# Patient Record
Sex: Male | Born: 1960 | Race: White | Hispanic: No | Marital: Married | Smoking: Never smoker
Health system: Southern US, Community
[De-identification: ages and names within clinical notes are randomized; demographics above are authoritative.]

## PROBLEM LIST (undated history)

## (undated) DIAGNOSIS — I499 Cardiac arrhythmia, unspecified: Secondary | ICD-10-CM

## (undated) DIAGNOSIS — G709 Myoneural disorder, unspecified: Secondary | ICD-10-CM

## (undated) DIAGNOSIS — M1990 Unspecified osteoarthritis, unspecified site: Secondary | ICD-10-CM

## (undated) DIAGNOSIS — Z9289 Personal history of other medical treatment: Secondary | ICD-10-CM

## (undated) DIAGNOSIS — I10 Essential (primary) hypertension: Secondary | ICD-10-CM

## (undated) DIAGNOSIS — R0683 Snoring: Secondary | ICD-10-CM

## (undated) DIAGNOSIS — K529 Noninfective gastroenteritis and colitis, unspecified: Secondary | ICD-10-CM

## (undated) DIAGNOSIS — K5790 Diverticulosis of intestine, part unspecified, without perforation or abscess without bleeding: Secondary | ICD-10-CM

## (undated) DIAGNOSIS — E785 Hyperlipidemia, unspecified: Secondary | ICD-10-CM

## (undated) HISTORY — PX: TONSILLECTOMY: SUR1361

## (undated) HISTORY — DX: Essential (primary) hypertension: I10

## (undated) HISTORY — DX: Hyperlipidemia, unspecified: E78.5

## (undated) HISTORY — DX: Diverticulosis of intestine, part unspecified, without perforation or abscess without bleeding: K57.90

## (undated) HISTORY — DX: Noninfective gastroenteritis and colitis, unspecified: K52.9

---

## 1991-10-15 HISTORY — PX: OPEN REDUCTION INTERNAL FIXATION (ORIF) FOOT LISFRANC FRACTURE: SHX5990

## 1997-10-14 HISTORY — PX: ANTERIOR CRUCIATE LIGAMENT REPAIR: SHX115

## 2005-10-14 HISTORY — PX: KNEE ARTHROSCOPY: SUR90

## 2006-09-16 ENCOUNTER — Ambulatory Visit: Payer: Self-pay | Admitting: Family Medicine

## 2006-09-22 ENCOUNTER — Ambulatory Visit: Payer: Self-pay | Admitting: Family Medicine

## 2006-09-22 LAB — CONVERTED CEMR LAB
Chol/HDL Ratio, serum: 4.9
Cholesterol: 180 mg/dL (ref 0–200)
Glucose, Bld: 92 mg/dL (ref 70–99)
HDL: 37.1 mg/dL — ABNORMAL LOW (ref >39.0)
LDL Cholesterol: 131 mg/dL — ABNORMAL HIGH (ref 0–99)
PSA: 0.58 ng/mL (ref 0.10–4.00)
Triglyceride fasting, serum: 60 mg/dL (ref 0–149)
VLDL: 12 mg/dL (ref 0–40)

## 2007-08-26 ENCOUNTER — Ambulatory Visit: Payer: Self-pay | Admitting: Family Medicine

## 2007-08-26 DIAGNOSIS — I10 Essential (primary) hypertension: Secondary | ICD-10-CM | POA: Insufficient documentation

## 2007-08-26 DIAGNOSIS — R9431 Abnormal electrocardiogram [ECG] [EKG]: Secondary | ICD-10-CM

## 2007-08-26 DIAGNOSIS — R209 Unspecified disturbances of skin sensation: Secondary | ICD-10-CM

## 2007-08-26 DIAGNOSIS — K648 Other hemorrhoids: Secondary | ICD-10-CM | POA: Insufficient documentation

## 2007-08-28 ENCOUNTER — Telehealth (INDEPENDENT_AMBULATORY_CARE_PROVIDER_SITE_OTHER): Payer: Self-pay | Admitting: *Deleted

## 2007-08-28 ENCOUNTER — Encounter (INDEPENDENT_AMBULATORY_CARE_PROVIDER_SITE_OTHER): Payer: Self-pay | Admitting: *Deleted

## 2007-08-28 DIAGNOSIS — K5730 Diverticulosis of large intestine without perforation or abscess without bleeding: Secondary | ICD-10-CM | POA: Insufficient documentation

## 2007-09-17 ENCOUNTER — Ambulatory Visit: Payer: Self-pay | Admitting: Cardiology

## 2007-09-24 ENCOUNTER — Ambulatory Visit: Payer: Self-pay | Admitting: Cardiology

## 2007-09-24 LAB — CONVERTED CEMR LAB
ALT: 19 units/L (ref 0–53)
AST: 22 units/L (ref 0–37)
Albumin: 4 g/dL (ref 3.5–5.2)
Alkaline Phosphatase: 56 units/L (ref 39–117)
BUN: 17 mg/dL (ref 6–23)
Bilirubin, Direct: 0.1 mg/dL (ref 0.0–0.3)
CO2: 27 meq/L (ref 19–32)
Calcium: 9.1 mg/dL (ref 8.4–10.5)
Chloride: 103 meq/L (ref 96–112)
Cholesterol: 163 mg/dL (ref 0–200)
Creatinine, Ser: 1.2 mg/dL (ref 0.4–1.5)
GFR calc Af Amer: 85 mL/min
GFR calc non Af Amer: 70 mL/min
Glucose, Bld: 100 mg/dL — ABNORMAL HIGH (ref 70–99)
HDL: 27.6 mg/dL — ABNORMAL LOW (ref >39.0)
LDL Cholesterol: 124 mg/dL — ABNORMAL HIGH (ref 0–99)
Potassium: 3.9 meq/L (ref 3.5–5.1)
Sodium: 136 meq/L (ref 135–145)
Total Bilirubin: 1.1 mg/dL (ref 0.3–1.2)
Total CHOL/HDL Ratio: 5.9
Total Protein: 6.7 g/dL (ref 6.0–8.3)
Triglycerides: 55 mg/dL (ref 0–149)
VLDL: 11 mg/dL (ref 0–40)

## 2007-09-29 ENCOUNTER — Ambulatory Visit: Payer: Self-pay | Admitting: Family Medicine

## 2007-09-29 ENCOUNTER — Telehealth (INDEPENDENT_AMBULATORY_CARE_PROVIDER_SITE_OTHER): Payer: Self-pay | Admitting: Family Medicine

## 2007-09-29 ENCOUNTER — Encounter (INDEPENDENT_AMBULATORY_CARE_PROVIDER_SITE_OTHER): Payer: Self-pay | Admitting: *Deleted

## 2007-09-29 ENCOUNTER — Encounter: Admission: RE | Admit: 2007-09-29 | Discharge: 2007-09-29 | Payer: Self-pay | Admitting: Family Medicine

## 2007-09-29 DIAGNOSIS — J069 Acute upper respiratory infection, unspecified: Secondary | ICD-10-CM | POA: Insufficient documentation

## 2007-09-29 DIAGNOSIS — N453 Epididymo-orchitis: Secondary | ICD-10-CM | POA: Insufficient documentation

## 2007-09-29 LAB — CONVERTED CEMR LAB
Bilirubin Urine: NEGATIVE
Glucose, Urine, Semiquant: NEGATIVE
Inflenza A Ag: NEGATIVE
Nitrite: POSITIVE
Protein, U semiquant: 30
Specific Gravity, Urine: >=1.03
Urobilinogen, UA: 2
pH: 7.5

## 2007-09-30 ENCOUNTER — Telehealth (INDEPENDENT_AMBULATORY_CARE_PROVIDER_SITE_OTHER): Payer: Self-pay | Admitting: *Deleted

## 2007-10-02 ENCOUNTER — Ambulatory Visit: Payer: Self-pay | Admitting: Family Medicine

## 2007-10-02 ENCOUNTER — Ambulatory Visit: Payer: Self-pay

## 2007-10-05 ENCOUNTER — Ambulatory Visit: Payer: Self-pay | Admitting: Family Medicine

## 2007-10-05 LAB — CONVERTED CEMR LAB
Bilirubin Urine: NEGATIVE
Blood in Urine, dipstick: NEGATIVE
Glucose, Urine, Semiquant: NEGATIVE
Ketones, urine, test strip: NEGATIVE
Nitrite: NEGATIVE
Protein, U semiquant: NEGATIVE
Specific Gravity, Urine: 1.02
Urobilinogen, UA: NEGATIVE
WBC Urine, dipstick: NEGATIVE
pH: 6.5

## 2007-10-13 ENCOUNTER — Encounter (INDEPENDENT_AMBULATORY_CARE_PROVIDER_SITE_OTHER): Payer: Self-pay | Admitting: *Deleted

## 2007-10-20 ENCOUNTER — Ambulatory Visit: Payer: Self-pay | Admitting: Family Medicine

## 2007-10-21 ENCOUNTER — Encounter (INDEPENDENT_AMBULATORY_CARE_PROVIDER_SITE_OTHER): Payer: Self-pay | Admitting: Family Medicine

## 2007-10-21 LAB — CONVERTED CEMR LAB
Bilirubin Urine: NEGATIVE
Hemoglobin, Urine: NEGATIVE
Ketones, ur: NEGATIVE mg/dL
Leukocytes, UA: NEGATIVE
Nitrite: NEGATIVE
Protein, ur: NEGATIVE mg/dL
Specific Gravity, Urine: 1.009 (ref 1.005–1.03)
Urine Glucose: NEGATIVE mg/dL
Urobilinogen, UA: 0.2 (ref 0.0–1.0)
pH: 8 (ref 5.0–8.0)

## 2007-10-22 ENCOUNTER — Telehealth (INDEPENDENT_AMBULATORY_CARE_PROVIDER_SITE_OTHER): Payer: Self-pay | Admitting: *Deleted

## 2007-10-23 ENCOUNTER — Encounter (INDEPENDENT_AMBULATORY_CARE_PROVIDER_SITE_OTHER): Payer: Self-pay | Admitting: *Deleted

## 2007-10-28 ENCOUNTER — Ambulatory Visit: Payer: Self-pay | Admitting: Family Medicine

## 2007-10-28 DIAGNOSIS — J019 Acute sinusitis, unspecified: Secondary | ICD-10-CM

## 2007-11-12 ENCOUNTER — Ambulatory Visit: Payer: Self-pay | Admitting: Cardiology

## 2008-07-01 ENCOUNTER — Ambulatory Visit: Payer: Self-pay | Admitting: Family Medicine

## 2008-07-01 DIAGNOSIS — J1189 Influenza due to unidentified influenza virus with other manifestations: Secondary | ICD-10-CM

## 2008-07-01 LAB — CONVERTED CEMR LAB
Inflenza A Ag: POSITIVE
Influenza B Ag: POSITIVE

## 2008-07-25 ENCOUNTER — Telehealth (INDEPENDENT_AMBULATORY_CARE_PROVIDER_SITE_OTHER): Payer: Self-pay | Admitting: *Deleted

## 2008-10-10 ENCOUNTER — Ambulatory Visit (HOSPITAL_BASED_OUTPATIENT_CLINIC_OR_DEPARTMENT_OTHER): Admission: RE | Admit: 2008-10-10 | Discharge: 2008-10-10 | Payer: Self-pay | Admitting: Orthopedic Surgery

## 2008-10-14 DIAGNOSIS — Z9289 Personal history of other medical treatment: Secondary | ICD-10-CM

## 2008-10-14 HISTORY — DX: Personal history of other medical treatment: Z92.89

## 2009-01-24 DIAGNOSIS — E785 Hyperlipidemia, unspecified: Secondary | ICD-10-CM | POA: Insufficient documentation

## 2009-02-20 ENCOUNTER — Telehealth: Payer: Self-pay | Admitting: Cardiology

## 2009-03-17 ENCOUNTER — Ambulatory Visit: Payer: Self-pay | Admitting: Family Medicine

## 2009-03-17 DIAGNOSIS — J309 Allergic rhinitis, unspecified: Secondary | ICD-10-CM | POA: Insufficient documentation

## 2009-04-05 ENCOUNTER — Ambulatory Visit: Payer: Self-pay | Admitting: Cardiology

## 2009-04-06 ENCOUNTER — Encounter (INDEPENDENT_AMBULATORY_CARE_PROVIDER_SITE_OTHER): Payer: Self-pay | Admitting: *Deleted

## 2009-04-12 ENCOUNTER — Ambulatory Visit: Payer: Self-pay

## 2009-04-12 ENCOUNTER — Encounter: Payer: Self-pay | Admitting: Cardiology

## 2009-06-08 IMAGING — US US SCROTUM
1 series · 13 of 25 positions shown · non-contrast
Comparison: none

CLINICAL DATA: Scrotal pain. 
 SCROTAL ULTRASOUND:
TECHNIQUE: Complete ultrasound examination of the testicles, epididymis, and other scrotal structures was performed.
TECHNIQUE: Color and duplex Doppler ultrasound was utilized to evaluate blood flow to the testicles.

[Series 1: us scrotum · 0.08mm/px · 13 of 55 slices shown]
[im 1/55]
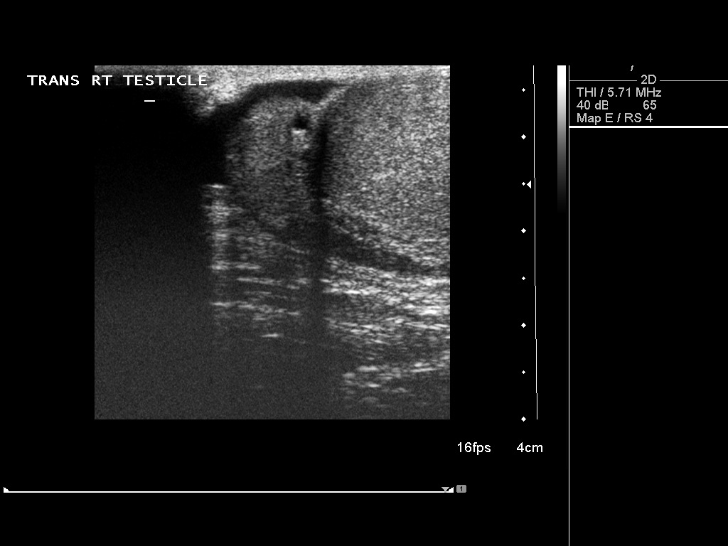
[im 5/55]
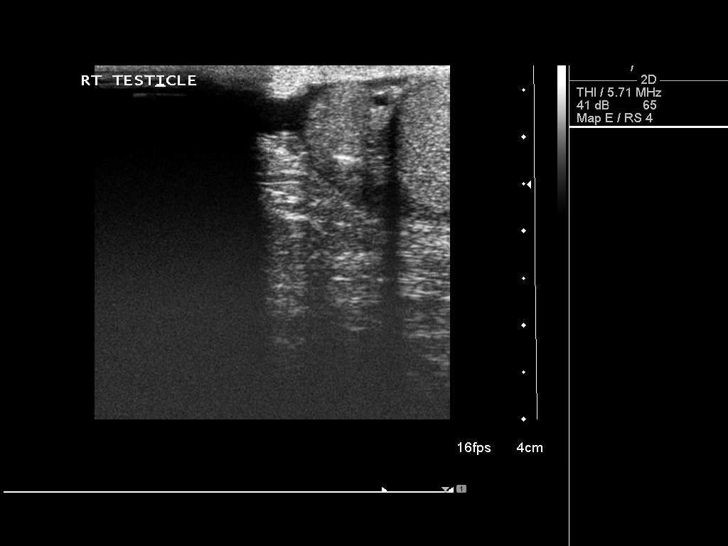
[im 10/55]
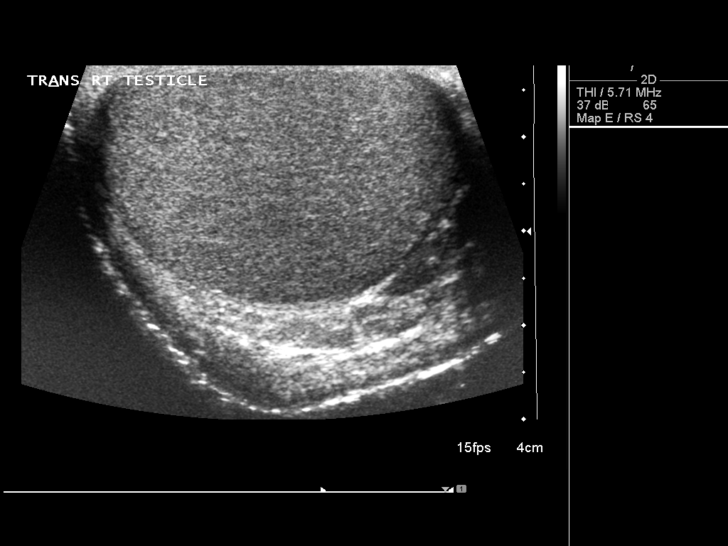
[im 14/55]
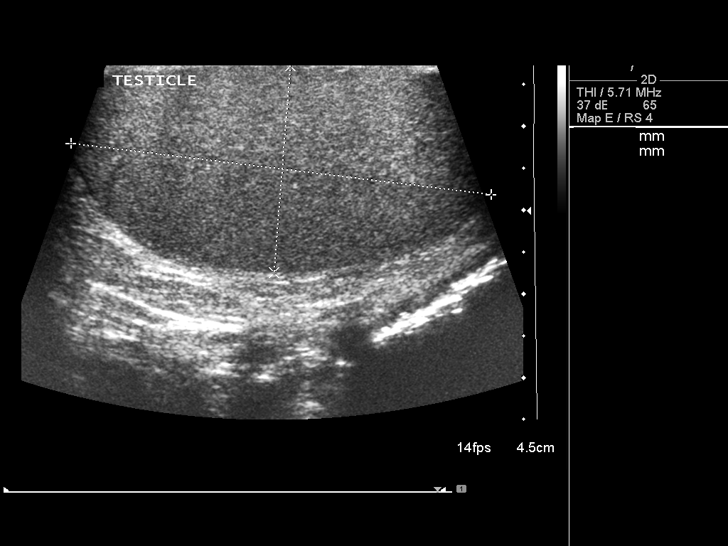
[im 19/55]
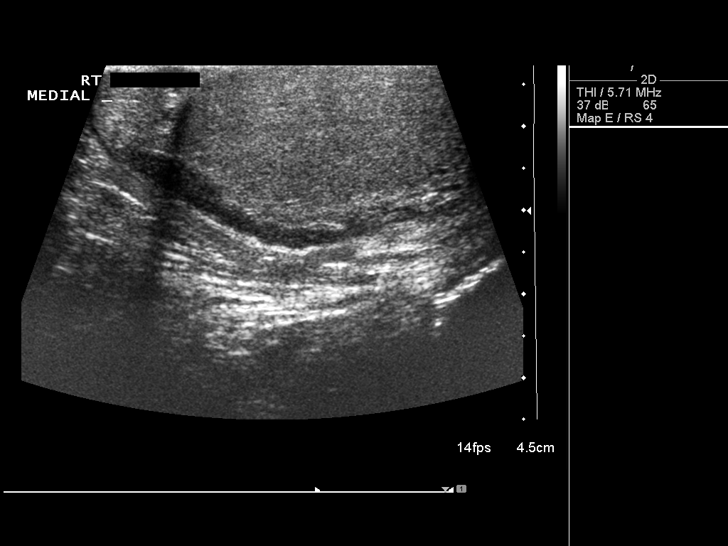
[im 23/55]
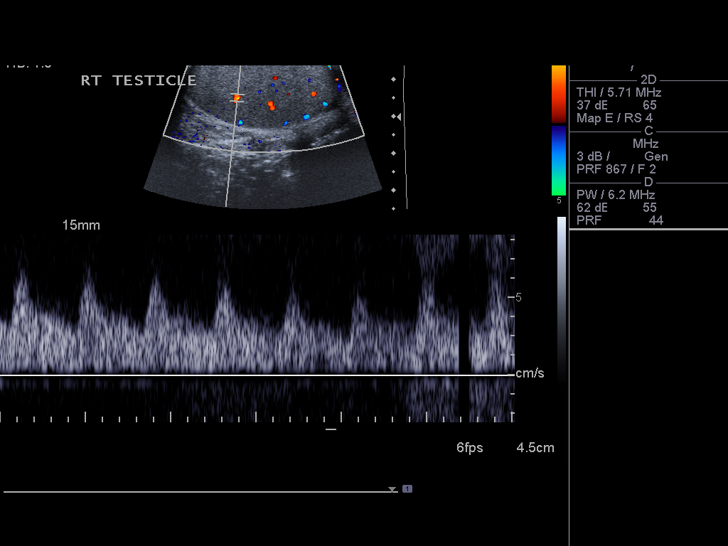
[im 28/55]
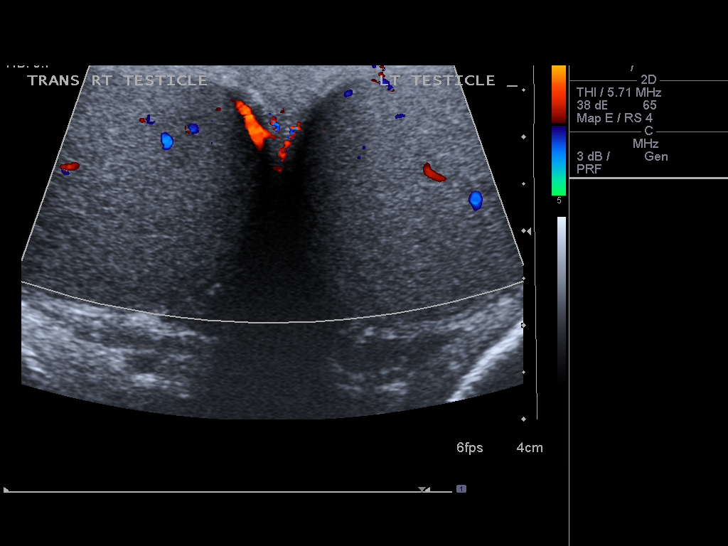
[im 32/55]
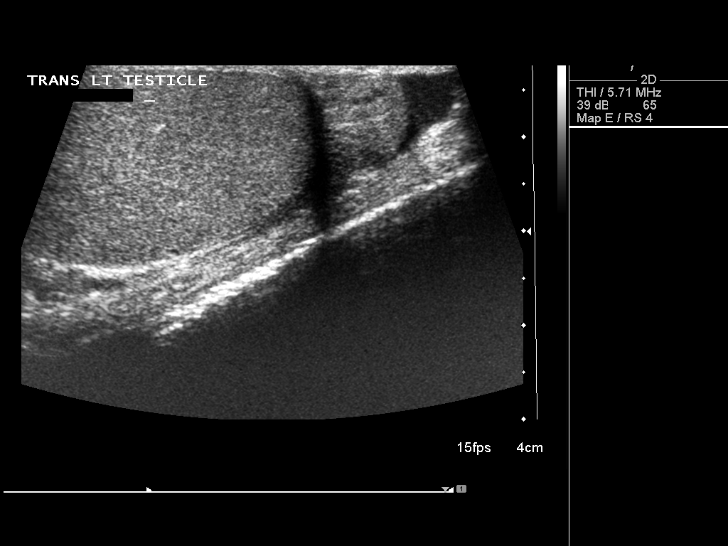
[im 37/55]
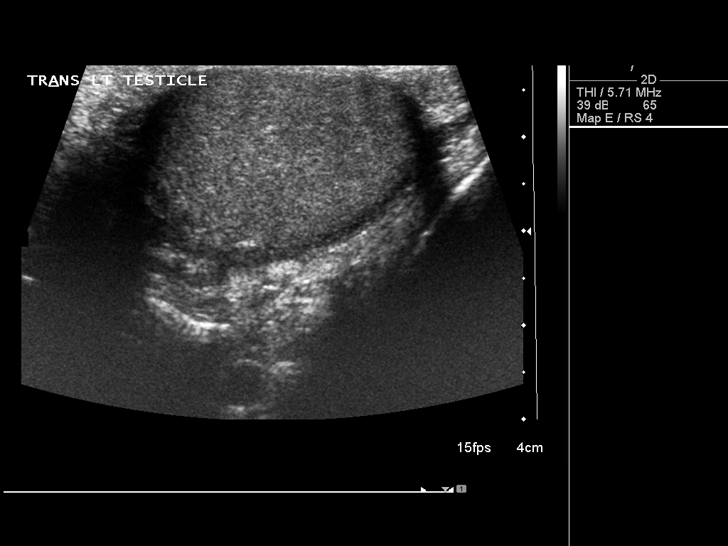
[im 41/55]
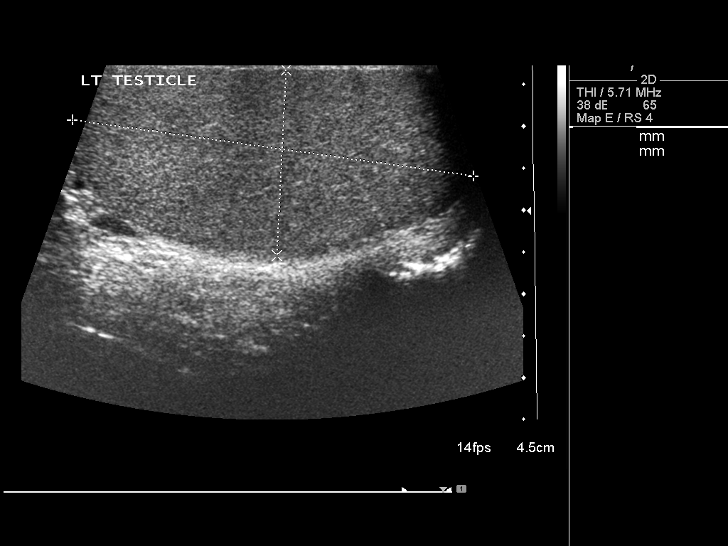
[im 46/55]
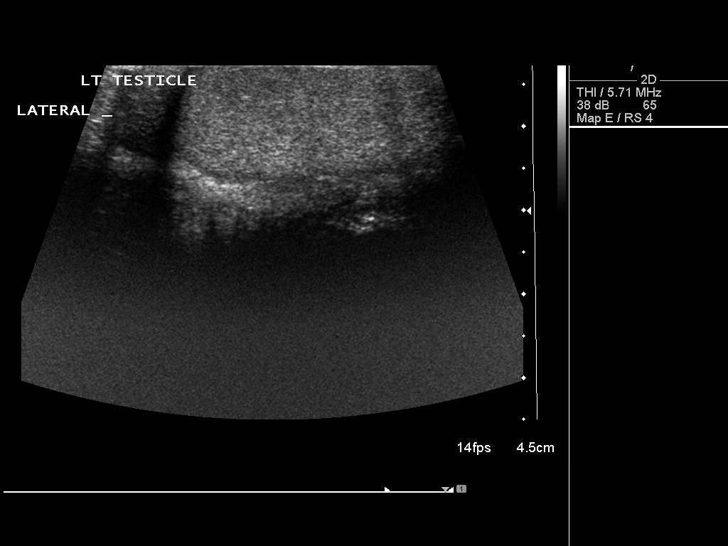
[im 50/55]
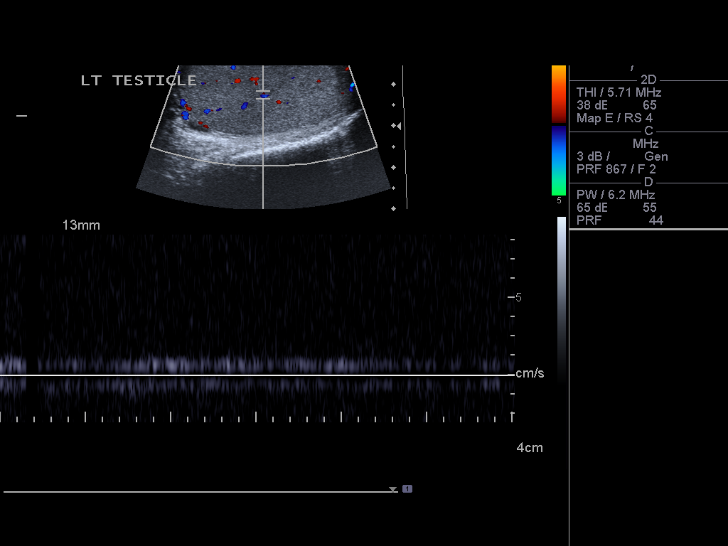
[im 55/55]
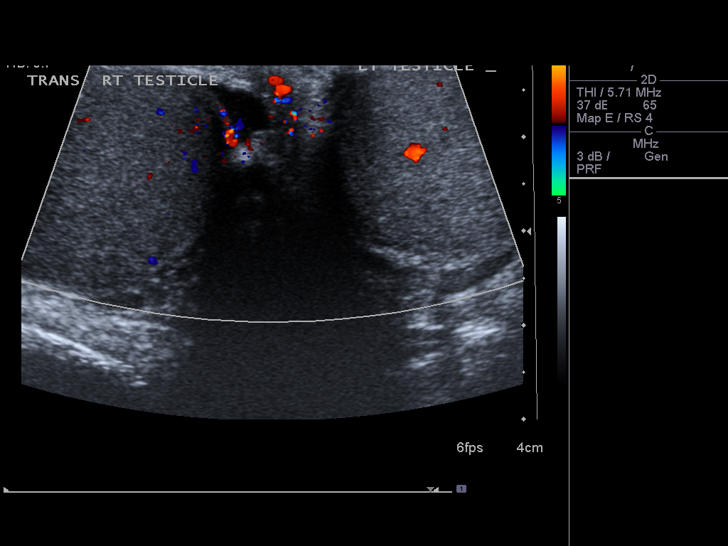

[13 of 25 positions shown; findings below may reference images not displayed]

FINDINGS: Bilateral testes demonstrate normal size with right measuring 5.1cm long x 2.5cm AP x 3.8cm wide and left 4.8cm long x 2.2cm AP x 3.8cm wide.  A small 3mm benign appearing cyst is seen at the upper pole right testis.  No other focal testicular lesions are seen.  Bilateral arterial venous ultrasound flow appears symmetrical and normal to testes.  Bilateral epididymal head demonstrates 2mm small benign appearing cyst at the right and otherwise sonographically normal.  No varicocele is seen.  No significant hydroceles are seen.
IMPRESSION: 1.  3mm likely benign upper pole right testicular cyst. 
 2.  2mm benign appearing cyst/spermatocele right epididymal head. 
 3.  Otherwise no significant abnormality. 
 ULTRASOUND ARTERIAL/VENOUS FLOW:
FINDINGS: Blood flow is seen within both testicles on color Doppler sonography.  Doppler spectral waveforms show both arterial and venous flow signal in both testicles.
IMPRESSION: No evidence of testicular torsion.

## 2009-10-02 ENCOUNTER — Ambulatory Visit: Payer: Self-pay | Admitting: Family

## 2009-10-02 DIAGNOSIS — M1990 Unspecified osteoarthritis, unspecified site: Secondary | ICD-10-CM | POA: Insufficient documentation

## 2009-12-01 ENCOUNTER — Telehealth (INDEPENDENT_AMBULATORY_CARE_PROVIDER_SITE_OTHER): Payer: Self-pay | Admitting: *Deleted

## 2009-12-12 ENCOUNTER — Ambulatory Visit: Payer: Self-pay | Admitting: Family Medicine

## 2010-03-15 ENCOUNTER — Telehealth: Payer: Self-pay | Admitting: Cardiology

## 2010-08-17 ENCOUNTER — Ambulatory Visit: Payer: Self-pay | Admitting: Family Medicine

## 2010-10-12 ENCOUNTER — Encounter: Payer: Self-pay | Admitting: Family Medicine

## 2010-10-12 ENCOUNTER — Ambulatory Visit: Payer: Self-pay | Admitting: Family Medicine

## 2010-10-12 DIAGNOSIS — M25519 Pain in unspecified shoulder: Secondary | ICD-10-CM

## 2010-10-12 LAB — CONVERTED CEMR LAB
Bilirubin Urine: NEGATIVE
Blood in Urine, dipstick: NEGATIVE
Glucose, Urine, Semiquant: NEGATIVE
Ketones, urine, test strip: NEGATIVE
Specific Gravity, Urine: <1.005
pH: 7.5

## 2010-10-16 LAB — CONVERTED CEMR LAB
AST: 5 units/L (ref 0–37)
BUN: 12 mg/dL (ref 6–23)
Basophils Absolute: 0 10*3/uL (ref 0.0–0.1)
Calcium: 9.5 mg/dL (ref 8.4–10.5)
Cholesterol: 226 mg/dL — ABNORMAL HIGH (ref 0–200)
Creatinine, Ser: 0.9 mg/dL (ref 0.4–1.5)
GFR calc non Af Amer: 92.34 mL/min (ref >60.00)
Glucose, Bld: 97 mg/dL (ref 70–99)
HCT: 44.3 % (ref 39.0–52.0)
HDL: 41.1 mg/dL (ref >39.00)
Lymphs Abs: 2.1 10*3/uL (ref 0.7–4.0)
Monocytes Relative: 7.8 % (ref 3.0–12.0)
PSA: 0.64 ng/mL (ref 0.10–4.00)
Platelets: 237 10*3/uL (ref 150.0–400.0)
Potassium: 4.7 meq/L (ref 3.5–5.1)
RDW: 12.6 % (ref 11.5–14.6)
TSH: 1.21 microintl units/mL (ref 0.35–5.50)
Total Bilirubin: 0.8 mg/dL (ref 0.3–1.2)
Triglycerides: 103 mg/dL (ref 0.0–149.0)

## 2010-10-19 ENCOUNTER — Ambulatory Visit
Admission: RE | Admit: 2010-10-19 | Discharge: 2010-10-19 | Payer: Self-pay | Source: Home / Self Care | Attending: Family Medicine | Admitting: Family Medicine

## 2010-10-19 ENCOUNTER — Other Ambulatory Visit: Payer: Self-pay | Admitting: Family Medicine

## 2010-10-19 LAB — FECAL OCCULT BLOOD, IMMUNOCHEMICAL: Fecal Occult Bld: NEGATIVE

## 2010-11-11 LAB — CONVERTED CEMR LAB
ALT: 24 units/L (ref 0–53)
AST: 29 units/L (ref 0–37)
Albumin: 4.3 g/dL (ref 3.5–5.2)
Alkaline Phosphatase: 56 units/L (ref 39–117)
BUN: 10 mg/dL (ref 6–23)
Basophils Absolute: 0 10*3/uL (ref 0.0–0.1)
Basophils Relative: 0.4 % (ref 0.0–1.0)
Bilirubin, Direct: 0.1 mg/dL (ref 0.0–0.3)
CO2: 27 meq/L (ref 19–32)
CO2: 29 meq/L (ref 19–32)
Calcium: 9.6 mg/dL (ref 8.4–10.5)
Calcium: 9.9 mg/dL (ref 8.4–10.5)
Chloride: 105 meq/L (ref 96–112)
Creatinine, Ser: 0.9 mg/dL (ref 0.4–1.5)
Creatinine, Ser: 1 mg/dL (ref 0.4–1.5)
Direct LDL: 177 mg/dL
Eosinophils Absolute: 0.3 10*3/uL (ref 0.0–0.6)
Eosinophils Relative: 4.3 % (ref 0.0–5.0)
GFR calc Af Amer: 104 mL/min
GFR calc non Af Amer: 86 mL/min
Glucose, Bld: 105 mg/dL — ABNORMAL HIGH (ref 70–99)
Glucose, Bld: 98 mg/dL (ref 70–99)
HCT: 43.1 % (ref 39.0–52.0)
Hemoglobin: 15.2 g/dL (ref 13.0–17.0)
Lymphocytes Relative: 34.3 % (ref 12.0–46.0)
MCHC: 35.3 g/dL (ref 30.0–36.0)
MCV: 94 fL (ref 78.0–100.0)
Magnesium: 1.9 mg/dL (ref 1.5–2.5)
Monocytes Absolute: 0.5 10*3/uL (ref 0.2–0.7)
Monocytes Relative: 7.7 % (ref 3.0–11.0)
Neutro Abs: 3.1 10*3/uL (ref 1.4–7.7)
Neutrophils Relative %: 53.3 % (ref 43.0–77.0)
Phosphorus: 4 mg/dL (ref 2.3–4.6)
Platelets: 238 10*3/uL (ref 150–400)
Potassium: 4.1 meq/L (ref 3.5–5.1)
RBC: 4.58 M/uL (ref 4.22–5.81)
RDW: 11.9 % (ref 11.5–14.6)
Sodium: 142 meq/L (ref 135–145)
Total Bilirubin: 1.1 mg/dL (ref 0.3–1.2)
Total Protein: 6.8 g/dL (ref 6.0–8.3)
WBC: 5.9 10*3/uL (ref 4.5–10.5)

## 2010-11-13 NOTE — Assessment & Plan Note (Signed)
Summary: SINUS INFECTION,[employer]/RH.....   Vital Signs:  Patient profile:   50 year old male Weight:      271 pounds Temp:     98.3 degrees F oral Pulse rate:   72 / minute Pulse rhythm:   regular BP sitting:   128 / 84  (left arm) Cuff size:   large  Vitals Entered By: Army Fossa CMA (December 12, 2009 2:07 PM) CC: Pt c/o sinus pressure, HA, head congestion x 4 days. , URI symptoms   History of Present Illness:       This is a 50 year old man who presents with URI symptoms.  The symptoms began 4 days ago.  The patient complains of nasal congestion and purulent nasal discharge, but denies clear nasal discharge, sore throat, dry cough, productive cough, earache, and sick contacts.  The patient denies fever, low-grade fever (<100.5 degrees), fever of 100.5-103 degrees, fever of 103.1-104 degrees, fever to >104 degrees, stiff neck, dyspnea, wheezing, rash, vomiting, diarrhea, use of an antipyretic, and response to antipyretic.  The patient also reports headache and muscle aches.  The patient denies the following risk factors for Strep sinusitis: unilateral facial pain, unilateral nasal discharge, poor response to decongestant, double sickening, tooth pain, Strep exposure, tender adenopathy, and absence of cough.  Pt used neti pot ,  equate sinus medication.    Current Medications (verified): 1)  Lisinopril 20 Mg  Tabs (Lisinopril) .... Take One Tablet Daily 2)  Meloxicam 15 Mg  Tabs (Meloxicam) .... Take Once Daily 3)  Fluticasone Propionate 50 Mcg/act  Susp (Fluticasone Propionate) .... 2 Sprays Each Nostril Once Daily 4)  Multivitamins   Tabs (Multiple Vitamin) .Marland Kitchen.. 1 Tab By Mouth Once Daily 5)  Ibuprofen 600 Mg Tabs (Ibuprofen) .... As Needed 6)  Augmentin 875-125 Mg Tabs (Amoxicillin-Pot Clavulanate) .Marland Kitchen.. 1 By Mouth Two Times A Day  Allergies (verified): No Known Drug Allergies  Past History:  Past medical, surgical, family and social histories (including risk factors) reviewed  for relevance to current acute and chronic problems.  Past Medical History: Reviewed history from 04/05/2009 and no changes required. HYPERLIPIDEMIA-MIXED (ICD-272.4) INFLUENZA (ICD-487.8) SINUSITIS- ACUTE-NOS (ICD-461.9) EPIDIDYMITIS (ICD-604.90) URI (ICD-465.9) HEMORRHOIDS, INTERNAL (ICD-455.0) DIVERTICULOSIS, COLON (ICD-562.10) PARESTHESIA, HANDS (ICD-782.0)  ELECTROCARDIOGRAM, ABNORMAL (ICD-794.31) HYPERTENSION, BENIGN ESSENTIAL (ICD-401.1)    Past Surgical History: Reviewed history from 08/26/2007 and no changes required. left ACL repair in 1999  Family History: Reviewed history from 08/26/2007 and no changes required. father passed away with the history of a CVA  mother passed away with a history of hypertension and brain aneurysm brother passed away with a history of AIDS past 3 brothers alive and well  Social History: Reviewed history from 08/26/2007 and no changes required. 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   Mouth:  Oral mucosa and oropharynx without lesions or exudates.  Teeth in good repair. Neck:  No deformities, masses, or tenderness noted. Lungs:  Normal respiratory effort, chest expands symmetrically. Lungs are clear to auscultation, no crackles or wheezes. Heart:  Normal rate and regular rhythm. S1 and S2 normal without gallop, murmur, click, rub or other extra sounds. Skin:  Intact without suspicious lesions or rashes Cervical  Nodes:  No lymphadenopathy noted Psych:  Cognition and judgment appear intact. Alert and cooperative with normal attention span and concentration. No  apparent delusions, illusions, hallucinations   Impression & Recommendations:  Problem # 1:  SINUSITIS- ACUTE-NOS (ICD-461.9)  The following medications were removed from the medication list:    Azithromycin 250 Mg Tabs (Azithromycin) .Marland Kitchen..Marland Kitchen Two tablets by mouth today, then one tablet by mouth daily x 4 additional days His updated medication list for this problem includes:    Fluticasone Propionate 50 Mcg/act Susp (Fluticasone propionate) .Marland Kitchen... 2 sprays each nostril once daily    Augmentin 875-125 Mg Tabs (Amoxicillin-pot clavulanate) .Marland Kitchen... 1 by mouth two times a day  Complete Medication List: 1)  Lisinopril 20 Mg Tabs (Lisinopril) .... Take one tablet daily 2)  Meloxicam 15 Mg Tabs (Meloxicam) .... Take once daily 3)  Fluticasone Propionate 50 Mcg/act Susp (Fluticasone propionate) .... 2 sprays each nostril once daily 4)  Multivitamins Tabs (Multiple vitamin) .Marland Kitchen.. 1 tab by mouth once daily 5)  Ibuprofen 600 Mg Tabs (Ibuprofen) .... As needed 6)  Augmentin 875-125 Mg Tabs (Amoxicillin-pot clavulanate) .Marland Kitchen.. 1 by mouth two times a day Prescriptions: AUGMENTIN 875-125 MG TABS (AMOXICILLIN-POT CLAVULANATE) 1 by mouth two times a day  #20 x 0   Entered and Authorized by:   Loreen Freud DO   Signed by:   Loreen Freud DO on 12/12/2009   Method used:   Electronically to        [facility] [address] (retail)       [address] [place], [place]  [address]       Ph: [phone] or [phone]       Fax: [phone]   RxID:   [id number] FLUTICASONE PROPIONATE 50 MCG/ACT  SUSP (FLUTICASONE PROPIONATE) 2 sprays each nostril once daily  #1 x 3   Entered and Authorized by:   Loreen Freud DO   Signed by:   Loreen Freud DO on 12/12/2009   Method used:   Electronically to        [facility] [address] (retail)       [address] [place], [place]  [address]       Ph: [phone] or [phone]       Fax: [phone]   RxID:    [id number]

## 2010-11-13 NOTE — Progress Notes (Signed)
Summary: refill  Phone Note Refill Request Message from:  Patient on March 15, 2010 8:16 AM  Refills Requested: Medication #1:  LISINOPRIL 20 MG  TABS Take one tablet daily [facility] [phone]  Initial call taken by: Judie Grieve,  March 15, 2010 8:16 AM    Prescriptions: LISINOPRIL 20 MG  TABS (LISINOPRIL) Take one tablet daily  #30 x 12   Entered by:   Kem Parkinson   Authorized by:   Ferman Hamming, MD, [facility]   Signed by:   Kem Parkinson on 03/15/2010   Method used:   [facility] to        [facility] [phone]* (retail)       [address]       [place], [place]  [address]       Ph: [phone]       Fax: [phone]   RxID:   [id number]

## 2010-11-13 NOTE — Assessment & Plan Note (Signed)
Summary: allergy?/cbs   Vital Signs:  Patient profile:   50 year old male Height:      70 inches Weight:      268.6 pounds BMI:     38.68 O2 Sat:      98 % on Room air Temp:     98.5 degrees F oral Pulse rate:   63 / minute Pulse rhythm:   regular BP sitting:   116 / 82  (left arm) Cuff size:   large  Vitals Entered By: Almeta Monas CMA Duncan Dull) (August 17, 2010 1:40 PM)  O2 Flow:  Room air CC: c/o cough with yellow phelgm, sinus pressure/pain, sore in the right nostril, URI symptoms   History of Present Illness:       This is a 50 year old man who presents with URI symptoms.  The symptoms began 3 weeks ago.  The patient complains of nasal congestion and purulent nasal discharge.  The patient denies fever, low-grade fever (<100.5 degrees), fever of 100.5-103 degrees, fever of 103.1-104 degrees, fever to >104 degrees, stiff neck, dyspnea, wheezing, rash, vomiting, diarrhea, use of an antipyretic, and response to antipyretic.  The patient also reports headache.  The patient denies itchy watery eyes, itchy throat, sneezing, seasonal symptoms, response to antihistamine, muscle aches, and severe fatigue.  Risk factors for Strep sinusitis include unilateral facial pain and unilateral nasal discharge.  The patient denies the following risk factors for Strep sinusitis: poor response to decongestant, double sickening, tooth pain, Strep exposure, tender adenopathy, and absence of cough.  Pt taking tylenol only and some sinus medication with no relief.    Current Medications (verified): 1)  Lisinopril 20 Mg  Tabs (Lisinopril) .... Take One Tablet Daily 2)  Meloxicam 15 Mg  Tabs (Meloxicam) .... Take Once Daily 3)  Fluticasone Propionate 50 Mcg/act  Susp (Fluticasone Propionate) .... 2 Sprays Each Nostril Once Daily 4)  Multivitamins   Tabs (Multiple Vitamin) .Marland Kitchen.. 1 Tab By Mouth Once Daily 5)  Ibuprofen 600 Mg Tabs (Ibuprofen) .... As Needed 6)  Augmentin 875-125 Mg Tabs (Amoxicillin-Pot  Clavulanate) .Marland Kitchen.. 1 By Mouth Two Times A Day  Allergies (verified): No Known Drug Allergies  Past History:  Past medical, surgical, family and social histories (including risk factors) reviewed for relevance to current acute and chronic problems.  Past Medical History: Reviewed history from 04/05/2009 and no changes required. HYPERLIPIDEMIA-MIXED (ICD-272.4) INFLUENZA (ICD-487.8) SINUSITIS- ACUTE-NOS (ICD-461.9) EPIDIDYMITIS (ICD-604.90) URI (ICD-465.9) HEMORRHOIDS, INTERNAL (ICD-455.0) DIVERTICULOSIS, COLON (ICD-562.10) PARESTHESIA, HANDS (ICD-782.0)  ELECTROCARDIOGRAM, ABNORMAL (ICD-794.31) HYPERTENSION, BENIGN ESSENTIAL (ICD-401.1)    Past Surgical History: Reviewed history from 08/26/2007 and no changes required. left ACL repair in 1999  Family History: Reviewed history from 08/26/2007 and no changes required. father passed away with the history of a CVA  mother passed away with a history of hypertension and brain aneurysm brother passed away with a history of AIDS past 3 brothers alive and well  Social History: Reviewed history from 08/26/2007 and no changes required. 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   Mouth:  pharyngeal erythema and pharyngeal exudate.   Neck:  No deformities, masses, or tenderness noted. Lungs:  Normal respiratory effort, chest expands symmetrically. Lungs are clear to auscultation, no crackles or wheezes. Heart:  normal rate, regular rhythm, and no murmur.   Extremities:  No  clubbing, cyanosis, edema, or deformity noted with normal full range of motion of all joints.   Skin:  Intact without suspicious lesions or rashes Cervical Nodes:  No lymphadenopathy noted Psych:  Cognition and judgment appear intact. Alert and cooperative with normal attention span and concentration. No apparent delusions, illusions, hallucinations   Impression & Recommendations:  Problem # 1:  SINUSITIS- ACUTE-NOS (ICD-461.9)  His updated medication list for this problem includes:    Fluticasone Propionate 50 Mcg/act Susp (Fluticasone propionate) .Marland Kitchen... 2 sprays each nostril once daily    Augmentin 875-125 Mg Tabs (Amoxicillin-pot clavulanate) .Marland Kitchen... 1 by mouth two times a day  Instructed on treatment. Call if symptoms persist or worsen.   Complete Medication List: 1)  Lisinopril 20 Mg Tabs (Lisinopril) .... Take one tablet daily 2)  Meloxicam 15 Mg Tabs (Meloxicam) .... Take once daily 3)  Fluticasone Propionate 50 Mcg/act Susp (Fluticasone propionate) .... 2 sprays each nostril once daily 4)  Multivitamins Tabs (Multiple vitamin) .Marland Kitchen.. 1 tab by mouth once daily 5)  Ibuprofen 600 Mg Tabs (Ibuprofen) .... As needed 6)  Augmentin 875-125 Mg Tabs (Amoxicillin-pot clavulanate) .Marland Kitchen.. 1 by mouth two times a day  Patient Instructions: 1)  Acute sinusitis symptoms for less than 10 days are not helped by antibiotics. Use warm moist compresses, and over the counter decongestants( only as directed). Call if no improvement in 5-7 days, sooner if increasing pain, fever, or new symptoms.  Prescriptions: AUGMENTIN 875-125 MG TABS (AMOXICILLIN-POT CLAVULANATE) 1 by mouth two times a day  #20 x 0   Entered and Authorized by:   Loreen Freud DO   Signed by:   Loreen Freud DO on 08/17/2010   Method used:   [facility] to        [facility] [phone]* (retail)       [address]       [place], [place]  [address]       Ph: [phone]       Fax: [phone]   RxID:    [id number] FLUTICASONE PROPIONATE 50 MCG/ACT  SUSP (FLUTICASONE PROPIONATE) 2 sprays each nostril once daily  #1 x 5   Entered and Authorized by:   Loreen Freud DO   Signed by:   Loreen Freud DO on 08/17/2010   Method used:   Electronically to        [facility] [phone]* (retail)       [address]       [place], [place]  [address]       Ph: [phone]       Fax: [phone]   RxID:   [id number]    Orders Added: 1)  Est. Patient Level III [16967]

## 2010-11-13 NOTE — Progress Notes (Signed)
Summary: meloxicam- denied   Phone Note Refill Request Message from:  Fax from Pharmacy on December 01, 2009 10:25 AM  Refills Requested: Medication #1:  MELOXICAM 15 MG  TABS Take once daily   Dosage confirmed as above?Dosage Confirmed   Brand Name Necessary? No   Supply Requested: 1 month   Last Refilled: 10/31/2009  Method Requested: Electronic Next Appointment Scheduled: none Initial call taken by: Roselle Locus,  December 01, 2009 10:26 AM  Follow-up for Phone Call        this med was given at acute visit b/c he was out he sees Dr. Turner Daniels so he needs to contact there office.Marland KitchenMarland KitchenMarland KitchenDoristine Devoid  December 01, 2009 11:12 AM

## 2010-11-15 NOTE — Assessment & Plan Note (Signed)
Summary: cpx/lab/cbs   Vital Signs:  Patient profile:   50 year old male Height:      70 inches Weight:      274.4 pounds BMI:     39.51 Pulse rate:   68 / minute Pulse rhythm:   regular BP sitting:   120 / 78  (right arm) Cuff size:   large  Vitals Entered By: Almeta Monas CMA Duncan Dull) (October 12, 2010 9:03 AM) CC: CPX/Fasting--needs referral for left shoulder injury  Vision Screening:Left eye with correction: 20 / 15 Right eye with correction: 20 / 15 Both eyes with correction: 20 / 15  Color vision testing: normal     Vision Comments: patient wears glasses  Vision Entered By: Almeta Monas CMA Duncan Dull) (October 12, 2010 9:17 AM)   History of Present Illness: Pt here for cpe---only complaint is L shoulder pain that occurred during football practice in fall.  It is not getting better.  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  He did rest it but did not take anything else.  He normally takes Mobic daily.  No other complaints.    Hyperlipidemia follow-up      This is a 50 year old man who presents for Hyperlipidemia follow-up.  The patient denies muscle aches, GI upset, abdominal pain, flushing, itching, constipation, diarrhea, and fatigue.  The patient denies the following symptoms: chest pain/pressure, exercise intolerance, dypsnea, palpitations, syncope, and pedal edema.  Compliance with medications (by patient report) has been near 100%.  Dietary compliance has been good.  The patient reports exercising daily.  Adjunctive measures currently used by the patient include ASA.    Hypertension follow-up      The patient also presents for Hypertension follow-up.  The patient denies lightheadedness, urinary frequency, headaches, edema, impotence, rash, and fatigue.  The patient denies the following associated symptoms: chest pain, chest pressure, exercise intolerance, dyspnea, palpitations, syncope, leg edema, and pedal edema.  Compliance with  medications (by patient report) has been near 100%.  The patient reports that dietary compliance has been good.  The patient reports exercising daily.  Adjunctive measures currently used by the patient include salt restriction.    Preventive Screening-Counseling & Management  Alcohol-Tobacco     Alcohol drinks/day: <1     Alcohol type: beer     Smoking Status: never  Caffeine-Diet-Exercise     Caffeine use/day: 3     Does Patient Exercise: yes     Type of exercise: gym     Exercise (avg: min/session): 30-60     Times/week: 5  Hep-HIV-STD-Contraception     Dental Visit-last 6 months yes     Dental Care Counseling: not indicated; dental care within six months      Sexual History:  currently monogamous.        Drug Use:  never.    Problems Prior to Update: 1)  Preventive Health Care  (ICD-V70.0) 2)  Family History of Alcoholism/addiction  (ICD-V61.41) 3)  Shoulder Pain, Left  (ICD-719.41) 4)  Degenerative Joint Disease  (ICD-715.90) 5)  Abnormal Electrocardiogram  (ICD-794.31) 6)  Rhinitis  (ICD-477.9) 7)  Hyperlipidemia-mixed  (ICD-272.4) 8)  Influenza  (ICD-487.8) 9)  Sinusitis- Acute-nos  (ICD-461.9) 10)  Epididymitis  (ICD-604.90) 11)  Uri  (ICD-465.9) 12)  Hemorrhoids, Internal  (ICD-455.0) 13)  Diverticulosis, Colon  (ICD-562.10) 14)  Paresthesia, Hands  (ICD-782.0) 15)  Stress Electrocardiogram, Abnormal  (ICD-794.31) 16)  Hypertension, Benign Essential  (ICD-401.1)  Medications Prior to Update:  1)  Lisinopril 20 Mg  Tabs (Lisinopril) .... Take One Tablet Daily 2)  Meloxicam 15 Mg  Tabs (Meloxicam) .... Take Once Daily 3)  Fluticasone Propionate 50 Mcg/act  Susp (Fluticasone Propionate) .... 2 Sprays Each Nostril Once Daily 4)  Multivitamins   Tabs (Multiple Vitamin) .Marland Kitchen.. 1 Tab By Mouth Once Daily 5)  Ibuprofen 600 Mg Tabs (Ibuprofen) .... As Needed  Current Medications (verified): 1)  Lisinopril 20 Mg  Tabs (Lisinopril) .... Take One Tablet Daily 2)  Meloxicam  15 Mg  Tabs (Meloxicam) .... Take Once Daily 3)  Fluticasone Propionate 50 Mcg/act  Susp (Fluticasone Propionate) .... 2 Sprays Each Nostril Once Daily 4)  Multivitamins   Tabs (Multiple Vitamin) .Marland Kitchen.. 1 Tab By Mouth Once Daily 5)  Acetaminophen 500 Mg Tabs (Acetaminophen) .... By Mouth As Needed  Allergies (verified): No Known Drug Allergies  Past History:  Past Medical History: Last updated: 04/05/2009 HYPERLIPIDEMIA-MIXED (ICD-272.4) INFLUENZA (ICD-487.8) SINUSITIS- ACUTE-NOS (ICD-461.9) EPIDIDYMITIS (ICD-604.90) URI (ICD-465.9) HEMORRHOIDS, INTERNAL (ICD-455.0) DIVERTICULOSIS, COLON (ICD-562.10) PARESTHESIA, HANDS (ICD-782.0)  ELECTROCARDIOGRAM, ABNORMAL (ICD-794.31) HYPERTENSION, BENIGN ESSENTIAL (ICD-401.1)    Past Surgical History: Last updated: 08/26/2007 left ACL repair in 1999  Family History: Last updated: 2010-11-01 father passed away with the history of a CVA  mother passed away with a history of hypertension and brain aneurysm brother passed away with a history of AIDS past 3 brothers alive and well Family History Hypertension Family History of Stroke M 1st degree relative 48yo Family History of Alcoholism/Addiction  Social History: Last updated: 08/26/2007 Occupation:PE teacher at [employer] Married Never Smoked Alcohol use-yes Drug use-no Regular exercise-yes  Risk Factors: Alcohol Use: <1 (Nov 01, 2010) Caffeine Use: 3 (2010/11/01) Exercise: yes (01-Nov-2010)  Risk Factors: Smoking Status: never (Nov 01, 2010)  Family History: Reviewed history from 08/26/2007 and no changes required. father passed away with the history of a CVA  mother passed away with a history of hypertension and brain aneurysm brother passed away with a history of AIDS past 3 brothers alive and well Family History Hypertension Family History of Stroke M 1st degree relative 48yo Family History of Alcoholism/Addiction  Social History: Reviewed history from  08/26/2007 and no changes required. 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 Eyes:  Denies blurring, discharge, double vision, eye irritation, eye pain, halos, itching, light sensitivity, red eye, vision loss-1 eye, and vision loss-both eyes; optho q1y--+ contacts. ENT:  Denies decreased hearing, difficulty swallowing, ear discharge, earache, hoarseness, nasal congestion, nosebleeds, postnasal drainage, ringing in ears, sinus pressure, and sore throat. CV:  Denies bluish discoloration of lips or nails, chest pain or discomfort, difficulty breathing at night, difficulty breathing while lying down, fainting, fatigue, leg cramps with exertion, lightheadness, near fainting, palpitations, shortness of breath with exertion, swelling of feet, swelling of hands, and weight gain. Resp:  Denies chest discomfort, chest pain with inspiration, cough, coughing up blood, excessive snoring, hypersomnolence, morning headaches, pleuritic, shortness of breath, sputum productive, and wheezing. GI:  Denies abdominal pain, bloody stools, change in bowel habits, constipation, dark tarry stools, diarrhea, excessive appetite, gas, hemorrhoids, indigestion, loss of appetite, nausea, vomiting, vomiting blood, and yellowish skin color. GU:  Denies decreased libido, discharge, dysuria, erectile dysfunction,  genital sores, hematuria, incontinence, nocturia, urinary frequency, and urinary hesitancy. MS:  Complains of joint pain; denies joint redness, joint swelling, loss of strength, low back pain, mid back pain, muscle aches, muscle , cramps, muscle weakness, stiffness, and thoracic pain. Derm:   Denies changes in color of skin, changes in nail beds, dryness, excessive perspiration, flushing, hair loss, insect bite(s), itching, lesion(s), poor wound healing, and rash. Neuro:  Denies brief paralysis, difficulty with concentration, disturbances in coordination, falling down, headaches, inability to speak, memory loss, numbness, poor balance, seizures, sensation of room spinning, tingling, tremors, visual disturbances, and weakness. Psych:  Denies alternate hallucination ( auditory/visual), anxiety, depression, easily angered, easily tearful, irritability, mental problems, panic attacks, sense of great danger, suicidal thoughts/plans, thoughts of violence, unusual visions or sounds, and thoughts /plans of harming others. Endo:  Denies cold intolerance, excessive hunger, excessive thirst, excessive urination, heat intolerance, polyuria, and weight change. Heme:  Denies abnormal bruising, bleeding, enlarge lymph nodes, fevers, pallor, and skin discoloration. Allergy:  Denies hives or rash, itching eyes, persistent infections, seasonal allergies, and sneezing.  Physical Exam  General:  Well-developed,well-nourished,in no acute distress; alert,appropriate and cooperative throughout examination Head:  Normocephalic and atraumatic without obvious abnormalities. No apparent alopecia or balding. Eyes:  pupils equal, pupils round, pupils reactive to light, and no injection.   Ears:  External ear exam shows no significant lesions or deformities.  Otoscopic examination reveals clear canals, tympanic membranes are intact bilaterally without bulging, retraction, inflammation or discharge. Hearing is grossly normal bilaterally. Nose:  External nasal examination shows no deformity or inflammation. Nasal mucosa are pink and moist without lesions or exudates. Mouth:  Oral mucosa and oropharynx without lesions or exudates.  Teeth in good repair. Neck:  No deformities, masses, or tenderness noted. Chest Wall:  No  deformities, masses, tenderness or gynecomastia noted. Lungs:  Normal respiratory effort, chest expands symmetrically. Lungs are clear to auscultation, no crackles or wheezes. Heart:  normal rate and no murmur.   Abdomen:  Bowel sounds positive,abdomen soft and non-tender without masses, organomegaly or hernias noted. Rectal:  No external abnormalities noted. Normal sphincter tone. No rectal masses or tenderness. Genitalia:  Testes bilaterally descended without nodularity, tenderness or masses. No scrotal masses or lesions. No penis lesions or urethral discharge. Prostate:  Prostate gland firm and smooth, no enlargement, nodularity, tenderness, mass, asymmetry or induration. Msk:  normal ROM, no joint swelling, no joint warmth, no redness over joints, no joint deformities, no joint instability, no crepitation, and joint tenderness in Left shoulder.  Pulses:  R posterior tibial normal, R dorsalis pedis normal, R carotid normal, L posterior tibial normal, L dorsalis pedis normal, and L carotid normal.   Extremities:  No clubbing, cyanosis, edema, or deformity noted with normal full range of motion of all joints.   Neurologic:  No cranial nerve deficits noted. Station and gait are normal. Plantar reflexes are down-going bilaterally. DTRs are symmetrical throughout. Sensory, motor and coordinative functions appear intact. Skin:  Intact without suspicious lesions or rashes Cervical Nodes:  No lymphadenopathy noted Axillary Nodes:  No palpable lymphadenopathy Psych:  Cognition and judgment appear intact. Alert and cooperative with normal attention span and concentration. No apparent delusions, illusions, hallucinations   Impression & Recommendations:  Problem # 1:  PREVENTIVE HEALTH CARE (ICD-V70.0)  Orders: Venipuncture (60454) TLB-Lipid Panel (80061-LIPID) TLB-BMP (Basic Metabolic Panel-BMET) (80048-METABOL) TLB-CBC Platelet - w/Differential (85025-CBCD) TLB-Hepatic/Liver Function Pnl  (80076-HEPATIC) TLB-TSH (Thyroid Stimulating Hormone) (84443-TSH) TLB-PSA (Prostate Specific Antigen) (84153-PSA) EKG w/ Interpretation (93000)  Reviewed preventive care protocols, scheduled due services, and updated immunizations.  Problem # 2:  SHOULDER PAIN, LEFT (ICD-719.41)  His updated medication list for this problem includes:    Meloxicam 15 Mg Tabs (Meloxicam) .Marland Kitchen... Take once daily    Acetaminophen 500 Mg Tabs (Acetaminophen) ..... By mouth as needed  Orders: Orthopedic Surgeon Referral (Ortho Surgeon) EKG w/ Interpretation (93000)  Discussed shoulder exercises, use of moist heat or ice, and medication.   Problem # 3:  HYPERLIPIDEMIA-MIXED (ICD-272.4)  Orders: Venipuncture (40981) TLB-Lipid Panel (80061-LIPID) TLB-BMP (Basic Metabolic Panel-BMET) (80048-METABOL) TLB-CBC Platelet - w/Differential (85025-CBCD) TLB-Hepatic/Liver Function Pnl (80076-HEPATIC) TLB-TSH (Thyroid Stimulating Hormone) (84443-TSH) TLB-PSA (Prostate Specific Antigen) (84153-PSA) EKG w/ Interpretation (93000)  Labs Reviewed: SGOT: 22 (09/24/2007)   SGPT: 19 (09/24/2007)   HDL:41.10 (04/05/2009), 27.6 (09/24/2007)  LDL:124 (09/24/2007), 131 (09/22/2006)  Chol:234 (04/05/2009), 163 (09/24/2007)  Trig:105.0 (04/05/2009), 55 (09/24/2007)  Problem # 4:  HYPERTENSION, BENIGN ESSENTIAL (ICD-401.1)  His updated medication list for this problem includes:    Lisinopril 20 Mg Tabs (Lisinopril) .Marland Kitchen... Take one tablet daily  Orders: Venipuncture (19147) TLB-Lipid Panel (80061-LIPID) TLB-BMP (Basic Metabolic Panel-BMET) (80048-METABOL) TLB-CBC Platelet - w/Differential (85025-CBCD) TLB-Hepatic/Liver Function Pnl (80076-HEPATIC) TLB-TSH (Thyroid Stimulating Hormone) (84443-TSH) TLB-PSA (Prostate Specific Antigen) (84153-PSA) EKG w/ Interpretation (93000)  BP today: 120/78 Prior BP: 116/82 (08/17/2010)  Labs Reviewed: K+: 4.3 (04/05/2009) Creat: : 0.9 (04/05/2009)   Chol: 234 (04/05/2009)   HDL:  41.10 (04/05/2009)   LDL: 124 (09/24/2007)   TG: 105.0 (04/05/2009)  Complete Medication List: 1)  Lisinopril 20 Mg Tabs (Lisinopril) .... Take one tablet daily 2)  Meloxicam 15 Mg Tabs (Meloxicam) .... Take once daily 3)  Fluticasone Propionate 50 Mcg/act Susp (Fluticasone propionate) .... 2 sprays each nostril once daily 4)  Multivitamins Tabs (Multiple vitamin) .Marland Kitchen.. 1 tab by mouth once daily 5)  Acetaminophen 500 Mg Tabs (Acetaminophen) .... By mouth as needed  Other Orders: Tdap => 6yrs IM (82956) Admin 1st Vaccine (21308) Admin 1st Vaccine (65784) Flu Vaccine 44yrs + [phone])   Orders Added: 1)  Tdap => 58yrs IM [90715] 2)  Admin 1st Vaccine [90471] 3)  Venipuncture [36415] 4)  TLB-Lipid Panel [80061-LIPID] 5)  TLB-BMP (Basic Metabolic Panel-BMET) [80048-METABOL] 6)  TLB-CBC Platelet - w/Differential [85025-CBCD] 7)  TLB-Hepatic/Liver Function Pnl [80076-HEPATIC] 8)  TLB-TSH (Thyroid Stimulating Hormone) [84443-TSH] 9)  TLB-PSA (Prostate Specific Antigen) [52841-LKG] 10)  Orthopedic Surgeon Referral [Ortho Surgeon] 11)  Admin 1st Vaccine [90471] 12)  Flu Vaccine 5yrs + [90658] 13)  Est. Patient 40-64 years [99396] 14)  EKG w/ Interpretation [93000]   Immunizations Administered:  Tetanus Vaccine:    Vaccine Type: Tdap    Site: right deltoid    Mfr: Merck    Dose: 0.5 ml    Route: IM    Given by: Almeta Monas CMA (AAMA)    Exp. Date: 08/02/2012    Lot #: [id number]    VIS given: 08/31/08 version given October 12, 2010.   Immunizations Administered:  Tetanus Vaccine:    Vaccine Type: Tdap    Site: right deltoid    Mfr: Merck    Dose: 0.5 ml    Route: IM    Given by: Almeta Monas CMA (AAMA)    Exp. Date: 08/02/2012    Lot #: [id number]    VIS given: 08/31/08 version given October 12, 2010.    Flu Vaccine Result Date:  10/12/2010 Flu Vaccine Result:  given Flu Vaccine Next Due:  1 yr TD Result Date:  10/12/2010 TD Result:  given TD Next Due:  10  yr     Colonoscopy  Procedure date:  05/29/2004  Findings:      Results: Hemorrhoids.     Results: Diverticulosis.       Location:  [facility].    Comments:      Repeat colonoscopy in 10 years.   Flu Vaccine Consent Questions     Do you have a history of severe allergic reactions to this vaccine? no    Any prior history of allergic reactions to egg and/or gelatin? no    Do you have a sensitivity to the preservative Thimersol? no    Do you have a past history of Guillan-Barre Syndrome? no    Do you currently have an acute febrile illness? no    Have you ever had a severe reaction to latex? no    Vaccine information given and explained to patient? yes    Are you currently pregnant? no    Lot Number:[id number]   Exp Date:04/13/2011   Site Given  Right Deltoid IM      Results: Hemorrhoids.     Results: Diverticulosis.       Location:  [facility].    Comments:      Repeat colonoscopy in 10 years.   Marland Kitchenlbflu1  Laboratory Results   Urine Tests    Routine Urinalysis   Color: lt. yellow Appearance: Clear Glucose: negative   (Normal Range: Negative) Bilirubin: negative   (Normal Range: Negative) Ketone: negative   (Normal Range: Negative) Spec. Gravity: <1.005   (Normal Range: 1.003-1.035) Blood: negative   (Normal Range: Negative) pH: 7.5   (Normal Range: 5.0-8.0) Protein: negative   (Normal Range: Negative) Urobilinogen: 0.2   (Normal Range: 0-1) Nitrite: negative   (Normal Range: Negative) Leukocyte Esterace: negative   (Normal Range: Negative)

## 2010-11-15 NOTE — Letter (Signed)
Summary: [facility]: Immunoassay Fecal Occult Blood (iFOB) Order Form  [facility]  [address] [place], [place] [address]   Phone: [phone]  Fax: [phone]       Lab: Immunoassay Fecal Occult Blood (iFOB) Order Form   October 12, 2010 MRN: [id number]   Shane Haney 08-11-1961   Physicican Name: Dr.Lowne  Diagnosis Code: V56.71      Almeta Monas CMA (AAMA)

## 2010-11-15 NOTE — Letter (Signed)
Summary: Physical Exam of Drivers Form  Physical Exam of Drivers Form   Imported By: Lanelle Bal 10/22/2010 09:17:42  _____________________________________________________________________  External Attachment:    Type:   Image     Comment:   External Document

## 2011-01-14 ENCOUNTER — Telehealth: Payer: Self-pay | Admitting: Family Medicine

## 2011-01-14 MED ORDER — MELOXICAM 15 MG PO TABS: 15.0000 mg | ORAL_TABLET | Freq: Every day | ORAL | Status: DC

## 2011-01-14 NOTE — Telephone Encounter (Signed)
Refill x1  3 refills 

## 2011-01-14 NOTE — Telephone Encounter (Signed)
Patient called to request refill for Meloxicam---normally gets this prescription from doctor at [facility] but they cant give him an appointment for weeks and he said he doesn't want to pay the expensive copay just to get a refill--they suggested that he should call his PCP and see if she would refill his prescription---uses [facility], [address], [place]

## 2011-01-14 NOTE — Telephone Encounter (Signed)
Vm left advising Rx sent to pharmacy    KP

## 2011-01-14 NOTE — Telephone Encounter (Signed)
Last filled  10-02-09 #30 1 by Efraim Kaufmann, last ov 10-12-10 cpx Please advise

## 2011-02-26 NOTE — Assessment & Plan Note (Signed)
[facility]                            CARDIOLOGY OFFICE NOTE   NAME:Shane Haney, Shane Haney                     MRN:          [id number]  DATE:11/12/2007                            DOB:          [birth year]    Shane Haney is a 50 year old gentleman who I was recently asked to  evaluate for an abnormal electrocardiogram and hypertension.  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  His blood  pressure was elevated and we decreased his hydrochlorothiazide to 12.5  mg daily and add lisinopril 22 daily.  Since then, his blood pressure is  much improved by his report.  We did check followup laboratories and his  BUN,  creatinine and potassium were normal September 24, 2007.  His  cholesterol was 163 with an HDL 27.6 an LDL of 124.  Liver functions  were normal.  He has not had chest pain or shortness of breath.   MEDICATIONS:  1. Meloxicam 15 mg daily.  2. Multivitamin daily.  3. Hydrochlorothiazide 12.5 mg daily.  4. Lisinopril 20 mg daily.   PHYSICAL EXAM:  Today, shows a blood pressure 115/68.  His pulse 77.  HEENT:  Normal.  NECK:  Supple.  CHEST:  Clear.  CARDIOVASCULAR:  A regular rate and rhythm.  ABDOMEN: Benign.  EXTREMITIES:  Show no edema.   DIAGNOSES:  1. Hypertension - His blood pressure is much better on his present      regimen.  He will continue his lisinopril/hydrochlorothiazide and      he will follow up Dr. Blossom Hoops  for further management of this      issue.  2. Abnormal echocardiogram - He had an RV conduction delay, but was      otherwise unremarkable and had no cardiac symptoms.  He does not      require further cardiac evaluation at this time.  3. Hyperlipidemia - We discussed initiating a statin today.  However,      he would prefer to continue with diet, exercise and have it      rechecked. If it does not improve then he may benefit  from a statin      in the future, but I will leave this to Dr. Blossom Hoops.  4. History of  left upper extremity numbness - We did check vascular      studies and they were normal.   We discussed the importance of continued exercise and diet.  He will see  [place] back on an as-needed basis.     Madolyn Frieze Jens Som, MD, [facility]  Electronically Signed    BSC/MedQ  DD: 11/12/2007  DT: 11/12/2007  Job #: [id number]   cc:   Leanne Chang, M.D.

## 2011-02-26 NOTE — Assessment & Plan Note (Signed)
[facility]                            CARDIOLOGY OFFICE NOTE   NAME:Haney, Shane Foil                     MRN:          [id number]  DATE:09/17/2007                            DOB:          Aug 03, 1961    Shane Haney is a 50 year old male who I am asked to evaluate for an  abnormal electrocardiogram and hypertension.  He has no prior cardiac  history.  He typically does not have dyspnea on exertion, orthopnea,  PND, pedal edema, palpitations, presyncope, syncope, or exertional chest  pain.  He recently saw Dr. Blossom Hoops, and his electrocardiogram was felt  to be abnormal.  We were therefore asked to further evaluate.  He was  also noted to be hypertensive and started on hydrochlorothiazide 25 mg  p.o. daily.   PRESENT MEDICATIONS:  1. Multivitamin daily.  2. Hydrochlorothiazide 25 mg p.o. daily.  3. Meloxicam 15 mg p.o. daily.   He has no known drug allergies.   SOCIAL HISTORY:  He does not smoke.  He occasionally consumes alcohol.  There is no drug use.   FAMILY HISTORY:  Positive for coronary artery disease in both his mother  and father, although he does state they both smoked heavily.   PAST MEDICAL HISTORY:  Significant for hypertension and mild  hyperlipidemia.  There is no diabetes mellitus.  There is no other  medical history noted.  He has had previous knee surgeries.   REVIEW OF SYSTEMS:  He denies any headaches, fevers or chills.  There is  no productive cough or hemoptysis.  There is no dysphagia, odynophagia,  melena, or hematochezia.  He does state that he has had some  hematochezia in the past that has been attributed to a hemorrhoid, and  he has had a previous colonoscopy.  There is no dysuria or hematuria.  There is no rash or seizure activity.  There is no orthopnea, PND, or  pedal edema.  He does have some pain in his left knee with activities.  He also complains of occasional numbness in his left upper extremity.   PHYSICAL EXAMINATION:  Blood pressure in the left arm is 120/97.  In the  right arm, he is 140/97.  His pulse is 62.  He weighs 262 pounds.  He is well-developed and mildly obese.  He is in no acute distress.  His  skin is warm and dry.  He does not appear to be depressed.  There is no  peripheral clubbing.  His back is normal.  HEENT:  Unremarkable with normal eyelids.  NECK:  Supple with a normal upstrokes bilaterally, and there are no  bruits noted.  There is no jugular venous distention.  No thyromegaly is  noted.  CHEST:  Clear to auscultation with normal expansion.  CARDIAC:  Regular rate and rhythm with a normal S1 and S2.  There are no  murmurs, rubs or gallops noted.  ABDOMEN:  Nontender, nondistended.  Positive bowel sounds.  No  hepatosplenomegaly and no masses appreciated.  There is no abdominal  bruit.  He has 2+ femoral pulses bilaterally and no bruits.  EXTREMITIES:  No edema.  I can palpate no cords.  He has 2+ dorsalis  pedis pulses bilaterally.  He also has 2+ radial pulses.  NEUROLOGIC:  Grossly intact.   His electrocardiogram shows a sinus rhythm at a rate of 61.  He has an  incomplete right bundle branch block, but there are no ST changes noted.   DIAGNOSES:  1. Abnormal electrocardiogram:  Patient has no right ventricular      conduction delay but is otherwise unremarkable.  I do not think we      need to pursue further cardiac evaluation at this point.  2. Hypertension:  His blood pressure is elevated, despite a recent      addition of hydrochlorothiazide 25 mg p.o. daily.  I have asked him      to decrease this to 12.5 mg p.o. daily, and we will begin      lisinopril 20 mg p.o. daily.  This can be increased as needed.  I      will check a BMET in one week to follow this potassium and renal      function.  3. Risk factor modification:  We will check lipids and liver when he      returns for his BMET as well as his C-reactive protein.  We will      initiate  therapy if indicated.  4. Left upper extremity numbness with discrepancy in blood pressure:      We will schedule him to have Dopplers of his left upper extremity      to exclude subclavian stenosis.  5. We discussed the importance of diet and exercise as well today.  I      will see him back in 4-6 weeks to adjust his blood pressure      regimen.     Shane Haney Shane Som, MD, [facility]  Electronically Signed    BSC/MedQ  DD: 09/17/2007  DT: 09/17/2007  Job #: [id number]   cc:   Shane Haney, M.D.

## 2011-02-26 NOTE — Op Note (Signed)
NAME:  Shane Haney, Shane Haney              ACCOUNT NO.:  [id number]   MEDICAL RECORD NO.:  [id number]          PATIENT TYPE:  AMB   LOCATION:  [facility]                          FACILITY:  [facility]   PHYSICIAN:  Feliberto Gottron. Turner Daniels, M.D.   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   PROCEDURE:  Arthroscopic partial medial and lateral meniscectomies,  debridement of chondromalacia, removal of loose bodies including an  osteochondral fragment from the lateral tibial plateau.   SURGEON:  Feliberto Gottron. Turner Daniels, MD   FIRST ASSISTANT:  None.   ANESTHETIC:  General LMA with 20 mL of 0.5% Marcaine and epinephrine  solution as a local at the end of the case.   ESTIMATED BLOOD LOSS:  5 mL.   FLUID REPLACEMENT:  800 mL of crystalloid.   DRAINS PLACED:  None.   TOURNIQUET TIME:  None.   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  He had an ACL reconstruction in 1987, but  he does have significant catching, popping, and pain in his right knee  which by x-rays has lost about one half of the articular cartilage  medially, has a positive McMurray test, positive tenderness along the  medial joint line with crepitus.  He has failed conservative treatment,  anti-inflammatory medicines, activity modification, and has fairly  classic symptoms of the medial meniscal  tear and/or chondromalacia  based.  It is going on for many months.  He desires elective  arthroscopic evaluation and treatment.  Risks and benefits of surgery  were discussed, questions answered.   DESCRIPTION OF PROCEDURE:  The patient was identified by armband, taken  to the operating room at [facility].  Appropriate  anesthetic monitors were attached and general LMA anesthesia induced  with the patient in supine position.  He then received 2 g of Ancef IV,  lateral post applied to the table and the right lower extremity was  prepped and draped in sterile fashion from the ankle to the midthigh.  A  time-out procedure was then performed and using a #11 blade, a standard  portals, inferomedial and inferolateral peripatellar were made allowing  introduction of the arthroscope through the inferolateral portal and the  outflow through the inferomedial portal.  Pump pressure was kept between  the 60 and 110 mmHg for the 35-40 minutes of the duration of the case.  Diagnostic arthroscopy revealed a relatively pristine patellofemoral  joint, moving on medial compartment complex,  degenerative tearing with  parrot-beak tears multiple to the mid and posterior horns of the medial  meniscus were debrided back to stable margin with a straight biter and a  3.5 Gator sucker shaver.  Multiple cartilaginous loose bodies were  removed, the donor sites were being grade 3 to grade 4 chondromalacia  from the distal and posterior aspects of the medial femoral condyle,  most of these were focal divots about 3-4 mm in size, but there was some  generalized grade 3 chondromalacia as well.  The ACL and the PCL were  intact.  There was small crack in lateral tibial spine that appeared to  be a few weeks old and this was easily debrided with a 3.5 Gator sucker  shaver removing the 2-3 mm piece of bone compromise the tip of the  lateral tibial spine.  The anterior horn lateral meniscus was also noted   be torn.  This was debrided back to stable margin with a 3.5 Gator  sucker shaver.  The articular cartilage laterally was in excellent  condition.  The gutters were cleared medially and laterally.  The  arthroscope taken through the notch clearing the posterior compartments  as well.  At this point, the knee was irrigated out normal saline  solution.  The arthroscopic instruments removed and a dressing of  Xeroform, 4 x 4 dressing sponges, Webril, and an Ace wrap applied.      Feliberto Gottron. Turner Daniels, M.D.  Electronically Signed     FJR/MEDQ  D:  10/10/2008  T:  10/10/2008  Job:  [id number]

## 2011-04-14 ENCOUNTER — Other Ambulatory Visit: Payer: Self-pay | Admitting: Cardiology

## 2011-04-15 ENCOUNTER — Other Ambulatory Visit: Payer: Self-pay | Admitting: Family Medicine

## 2011-04-15 NOTE — Telephone Encounter (Signed)
CPX done 10/12/10 and Rx filled 01/14/11 please advise    KP

## 2011-04-16 ENCOUNTER — Other Ambulatory Visit: Payer: Self-pay | Admitting: Family Medicine

## 2011-04-16 MED ORDER — MELOXICAM 15 MG PO TABS: 15.0000 mg | ORAL_TABLET | Freq: Every day | ORAL | Status: DC

## 2011-04-16 NOTE — Telephone Encounter (Signed)
Ok to refill for 90?

## 2011-04-16 NOTE — Telephone Encounter (Signed)
Ok to refill 

## 2011-06-14 ENCOUNTER — Other Ambulatory Visit: Payer: Self-pay | Admitting: *Deleted

## 2011-06-14 MED ORDER — LISINOPRIL 20 MG PO TABS: 20.0000 mg | ORAL_TABLET | Freq: Every day | ORAL | Status: DC

## 2011-06-19 ENCOUNTER — Telehealth: Payer: Self-pay | Admitting: *Deleted

## 2011-06-20 ENCOUNTER — Other Ambulatory Visit: Payer: Self-pay | Admitting: Family Medicine

## 2011-06-21 NOTE — Telephone Encounter (Signed)
Close./cy

## 2011-07-15 ENCOUNTER — Other Ambulatory Visit: Payer: Self-pay | Admitting: Family Medicine

## 2011-07-16 MED ORDER — MELOXICAM 15 MG PO TABS: 15.0000 mg | ORAL_TABLET | Freq: Every day | ORAL | Status: DC

## 2011-07-16 NOTE — Telephone Encounter (Signed)
Last seen 10/12/10 and filled 04/16/11 please advise    KP

## 2011-07-16 NOTE — Telephone Encounter (Signed)
Faxed.   KP 

## 2011-07-19 LAB — POCT I-STAT, CHEM 8
BUN: 14 mg/dL (ref 6–23)
Calcium, Ion: 1.17 mmol/L (ref 1.12–1.32)
Creatinine, Ser: 1 mg/dL (ref 0.4–1.5)
Glucose, Bld: 101 mg/dL — ABNORMAL HIGH (ref 70–99)
Hemoglobin: 14.6 g/dL (ref 13.0–17.0)
TCO2: 23 mmol/L (ref 0–100)

## 2011-08-14 ENCOUNTER — Other Ambulatory Visit: Payer: Self-pay | Admitting: Family Medicine

## 2011-08-15 NOTE — Telephone Encounter (Signed)
Last seen 10/12/10 and filled 07/16/11 # 30 please advise    KP

## 2011-08-17 ENCOUNTER — Other Ambulatory Visit: Payer: Self-pay | Admitting: Family Medicine

## 2011-09-15 ENCOUNTER — Other Ambulatory Visit: Payer: Self-pay | Admitting: Family Medicine

## 2011-09-16 NOTE — Telephone Encounter (Signed)
Last seen 10/12/10 and filled 08/14/11. Please advise    KP

## 2011-10-16 ENCOUNTER — Other Ambulatory Visit: Payer: Self-pay | Admitting: Family Medicine

## 2011-11-19 ENCOUNTER — Other Ambulatory Visit: Payer: Self-pay | Admitting: Family Medicine

## 2011-11-19 NOTE — Telephone Encounter (Signed)
Last seen 10/12/10 and filled 10/16/11. Please advise    KP

## 2011-12-02 ENCOUNTER — Encounter: Payer: Self-pay | Admitting: Family Medicine

## 2011-12-02 ENCOUNTER — Ambulatory Visit (INDEPENDENT_AMBULATORY_CARE_PROVIDER_SITE_OTHER): Payer: BC Managed Care – PPO | Admitting: Family Medicine

## 2011-12-02 VITALS — BP 118/84 | HR 72 | Temp 98.8°F | Wt 274.6 lb

## 2011-12-02 DIAGNOSIS — J069 Acute upper respiratory infection, unspecified: Secondary | ICD-10-CM

## 2011-12-02 MED ORDER — GUAIFENESIN-CODEINE 100-10 MG/5ML PO SYRP: ORAL_SOLUTION | ORAL | Status: DC

## 2011-12-02 NOTE — Patient Instructions (Signed)

## 2011-12-02 NOTE — Progress Notes (Signed)
  Subjective:     Shane Haney is a 51 y.o. male who presents for evaluation of sinus pain. Symptoms include: congestion, cough, facial pain, nasal congestion, sinus pressure and sore throat. Onset of symptoms was 4 days ago. Symptoms have been gradually worsening since that time. Past history is significant for no history of pneumonia or bronchitis. Patient is a non-smoker. Pt tried otc meds with no relief. The following portions of the patient's history were reviewed and updated as appropriate: allergies, current medications, past family history, past medical history, past social history, past surgical history and problem list.  Review of Systems Pertinent items are noted in HPI.   Objective:    BP 118/84  Pulse 72  Temp(Src) 98.8 F (37.1 C) (Oral)  Wt 274 lb 9.6 oz (124.558 kg)  SpO2 97% General appearance: alert, cooperative, appears stated age and no distress Ears: normal TM's and external ear canals both ears Nose: clear and mucoid discharge, mild congestion, turbinates red, swollen, no sinus tenderness Throat: lips, mucosa, and tongue normal; teeth and gums normal Neck: no adenopathy, supple, symmetrical, trachea midline and thyroid not enlarged, symmetric, no tenderness/mass/nodules Lungs: clear to auscultation bilaterally Heart: S1, S2 normal Lymph nodes: Cervical, supraclavicular, and axillary nodes normal.    Assessment:    Acute non-bacterial sinusitis.    Plan:    Nasal steroids per medication orders. Antihistamines per medication orders. rto or call if symptoms do not resolve or if they worsen

## 2011-12-05 ENCOUNTER — Telehealth: Payer: Self-pay | Admitting: *Deleted

## 2011-12-05 MED ORDER — CEFUROXIME AXETIL 500 MG PO TABS: 500.0000 mg | ORAL_TABLET | Freq: Two times a day (BID) | ORAL | Status: AC

## 2011-12-05 NOTE — Telephone Encounter (Signed)
ceftin 500 mg 1 po bid x 10 days 

## 2011-12-05 NOTE — Telephone Encounter (Signed)
[facility] Triage Call Report Triage Record Num: [id number] Operator: Meribeth Mattes Patient Name: Shane Haney Call Date & Time: 12/05/2011 8:16:03AM Patient Phone: [phone] PCP: Lelon Perla Patient Gender: Male PCP Fax : [phone] Patient DOB: 03-23-1961 Practice Name: [facility] Reason for Call: OFFICE TO FOLLOW UP PLEASE. Caller: Curt/Patient; PCP: Lelon Perla.; CB#: [phone]; Call regarding Cough/Congestion; onset 11/28/11, was seen in office 12/02/11, was told to call back for antibiotic if sx worsens, now having green drainage, afebrile, taking fluids fine, no diff breathing Uses pharmacy [facility] [phone] May leave message on cell phone if does not answer. ([employer]ach [employer]). Protocol(s) Used: Office Note Recommended Outcome per Protocol: Information Noted and Sent to Office Reason for Outcome: Caller information to office Care Advice: ~ 02/

## 2011-12-05 NOTE — Telephone Encounter (Signed)
Msg left making patient aware Rx sent to the pharmacy      KP

## 2011-12-05 NOTE — Telephone Encounter (Signed)
Sent to MD for review-- Please advise    KP

## 2011-12-06 ENCOUNTER — Telehealth: Payer: Self-pay | Admitting: Family Medicine

## 2011-12-06 NOTE — Telephone Encounter (Signed)
Discussed with patient and he is aware Rx will not effect his labs.   KP

## 2011-12-06 NOTE — Telephone Encounter (Signed)
Im not exactly sure what he is asking but if he means will it affect his labs----no.

## 2011-12-06 NOTE — Telephone Encounter (Signed)
Please advise      KP 

## 2011-12-09 ENCOUNTER — Encounter: Payer: Self-pay | Admitting: Family Medicine

## 2011-12-09 ENCOUNTER — Ambulatory Visit (INDEPENDENT_AMBULATORY_CARE_PROVIDER_SITE_OTHER): Payer: BC Managed Care – PPO | Admitting: Family Medicine

## 2011-12-09 VITALS — BP 116/84 | HR 71 | Temp 97.9°F | Ht 70.0 in | Wt 277.8 lb

## 2011-12-09 DIAGNOSIS — E785 Hyperlipidemia, unspecified: Secondary | ICD-10-CM

## 2011-12-09 DIAGNOSIS — Z0000 Encounter for general adult medical examination without abnormal findings: Secondary | ICD-10-CM

## 2011-12-09 DIAGNOSIS — I10 Essential (primary) hypertension: Secondary | ICD-10-CM

## 2011-12-09 DIAGNOSIS — M1710 Unilateral primary osteoarthritis, unspecified knee: Secondary | ICD-10-CM

## 2011-12-09 LAB — POCT URINALYSIS DIPSTICK
Blood, UA: NEGATIVE
Nitrite, UA: NEGATIVE
Protein, UA: NEGATIVE
Spec Grav, UA: <=1.005
Urobilinogen, UA: 0.2
pH, UA: 5

## 2011-12-09 MED ORDER — LISINOPRIL 20 MG PO TABS: 20.0000 mg | ORAL_TABLET | Freq: Every day | ORAL | Status: DC

## 2011-12-09 MED ORDER — MELOXICAM 15 MG PO TABS: ORAL_TABLET | ORAL | Status: DC

## 2011-12-09 NOTE — Patient Instructions (Signed)
Preventative Care for Adults, Male A healthy lifestyle and preventative care can promote health and wellness. Preventative health guidelines for men include the following key practices:  A routine yearly physical is a good way to check with your caregiver about your health and preventative screening. It is a chance to share any concerns and updates on your health, and to receive a thorough exam.   Visit your dentist for a routine exam and preventative care every 6 months. Brush your teeth twice a day and floss once a day. Good oral hygiene prevents tooth decay and gum disease.   The frequency of eye exams is based on your age, health, family medical history, use of contact lenses, and other factors. Follow your caregiver's recommendations for frequency of eye exams.   Eat a healthy diet. Foods like vegetables, fruits, whole grains, low-fat dairy products, and lean protein foods contain the nutrients you need without too many calories. Decrease your intake of foods high in solid fats, added sugars, and salt. Eat the right amount of calories for you.Get information about a proper diet from your caregiver, if necessary.   Regular physical exercise is one of the most important things you can do for your health. Most adults should get at least 150 minutes of moderate-intensity exercise (any activity that increases your heart rate and causes you to sweat) each week. In addition, most adults need muscle-strengthening exercises on 2 or more days a week.   Maintain a healthy weight. The body mass index (BMI) is a screening tool to identify possible weight problems. It provides an estimate of body fat based on height and weight. Your caregiver can help determine your BMI, and can help you achieve or maintain a healthy weight.For adults 20 years and older:   A BMI below 18.5 is considered underweight.   A BMI of 18.5 to 24.9 is normal.   A BMI of 25 to 29.9 is considered overweight.   A BMI of 30 and  above is considered obese.   Maintain normal blood lipids and cholesterol levels by exercising and minimizing your intake of saturated fat. Eat a balanced diet with plenty of fruit and vegetables. Blood tests for lipids and cholesterol should begin at age 20 and be repeated every 5 years. If your lipid or cholesterol levels are high, you are over 50, or you are a high risk for heart disease, you may need your cholesterol levels checked more frequently.Ongoing high lipid and cholesterol levels should be treated with medicines if diet and exercise are not effective.   If you smoke, find out from your caregiver how to quit. If you do not use tobacco, do not start.   If you choose to drink alcohol, do not exceed 2 drinks per day. One drink is considered to be 12 ounces (355 mL) of beer, 5 ounces (148 mL) of wine, or 1.5 ounces (44 mL) of liquor.   Avoid use of street drugs. Do not share needles with anyone. Ask for help if you need support or instructions about stopping the use of drugs.   High blood pressure causes heart disease and increases the risk of stroke. Your blood pressure should be checked at least every 1 to 2 years. Ongoing high blood pressure should be treated with medicines, if weight loss and exercise are not effective.   If you are 45 to 49 years old, ask your caregiver if you should take aspirin to prevent heart disease.   Diabetes screening involves taking a blood   sample to check your fasting blood sugar level. This should be done once every 3 years, after age 45, if you are within normal weight and without risk factors for diabetes. Testing should be considered at a younger age or be carried out more frequently if you are overweight and have at least 1 risk factor for diabetes.   Colorectal cancer can be detected and often prevented. Most routine colorectal cancer screening begins at the age of 50 and continues through age 75. However, your caregiver may recommend screening at an  earlier age if you have risk factors for colon cancer. On a yearly basis, your caregiver may provide home test kits to check for hidden blood in the stool. Use of a small camera at the end of a tube, to directly examine the colon (sigmoidoscopy or colonoscopy), can detect the earliest forms of colorectal cancer. Talk to your caregiver about this at age 50, when routine screening begins. Direct examination of the colon should be repeated every 5 to 10 years through age 75, unless early forms of pre-cancerous polyps or small growths are found.   Practice safe sex. Use condoms and avoid high-risk sexual practices to reduce the spread of sexually transmitted infections (STIs). STIs include gonorrhea, chlamydia, syphilis, trichomonas, herpes, HPV, and human immunodeficiency virus (HIV). Herpes, HIV, and HPV are viral illnesses that have no cure. They can result in disability, cancer, and death.   A one-time screening for abdominal aortic aneurysm (AAA) and surgical repair of large AAAs by sound wave imaging (ultrasonography) is recommended for ages 65 to 75 years who are current or former smokers.   Healthy men should no longer receive prostate-specific antigen (PSA) blood tests as part of routine cancer screening. Consult with your caregiver about prostate cancer screening.   Use sunscreen with skin protection factor (SPF) of 30 or more. Apply sunscreen liberally and repeatedly throughout the day. You should seek shade when your shadow is shorter than you. Protect yourself by wearing long sleeves, pants, a wide-brimmed hat, and sunglasses year round, whenever you are outdoors.   Once a month, do a whole body skin exam, using a mirror to look at the skin on your back. Notify your caregiver of new moles, moles that have irregular borders, moles that are larger than a pencil eraser, or moles that have changed in shape or color.   Stay current with required immunizations.   Influenza. You need a dose every  fall (or winter). The composition of the flu vaccine changes each year, so being vaccinated once is not enough.   Pneumococcal polysaccharide. You need 1 to 2 doses if you smoke cigarettes or if you have certain chronic medical conditions. You need 1 dose at age 65 (or older) if you have never been vaccinated.   Tetanus, diphtheria, pertussis (Tdap, Td). Get 1 dose of Tdap vaccine if you are younger than age 65 years, are over 65 and have contact with an infant, are a healthcare worker, or simply want to be protected from whooping cough. After that, you need a Td booster dose every 10 years. Consult your caregiver if you have not had at least 3 tetanus and diphtheria-containing shots sometime in your life or have a deep or dirty wound.   HPV. This vaccine is recommended for males 13 through 49 years of age. This vaccine may be given to men 22 through 49 years of age who have not completed the 3 dose series. It is recommended for men through age 26   who have sex with men or whose immune system is weakened because of HIV infection, other illness, or medications. The vaccine is given in 3 doses over 6 months.   Measles, mumps, rubella (MMR). You need at least 1 dose of MMR if you were born in 1957 or later. You may also need a 2nd dose.   Meningococcal. If you are age 19 to 21 years and a first-year college student living in a residence hall, or have one of several medical conditions, you need to get vaccinated against meningococcal disease. You may also need additional booster doses.   Zoster (shingles). If you are age 60 years or older, you should get this vaccine.   Varicella (chickenpox). If you have never had chickenpox or you were vaccinated but received only 1 dose, talk to your caregiver to find out if you need this vaccine.   Hepatitis A. You need this vaccine if you have a specific risk factor for hepatitis A virus infection, or you simply wish to be protected from this disease. The vaccine is  usually given as 2 doses, 6 to 18 months apart.   Hepatitis B. You need this vaccine if you have a specific risk factor for hepatitis B virus infection or you simply wish to be protected from this disease. The vaccine is given in 3 doses, usually over 6 months.  Preventative Service / Frequency Ages 19 to 39  Blood pressure check.** / Every 1 to 2 years.   Lipid and cholesterol check.**/ Every 5 years beginning at age 20.   Skin self-exam. / Monthly.   Influenza immunization.** / Every year.   Pneumococcal polysaccharide immunization.** / 1 to 2 doses if you smoke cigarettes or if you have certain chronic medical conditions.   Tetanus, diphtheria, pertussis (Tdap,Td) immunization. / A one-time dose of Tdap vaccine. After that, you need a Td booster dose every 10 years.   HPV immunization. / 3 doses over 6 months, if 26 and younger.   Measles, mumps, rubella (MMR) immunization. / You need at least 1 dose of MMR if you were born in 1957 or later. You may also need a 2nd dose.   Meningococcal immunization. / 1 dose if you are age 19 to 21 years and a first-year college student living in a residence hall, or have one of several medical conditions, you need to get vaccinated against meningococcal disease. You may also need additional booster doses.   Varicella immunization. **/ Consult your caregiver.   Hepatitis A immunization. ** / Consult your caregiver. 2 doses, 6 to 18 months apart.   Hepatitis B immunization.** / Consult your caregiver. 3 doses usually over 6 months.  Ages 40 to 64  Blood pressure check.** / Every 1 to 2 years.   Lipid and cholesterol check.**/ Every 5 years beginning at age 20.   Fecal occult blood test (FOBT) of stool. / Every year beginning at age 50 and continuing until age 75. You may not have to do this test if you get colonoscopy every 10 years.   Flexible sigmoidoscopy** or colonoscopy.** / Every 5 years for a flexible sigmoidoscopy or every 10 years for  a colonoscopy beginning at age 50 and continuing until age 75.   Skin self-exam. / Monthly.   Influenza immunization.** / Every year.   Pneumococcal polysaccharide immunization.** / 1 to 2 doses if you smoke cigarettes or if you have certain chronic medical conditions.   Tetanus, diphtheria, pertussis (Tdap/Td) immunization.** / A one-time dose of   Tdap vaccine. After that, you need a Td booster dose every 10 years.   Measles, mumps, rubella (MMR) immunization. / You need at least 1 dose of MMR if you were born in 1957 or later. You may also need a 2nd dose.   Varicella immunization. **/ Consult your caregiver.   Meningococcal immunization.** / Consult your caregiver.   Hepatitis A immunization. ** / Consult your caregiver. 2 doses, 6 to 18 months apart.   Hepatitis B immunization.** / Consult your caregiver. 3 doses, usually over 6 months.  Ages 65 and over  Blood pressure check.** / Every 1 to 2 years.   Lipid and cholesterol check.**/ Every 5 years beginning at age 20.   Fecal occult blood test (FOBT) of stool. / Every year beginning at age 50 and continuing until age 75. You may not have to do this test if you get colonoscopy every 10 years.   Flexible sigmoidoscopy** or colonoscopy.** / Every 5 years for a flexible sigmoidoscopy or every 10 years for a colonoscopy beginning at age 50 and continuing until age 75.   Abdominal aortic aneurysm (AAA) screening.** / A one-time screening for ages 65 to 75 years who are current or former smokers.   Skin self-exam. / Monthly.   Influenza immunization.** / Every year.   Pneumococcal polysaccharide immunization.** / 1 dose at age 65 (or older) if you have never been vaccinated.   Tetanus, diphtheria, pertussis (Tdap, Td) immunization. / A one-time dose of Tdap vaccine if you are over 65 and have contact with an infant, are a healthcare worker, or simply want to be protected from whooping cough. After that, you need a Td booster dose  every 10 years.   Varicella immunization. **/ Consult your caregiver.   Meningococcal immunization.** / Consult your caregiver.   Hepatitis A immunization. ** / Consult your caregiver. 2 doses, 6 to 18 months apart.   Hepatitis B immunization.** / Check with your caregiver. 3 doses, usually over 6 months.  **Family history and personal history of risk and conditions may change your caregiver's recommendations. Document Released: 11/26/2001 Document Revised: 06/12/2011 Document Reviewed: 02/25/2011 [employer] Patient Information 2012 [employer]. 

## 2011-12-09 NOTE — Assessment & Plan Note (Signed)
Check labs con't meds 

## 2011-12-09 NOTE — Progress Notes (Signed)
Subjective:    Patient ID: Shane Haney, male    DOB: 1961/09/12, 51 y.o.   MRN: [id number]  HPI Pt here for cpe and labs.  No complaints.    Review of Systems Review of Systems  Constitutional: Negative for activity change, appetite change and fatigue.  HENT: Negative for hearing loss, congestion, tinnitus and ear discharge.  dentist q26m Eyes: Negative for visual disturbance (see optho q1y -- vision corrected to 20/20 with glasses).  Respiratory: Negative for cough, chest tightness and shortness of breath.   Cardiovascular: Negative for chest pain, palpitations and leg swelling.  Gastrointestinal: Negative for abdominal pain, diarrhea, constipation and abdominal distention.  Genitourinary: Negative for urgency, frequency, decreased urine volume and difficulty urinating.  Musculoskeletal: Negative for back pain, arthralgias and gait problem.  Skin: Negative for color change, pallor and rash.  Neurological: Negative for dizziness, light-headedness, numbness and headaches.  Hematological: Negative for adenopathy. Does not bruise/bleed easily.  Psychiatric/Behavioral: Negative for suicidal ideas, confusion, sleep disturbance, self-injury, dysphoric mood, decreased concentration and agitation.   Past Medical History  Diagnosis Date  . Hyperlipidemia   . Hypertension    History   Social History  . Marital Status: Married    Spouse Name: N/A    Number of Children: N/A  . Years of Education: N/A   Occupational History  . teacher--- [employer]   Social History Main Topics  . Smoking status: Never Smoker   . Smokeless tobacco: Never Used  . Alcohol Use: 3.6 oz/week    6 Cans of beer per week  . Drug Use: No  . Sexually Active: Yes -- Male partner(s)   Other Topics Concern  . Not on file   Social History Narrative   Exercise-- 5-6 days a week   Family History  Problem Relation Age of Onset  . Stroke Father   . Hypertension Father   . Alcohol abuse  Father   . Hypertension Mother   . Aneurysm Mother     Brain  . HIV Brother     passed away from AIDS         Objective:   Physical Exam BP 116/84  Pulse 71  Temp(Src) 97.9 F (36.6 C) (Oral)  Ht 5\' 10"  (1.778 m)  Wt 277 lb 12.8 oz (126.009 kg)  BMI 39.86 kg/m2  SpO2 95%  General Appearance:    Alert, cooperative, no distress, appears stated age  Head:    Normocephalic, without obvious abnormality, atraumatic  Eyes:    PERRL, conjunctiva/corneas clear, EOM's intact, fundi    benign, both eyes       Ears:    Normal TM's and external ear canals, both ears  Nose:   Nares normal, septum midline, mucosa normal, no drainage   or sinus tenderness  Throat:   Lips, mucosa, and tongue normal; teeth and gums normal  Neck:   Supple, symmetrical, trachea midline, no adenopathy;       thyroid:  No enlargement/tenderness/nodules; no carotid   bruit or JVD  Back:     Symmetric, no curvature, ROM normal, no CVA tenderness  Lungs:     Clear to auscultation bilaterally, respirations unlabored  Chest wall:    No tenderness or deformity  Heart:    Regular rate and rhythm, S1 and S2 normal, no murmur, rub   or gallop  Abdomen:     Soft, non-tender, bowel sounds active all four quadrants,    no masses, no organomegaly  Genitalia:  Normal male without lesion, discharge or tenderness  Rectal:    Normal tone, normal prostate, no masses or tenderness;   guaiac negative stool  Extremities:   Extremities normal, atraumatic, no cyanosis or edema  Pulses:   2+ and symmetric all extremities  Skin:   Skin color, texture, turgor normal, no rashes or lesions  Lymph nodes:   Cervical, supraclavicular, and axillary nodes normal  Neurologic:   CNII-XII intact. Normal strength, sensation and reflexes      throughout          Assessment & Plan:  cPE--- check labs,  ghm utd

## 2011-12-09 NOTE — Assessment & Plan Note (Signed)
con't meds stable 

## 2011-12-10 ENCOUNTER — Other Ambulatory Visit: Payer: Self-pay | Admitting: Family Medicine

## 2011-12-10 DIAGNOSIS — E785 Hyperlipidemia, unspecified: Secondary | ICD-10-CM

## 2011-12-10 LAB — LIPID PANEL
Cholesterol: 211 mg/dL — ABNORMAL HIGH (ref 0–200)
HDL: 40.5 mg/dL (ref >39.00)
Total CHOL/HDL Ratio: 5
VLDL: 19.8 mg/dL (ref 0.0–40.0)

## 2011-12-10 LAB — CBC WITH DIFFERENTIAL/PLATELET
Basophils Absolute: 0 10*3/uL (ref 0.0–0.1)
Eosinophils Absolute: 0.2 10*3/uL (ref 0.0–0.7)
Hemoglobin: 15.8 g/dL (ref 13.0–17.0)
Lymphocytes Relative: 37.5 % (ref 12.0–46.0)
Lymphs Abs: 3.4 10*3/uL (ref 0.7–4.0)
MCHC: 34 g/dL (ref 30.0–36.0)
Monocytes Relative: 7.2 % (ref 3.0–12.0)
Neutro Abs: 4.8 10*3/uL (ref 1.4–7.7)
Platelets: 267 10*3/uL (ref 150.0–400.0)
RDW: 12.5 % (ref 11.5–14.6)

## 2011-12-10 LAB — BASIC METABOLIC PANEL
BUN: 13 mg/dL (ref 6–23)
CO2: 26 mEq/L (ref 19–32)
Calcium: 9.7 mg/dL (ref 8.4–10.5)
GFR: 90.76 mL/min (ref >60.00)
Glucose, Bld: 81 mg/dL (ref 70–99)
Sodium: 137 mEq/L (ref 135–145)

## 2011-12-10 LAB — HEPATIC FUNCTION PANEL
Albumin: 4.5 g/dL (ref 3.5–5.2)
Total Bilirubin: 0.8 mg/dL (ref 0.3–1.2)

## 2011-12-16 ENCOUNTER — Other Ambulatory Visit: Payer: Self-pay | Admitting: Family Medicine

## 2012-01-03 ENCOUNTER — Encounter: Payer: Self-pay | Admitting: Family Medicine

## 2012-01-03 ENCOUNTER — Ambulatory Visit (INDEPENDENT_AMBULATORY_CARE_PROVIDER_SITE_OTHER): Payer: BC Managed Care – PPO | Admitting: Family Medicine

## 2012-01-03 VITALS — BP 116/82 | HR 65 | Temp 98.4°F | Wt 275.4 lb

## 2012-01-03 DIAGNOSIS — J329 Chronic sinusitis, unspecified: Secondary | ICD-10-CM

## 2012-01-03 MED ORDER — AMOXICILLIN-POT CLAVULANATE 875-125 MG PO TABS: 1.0000 | ORAL_TABLET | Freq: Two times a day (BID) | ORAL | Status: AC

## 2012-01-03 NOTE — Progress Notes (Signed)
  Subjective:     Shane Haney is a 51 y.o. male who presents for evaluation of sinus pain. Symptoms include: congestion, facial pain, headaches, nasal congestion, post nasal drip, sinus pressure and sore throat. Onset of symptoms was 6 days ago. Symptoms have been gradually worsening since that time. Past history is significant for no history of pneumonia or bronchitis. Patient is a non-smoker.  The following portions of the patient's history were reviewed and updated as appropriate: allergies, current medications, past family history, past medical history, past social history, past surgical history and problem list.  Review of Systems Pertinent items are noted in HPI.   Objective:    BP 116/82  Pulse 65  Temp(Src) 98.4 F (36.9 C) (Oral)  Wt 275 lb 6.4 oz (124.921 kg)  SpO2 97% General appearance: alert, cooperative, appears stated age and no distress Head: Normocephalic, without obvious abnormality, atraumatic Ears: normal TM's and external ear canals both ears Nose: green discharge, moderate congestion, sinus tenderness bilateral Throat: abnormal findings: pnd Neck: mild anterior cervical adenopathy, supple, symmetrical, trachea midline and thyroid not enlarged, symmetric, no tenderness/mass/nodules Lungs: clear to auscultation bilaterally Heart: S1, S2 normal Lymph nodes: Cervical adenopathy: mild,  b/l    Assessment:    Acute bacterial sinusitis.    Plan:    Nasal steroids per medication orders. Antihistamines per medication orders. Augmentin per medication orders.

## 2012-01-03 NOTE — Patient Instructions (Signed)

## 2012-01-17 ENCOUNTER — Other Ambulatory Visit: Payer: Self-pay

## 2012-01-17 MED ORDER — MOXIFLOXACIN HCL 400 MG PO TABS: 400.0000 mg | ORAL_TABLET | Freq: Every day | ORAL | Status: AC

## 2012-01-17 NOTE — Telephone Encounter (Signed)
avelox 400 mg 1 po qd x 10 days #10

## 2012-01-17 NOTE — Telephone Encounter (Signed)
Patient made aware Rx sent to the pharmacy     KP 

## 2012-01-17 NOTE — Telephone Encounter (Signed)
Pt states he finished his antibiotics for a sinusitis and started to feel better? Pt stated that he is getting worse again now that the antibiotics are done and would like something else called in? Please advise? Patient callback [phone]

## 2012-08-03 ENCOUNTER — Ambulatory Visit (INDEPENDENT_AMBULATORY_CARE_PROVIDER_SITE_OTHER): Payer: BC Managed Care – PPO | Admitting: Internal Medicine

## 2012-08-03 ENCOUNTER — Encounter: Payer: Self-pay | Admitting: Internal Medicine

## 2012-08-03 VITALS — BP 126/84 | HR 72 | Temp 98.2°F | Wt 275.4 lb

## 2012-08-03 DIAGNOSIS — J019 Acute sinusitis, unspecified: Secondary | ICD-10-CM

## 2012-08-03 MED ORDER — FLUTICASONE PROPIONATE 50 MCG/ACT NA SUSP: 1.0000 | Freq: Two times a day (BID) | NASAL | Status: DC | PRN

## 2012-08-03 MED ORDER — AMOXICILLIN 500 MG PO CAPS: 500.0000 mg | ORAL_CAPSULE | Freq: Three times a day (TID) | ORAL | Status: DC

## 2012-08-03 NOTE — Patient Instructions (Addendum)
Plain Mucinex for thick secretions ;force NON dairy fluids . Use a Neti pot daily as needed for sinus congestion; going from open side to congested side . Nasal cleansing in the shower as discussed. Make sure that all residual soap is removed to prevent irritation. Fluticasone 1 spray in each nostril twice a day as needed. Use the "crossover" technique as discussed. Plain Allegra 160 daily as needed for itchy eyes & sneezing.    

## 2012-08-03 NOTE — Progress Notes (Signed)
  Subjective:    Patient ID: Shane Haney, male    DOB: Oct 29, 1960, 51 y.o.   MRN: [id number]  HPI   Symptoms began at least 7-10 days ago as head congestion and cough intermittently productive of thick sputum. Over the last 4 days he has noted green sputum.  He has had some nasal obstruction; nasal purulence; facial pain; frontal headache; halitosis; and fatigue.  Symptoms have not resolved with over-the-counter medications and nasal lavage with a Neti pot.    Review of Systems  He denies extensive symptoms of itchy, watery eyes or sneezing. He has not had loss of smell; fever; significant ear pain; or dental pain.     Objective:   Physical Exam General appearance:good health ;well nourished; no acute distress or increased work of breathing is present.  No  lymphadenopathy about the head, neck, or axilla noted.   Eyes: No conjunctival inflammation or lid edema is present.   Ears:  External ear exam shows no significant lesions or deformities.  Otoscopic examination reveals clear canals, tympanic membranes are intact bilaterally without bulging, retraction, inflammation or discharge.  Nose:  External nasal examination shows no deformity or inflammation. Nasal mucosa are erythematous  With polyp on L. No septal dislocation or deviation.Some  L nare obstruction to airflow.   Oral exam: Dental hygiene is good; lips and gums are healthy appearing.There is no oropharyngeal erythema or exudate noted.   Neck:  No deformities,  masses, or tenderness noted.      Heart:  Normal rate and regular rhythm. S1 and S2 normal without gallop, murmur, click, rub or other extra sounds.   Lungs:Chest clear to auscultation; no wheezes, rhonchi,rales ,or rubs present.No increased work of breathing.    Extremities:  No cyanosis, edema, or clubbing  noted    Skin: Warm & dry           Assessment & Plan:  #1 rhinosinusitis without significant bronchitis  Plan: Nasal hygiene interventions  discussed. See prescription medications

## 2012-11-28 ENCOUNTER — Other Ambulatory Visit: Payer: Self-pay

## 2012-12-02 ENCOUNTER — Other Ambulatory Visit: Payer: Self-pay | Admitting: Family Medicine

## 2012-12-03 NOTE — Telephone Encounter (Signed)
Last seen 01/03/12 and filled 12/09/11 #90 with 3 refills. Pending apt 4/14. Please advise      KP

## 2013-01-26 ENCOUNTER — Ambulatory Visit (INDEPENDENT_AMBULATORY_CARE_PROVIDER_SITE_OTHER): Payer: BC Managed Care – PPO | Admitting: Family Medicine

## 2013-01-26 ENCOUNTER — Encounter: Payer: Self-pay | Admitting: Family Medicine

## 2013-01-26 VITALS — BP 114/72 | HR 58 | Temp 98.7°F | Ht 70.0 in | Wt 276.8 lb

## 2013-01-26 DIAGNOSIS — M1710 Unilateral primary osteoarthritis, unspecified knee: Secondary | ICD-10-CM

## 2013-01-26 DIAGNOSIS — E785 Hyperlipidemia, unspecified: Secondary | ICD-10-CM

## 2013-01-26 DIAGNOSIS — I10 Essential (primary) hypertension: Secondary | ICD-10-CM

## 2013-01-26 DIAGNOSIS — Z0000 Encounter for general adult medical examination without abnormal findings: Secondary | ICD-10-CM

## 2013-01-26 LAB — HEPATIC FUNCTION PANEL
ALT: 22 U/L (ref 0–53)
Total Bilirubin: 1.1 mg/dL (ref 0.3–1.2)

## 2013-01-26 LAB — BASIC METABOLIC PANEL
BUN: 14 mg/dL (ref 6–23)
CO2: 24 mEq/L (ref 19–32)
Chloride: 105 mEq/L (ref 96–112)
Glucose, Bld: 94 mg/dL (ref 70–99)
Potassium: 4 mEq/L (ref 3.5–5.1)

## 2013-01-26 LAB — CBC WITH DIFFERENTIAL/PLATELET
Eosinophils Absolute: 0.2 10*3/uL (ref 0.0–0.7)
Eosinophils Relative: 4.2 % (ref 0.0–5.0)
HCT: 43.7 % (ref 39.0–52.0)
Lymphs Abs: 1.9 10*3/uL (ref 0.7–4.0)
MCHC: 34.4 g/dL (ref 30.0–36.0)
MCV: 93.5 fl (ref 78.0–100.0)
Monocytes Absolute: 0.5 10*3/uL (ref 0.1–1.0)
Platelets: 243 10*3/uL (ref 150.0–400.0)
RDW: 12.9 % (ref 11.5–14.6)
WBC: 5.9 10*3/uL (ref 4.5–10.5)

## 2013-01-26 LAB — TSH: TSH: 1.22 u[IU]/mL (ref 0.35–5.50)

## 2013-01-26 LAB — LIPID PANEL
Cholesterol: 196 mg/dL (ref 0–200)
LDL Cholesterol: 142 mg/dL — ABNORMAL HIGH (ref 0–99)
Triglycerides: 79 mg/dL (ref 0.0–149.0)

## 2013-01-26 NOTE — Progress Notes (Signed)
Subjective:    Patient ID: Shane Haney, male    DOB: 12/21/1960, 52 y.o.   MRN: [id number]  HPI Pt here for cpe and labs.  No complaints.    Review of Systems Review of Systems  Constitutional: Negative for activity change, appetite change and fatigue.  HENT: Negative for hearing loss, congestion, tinnitus and ear discharge.  dentist q78m Eyes: Negative for visual disturbance (see optho q1y -- vision corrected to 20/20 with glasses).  Respiratory: Negative for cough, chest tightness and shortness of breath.   Cardiovascular: Negative for chest pain, palpitations and leg swelling.  Gastrointestinal: Negative for abdominal pain, diarrhea, constipation and abdominal distention.  Genitourinary: Negative for urgency, frequency, decreased urine volume and difficulty urinating.  Musculoskeletal: Negative for back pain, arthralgias and gait problem.  Skin: Negative for color change, pallor and rash.  Neurological: Negative for dizziness, light-headedness, numbness and headaches.  Hematological: Negative for adenopathy. Does not bruise/bleed easily.  Psychiatric/Behavioral: Negative for suicidal ideas, confusion, sleep disturbance, self-injury, dysphoric mood, decreased concentration and agitation.    Past Medical History  Diagnosis Date  . Hyperlipidemia   . Hypertension    Family History  Problem Relation Age of Onset  . Stroke Father   . Hypertension Father   . Alcohol abuse Father   . Hypertension Mother   . Aneurysm Mother     Brain  . HIV Brother     passed away from AIDS   History   Social History  . Marital Status: Married    Spouse Name: N/A    Number of Children: N/A  . Years of Education: N/A   Occupational History  . teacher--- [employer]   Social History Main Topics  . Smoking status: Never Smoker   . Smokeless tobacco: Never Used  . Alcohol Use: 3.6 oz/week    6 Cans of beer per week  . Drug Use: No  . Sexually Active: Yes -- Male  partner(s)   Other Topics Concern  . Not on file   Social History Narrative   Exercise-- 5-6 days a week   Current Outpatient Prescriptions on File Prior to Visit  Medication Sig Dispense Refill  . lisinopril (PRINIVIL,ZESTRIL) 20 MG tablet TAKE ONE TABLET BY MOUTH ONE TIME DAILY  90 tablet  0  . meloxicam (MOBIC) 15 MG tablet TAKE ONE TABLET BY MOUTH DAILY AS NEEDED  90 tablet  0   No current facility-administered medications on file prior to visit.         Objective:   Physical Exam BP 114/72  Pulse 58  Temp(Src) 98.7 F (37.1 C) (Oral)  Ht 5\' 10"  (1.778 m)  Wt 276 lb 12.8 oz (125.556 kg)  BMI 39.72 kg/m2  SpO2 95% General appearance: alert, cooperative, appears stated age and no distress Head: Normocephalic, without obvious abnormality, atraumatic Eyes: conjunctivae/corneas clear. PERRL, EOM's intact. Fundi benign. Ears: normal TM's and external ear canals both ears Nose: Nares normal. Septum midline. Mucosa normal. No drainage or sinus tenderness. Throat: lips, mucosa, and tongue normal; teeth and gums normal Neck: no adenopathy, no carotid bruit, no JVD, supple, symmetrical, trachea midline and thyroid not enlarged, symmetric, no tenderness/mass/nodules Back: symmetric, no curvature. ROM normal. No CVA tenderness. Lungs: clear to auscultation bilaterally Chest wall: no tenderness Heart: regular rate and rhythm, S1, S2 normal, no murmur, click, rub or gallop Abdomen: soft, non-tender; bowel sounds normal; no masses,  no organomegaly Male genitalia: penis: no lesions or discharge. testes: no masses or tenderness.  no hernias Rectal: normal tone, normal prostate, no masses or tenderness Extremities: extremities normal, atraumatic, no cyanosis or edema----- pain in knees, crepitus Pulses: 2+ and symmetric Skin: Skin color, texture, turgor normal. No rashes or lesions Lymph nodes: Cervical, supraclavicular, and axillary nodes normal. Neurologic: Alert and oriented X 3,  normal strength and tone. Normal symmetric reflexes. Normal coordination and gait Psych-- no anxiety, depression       Assessment & Plan:  cpe---check labs, ghm utd, see AVS

## 2013-01-26 NOTE — Patient Instructions (Signed)
Preventive Care for Adults, Male A healthy lifestyle and preventive care can promote health and wellness. Preventive health guidelines for men include the following key practices:  A routine yearly physical is a good way to check with your caregiver about your health and preventative screening. It is a chance to share any concerns and updates on your health, and to receive a thorough exam.  Visit your dentist for a routine exam and preventative care every 6 months. Brush your teeth twice a day and floss once a day. Good oral hygiene prevents tooth decay and gum disease.  The frequency of eye exams is based on your age, health, family medical history, use of contact lenses, and other factors. Follow your caregiver's recommendations for frequency of eye exams.  Eat a healthy diet. Foods like vegetables, fruits, whole grains, low-fat dairy products, and lean protein foods contain the nutrients you need without too many calories. Decrease your intake of foods high in solid fats, added sugars, and salt. Eat the right amount of calories for you.Get information about a proper diet from your caregiver, if necessary.  Regular physical exercise is one of the most important things you can do for your health. Most adults should get at least 150 minutes of moderate-intensity exercise (any activity that increases your heart rate and causes you to sweat) each week. In addition, most adults need muscle-strengthening exercises on 2 or more days a week.  Maintain a healthy weight. The body mass index (BMI) is a screening tool to identify possible weight problems. It provides an estimate of body fat based on height and weight. Your caregiver can help determine your BMI, and can help you achieve or maintain a healthy weight.For adults 20 years and older:  A BMI below 18.5 is considered underweight.  A BMI of 18.5 to 24.9 is normal.  A BMI of 25 to 29.9 is considered overweight.  A BMI of 30 and above is  considered obese.  Maintain normal blood lipids and cholesterol levels by exercising and minimizing your intake of saturated fat. Eat a balanced diet with plenty of fruit and vegetables. Blood tests for lipids and cholesterol should begin at age 20 and be repeated every 5 years. If your lipid or cholesterol levels are high, you are over 50, or you are a high risk for heart disease, you may need your cholesterol levels checked more frequently.Ongoing high lipid and cholesterol levels should be treated with medicines if diet and exercise are not effective.  If you smoke, find out from your caregiver how to quit. If you do not use tobacco, do not start.  If you choose to drink alcohol, do not exceed 2 drinks per day. One drink is considered to be 12 ounces (355 mL) of beer, 5 ounces (148 mL) of wine, or 1.5 ounces (44 mL) of liquor.  Avoid use of street drugs. Do not share needles with anyone. Ask for help if you need support or instructions about stopping the use of drugs.  High blood pressure causes heart disease and increases the risk of stroke. Your blood pressure should be checked at least every 1 to 2 years. Ongoing high blood pressure should be treated with medicines, if weight loss and exercise are not effective.  If you are 45 to 50 years old, ask your caregiver if you should take aspirin to prevent heart disease.  Diabetes screening involves taking a blood sample to check your fasting blood sugar level. This should be done once every 3 years,   after age 45, if you are within normal weight and without risk factors for diabetes. Testing should be considered at a younger age or be carried out more frequently if you are overweight and have at least 1 risk factor for diabetes.  Colorectal cancer can be detected and often prevented. Most routine colorectal cancer screening begins at the age of 50 and continues through age 75. However, your caregiver may recommend screening at an earlier age if you  have risk factors for colon cancer. On a yearly basis, your caregiver may provide home test kits to check for hidden blood in the stool. Use of a small camera at the end of a tube, to directly examine the colon (sigmoidoscopy or colonoscopy), can detect the earliest forms of colorectal cancer. Talk to your caregiver about this at age 50, when routine screening begins. Direct examination of the colon should be repeated every 5 to 10 years through age 75, unless early forms of pre-cancerous polyps or small growths are found.  Hepatitis C blood testing is recommended for all people born from 1945 through 1965 and any individual with known risks for hepatitis C.  Practice safe sex. Use condoms and avoid high-risk sexual practices to reduce the spread of sexually transmitted infections (STIs). STIs include gonorrhea, chlamydia, syphilis, trichomonas, herpes, HPV, and human immunodeficiency virus (HIV). Herpes, HIV, and HPV are viral illnesses that have no cure. They can result in disability, cancer, and death.  A one-time screening for abdominal aortic aneurysm (AAA) and surgical repair of large AAAs by sound wave imaging (ultrasonography) is recommended for ages 65 to 75 years who are current or former smokers.  Healthy men should no longer receive prostate-specific antigen (PSA) blood tests as part of routine cancer screening. Consult with your caregiver about prostate cancer screening.  Testicular cancer screening is not recommended for adult males who have no symptoms. Screening includes self-exam, caregiver exam, and other screening tests. Consult with your caregiver about any symptoms you have or any concerns you have about testicular cancer.  Use sunscreen with skin protection factor (SPF) of 30 or more. Apply sunscreen liberally and repeatedly throughout the day. You should seek shade when your shadow is shorter than you. Protect yourself by wearing long sleeves, pants, a wide-brimmed hat, and  sunglasses year round, whenever you are outdoors.  Once a month, do a whole body skin exam, using a mirror to look at the skin on your back. Notify your caregiver of new moles, moles that have irregular borders, moles that are larger than a pencil eraser, or moles that have changed in shape or color.  Stay current with required immunizations.  Influenza. You need a dose every fall (or winter). The composition of the flu vaccine changes each year, so being vaccinated once is not enough.  Pneumococcal polysaccharide. You need 1 to 2 doses if you smoke cigarettes or if you have certain chronic medical conditions. You need 1 dose at age 65 (or older) if you have never been vaccinated.  Tetanus, diphtheria, pertussis (Tdap, Td). Get 1 dose of Tdap vaccine if you are younger than age 65 years, are over 65 and have contact with an infant, are a healthcare worker, or simply want to be protected from whooping cough. After that, you need a Td booster dose every 10 years. Consult your caregiver if you have not had at least 3 tetanus and diphtheria-containing shots sometime in your life or have a deep or dirty wound.  HPV. This vaccine is recommended   for males 13 through 50 years of age. This vaccine may be given to men 22 through 50 years of age who have not completed the 3 dose series. It is recommended for men through age 26 who have sex with men or whose immune system is weakened because of HIV infection, other illness, or medications. The vaccine is given in 3 doses over 6 months.  Measles, mumps, rubella (MMR). You need at least 1 dose of MMR if you were born in 1957 or later. You may also need a 2nd dose.  Meningococcal. If you are age 19 to 21 years and a first-year college student living in a residence hall, or have one of several medical conditions, you need to get vaccinated against meningococcal disease. You may also need additional booster doses.  Zoster (shingles). If you are age 60 years or  older, you should get this vaccine.  Varicella (chickenpox). If you have never had chickenpox or you were vaccinated but received only 1 dose, talk to your caregiver to find out if you need this vaccine.  Hepatitis A. You need this vaccine if you have a specific risk factor for hepatitis A virus infection, or you simply wish to be protected from this disease. The vaccine is usually given as 2 doses, 6 to 18 months apart.  Hepatitis B. You need this vaccine if you have a specific risk factor for hepatitis B virus infection or you simply wish to be protected from this disease. The vaccine is given in 3 doses, usually over 6 months. Preventative Service / Frequency Ages 19 to 39  Blood pressure check.** / Every 1 to 2 years.  Lipid and cholesterol check.** / Every 5 years beginning at age 20.  Hepatitis C blood test.** / For any individual with known risks for hepatitis C.  Skin self-exam. / Monthly.  Influenza immunization.** / Every year.  Pneumococcal polysaccharide immunization.** / 1 to 2 doses if you smoke cigarettes or if you have certain chronic medical conditions.  Tetanus, diphtheria, pertussis (Tdap,Td) immunization. / A one-time dose of Tdap vaccine. After that, you need a Td booster dose every 10 years.  HPV immunization. / 3 doses over 6 months, if 26 and younger.  Measles, mumps, rubella (MMR) immunization. / You need at least 1 dose of MMR if you were born in 1957 or later. You may also need a 2nd dose.  Meningococcal immunization. / 1 dose if you are age 19 to 21 years and a first-year college student living in a residence hall, or have one of several medical conditions, you need to get vaccinated against meningococcal disease. You may also need additional booster doses.  Varicella immunization.** / Consult your caregiver.  Hepatitis A immunization.** / Consult your caregiver. 2 doses, 6 to 18 months apart.  Hepatitis B immunization.** / Consult your caregiver. 3 doses  usually over 6 months. Ages 40 to 64  Blood pressure check.** / Every 1 to 2 years.  Lipid and cholesterol check.** / Every 5 years beginning at age 20.  Fecal occult blood test (FOBT) of stool. / Every year beginning at age 50 and continuing until age 75. You may not have to do this test if you get colonoscopy every 10 years.  Flexible sigmoidoscopy** or colonoscopy.** / Every 5 years for a flexible sigmoidoscopy or every 10 years for a colonoscopy beginning at age 50 and continuing until age 75.  Hepatitis C blood test.** / For all people born from 1945 through 1965 and any   individual with known risks for hepatitis C.  Skin self-exam. / Monthly.  Influenza immunization.** / Every year.  Pneumococcal polysaccharide immunization.** / 1 to 2 doses if you smoke cigarettes or if you have certain chronic medical conditions.  Tetanus, diphtheria, pertussis (Tdap/Td) immunization.** / A one-time dose of Tdap vaccine. After that, you need a Td booster dose every 10 years.  Measles, mumps, rubella (MMR) immunization. / You need at least 1 dose of MMR if you were born in 1957 or later. You may also need a 2nd dose.  Varicella immunization.**/ Consult your caregiver.  Meningococcal immunization.** / Consult your caregiver.  Hepatitis A immunization.** / Consult your caregiver. 2 doses, 6 to 18 months apart.  Hepatitis B immunization.** / Consult your caregiver. 3 doses, usually over 6 months. Ages 65 and over  Blood pressure check.** / Every 1 to 2 years.  Lipid and cholesterol check.**/ Every 5 years beginning at age 20.  Fecal occult blood test (FOBT) of stool. / Every year beginning at age 50 and continuing until age 75. You may not have to do this test if you get colonoscopy every 10 years.  Flexible sigmoidoscopy** or colonoscopy.** / Every 5 years for a flexible sigmoidoscopy or every 10 years for a colonoscopy beginning at age 50 and continuing until age 75.  Hepatitis C blood  test.** / For all people born from 1945 through 1965 and any individual with known risks for hepatitis C.  Abdominal aortic aneurysm (AAA) screening.** / A one-time screening for ages 65 to 75 years who are current or former smokers.  Skin self-exam. / Monthly.  Influenza immunization.** / Every year.  Pneumococcal polysaccharide immunization.** / 1 dose at age 65 (or older) if you have never been vaccinated.  Tetanus, diphtheria, pertussis (Tdap, Td) immunization. / A one-time dose of Tdap vaccine if you are over 65 and have contact with an infant, are a healthcare worker, or simply want to be protected from whooping cough. After that, you need a Td booster dose every 10 years.  Varicella immunization. ** / Consult your caregiver.  Meningococcal immunization.** / Consult your caregiver.  Hepatitis A immunization. ** / Consult your caregiver. 2 doses, 6 to 18 months apart.  Hepatitis B immunization.** / Check with your caregiver. 3 doses, usually over 6 months. **Family history and personal history of risk and conditions may change your caregiver's recommendations. Document Released: 11/26/2001 Document Revised: 12/23/2011 Document Reviewed: 02/25/2011 [employer] Patient Information 2013 [employer].  

## 2013-01-26 NOTE — Assessment & Plan Note (Signed)
Stable con't meds 

## 2013-01-26 NOTE — Assessment & Plan Note (Signed)
Check labs 

## 2013-01-28 LAB — POCT URINALYSIS DIPSTICK
Bilirubin, UA: NEGATIVE
Blood, UA: NEGATIVE
Glucose, UA: NEGATIVE
Ketones, UA: NEGATIVE
Leukocytes, UA: NEGATIVE
Nitrite, UA: NEGATIVE
Protein, UA: NEGATIVE
Spec Grav, UA: <=1.005
Urobilinogen, UA: 0.2
pH, UA: 7

## 2013-02-24 ENCOUNTER — Other Ambulatory Visit: Payer: Self-pay | Admitting: Family Medicine

## 2013-02-24 NOTE — Telephone Encounter (Signed)
Last seen 01/26/13 and filled 12/02/12 #90. Please advise     KP

## 2013-02-25 ENCOUNTER — Other Ambulatory Visit: Payer: Self-pay | Admitting: Family Medicine

## 2013-03-11 ENCOUNTER — Other Ambulatory Visit: Payer: Self-pay | Admitting: Family Medicine

## 2013-03-11 NOTE — Telephone Encounter (Signed)
Last seen 01/16/13 and filled 02/24/13 #90. Please advise    KP

## 2013-04-19 ENCOUNTER — Ambulatory Visit (INDEPENDENT_AMBULATORY_CARE_PROVIDER_SITE_OTHER): Payer: BC Managed Care – PPO | Admitting: Family Medicine

## 2013-04-19 ENCOUNTER — Encounter: Payer: Self-pay | Admitting: Family Medicine

## 2013-04-19 VITALS — BP 132/80 | HR 65 | Temp 98.6°F | Wt 277.4 lb

## 2013-04-19 DIAGNOSIS — M542 Cervicalgia: Secondary | ICD-10-CM

## 2013-04-19 DIAGNOSIS — R200 Anesthesia of skin: Secondary | ICD-10-CM

## 2013-04-19 DIAGNOSIS — M25562 Pain in left knee: Secondary | ICD-10-CM | POA: Insufficient documentation

## 2013-04-19 DIAGNOSIS — R209 Unspecified disturbances of skin sensation: Secondary | ICD-10-CM

## 2013-04-19 DIAGNOSIS — M25569 Pain in unspecified knee: Secondary | ICD-10-CM

## 2013-04-19 MED ORDER — CYCLOBENZAPRINE HCL 10 MG PO TABS: 10.0000 mg | ORAL_TABLET | Freq: Three times a day (TID) | ORAL | Status: DC | PRN

## 2013-04-19 NOTE — Assessment & Plan Note (Signed)
Worsening and now constant--- with pain now in entire arm b/l L>R and in neck as well Splint given for L hand because it is worse Refer to ortho

## 2013-04-19 NOTE — Assessment & Plan Note (Signed)
F/u ortho Pain is worsening

## 2013-04-19 NOTE — Patient Instructions (Signed)
Carpal Tunnel Syndrome You may have carpal tunnel syndrome. This is a common condition. Carpal tunnel syndrome occurs when the tendons, bones, or ligaments in the wrist press against the median nerve as it passes into the hand.  Symptoms can include:  Intermittent numbness.   Pain or a tingling sensation in thumb and first two fingers.  The pain may radiate up to the shoulder. There may even be weakness in the hand muscles. The pain is often worse at night and in the early morning. Nerve conduction tests may be used to prove the diagnosis. Carpal tunnel syndrome is most often due to repeated movements of the hand or wrist. Other causes can include:  Prior injuries.   Diabetes.   Obesity.   Smoking.   Pregnancy. Symptoms that develop during pregnancy often stop when the pregnancy is over.  Treatment includes:  Splinting - A wrist splint helps prevent movements that irritate the nerve. Splints are especially helpful at night when the symptoms are often worse.   Ice packs - Cold packs applied to the palm side of the wrist for 20 minutes every 2 hours while awake may give some relief.   Medication - Medicine to reduce inflammation and pain are often used. Cortisone injections around the nerve may also bring improvement.  Severe cases of carpal tunnel syndrome can require surgery to relieve the pressure on the nerve. This may be necessary if there is evidence of weakness or decreased sensation in your hand, or if your symptoms do not improve with conservative treatment. See your caregiver for follow-up to be certain your condition is improving. Document Released: 11/07/2004 Document Revised: 06/12/2011 Document Reviewed: 08/06/2007 [facility] Patient Information 2012 [place], [place].

## 2013-04-19 NOTE — Progress Notes (Signed)
  Subjective:    Patient ID: Shane Haney, male    DOB: Sep 15, 1961, 52 y.o.   MRN: [id number]  HPI Pt here c/o numbness / pain in both hands for last year.  For last 2 1/2 weeks they stay numb and arms feel heavy like someone has put 10 lbs weights on arms.  Recently L foot has had some numbness.  Pt has had popping in neck.  Pt also c/o L leg being weak.     Review of Systems As above     Objective:   Physical Exam BP 132/80  Pulse 65  Temp(Src) 98.6 F (37 C) (Oral)  Wt 277 lb 6.4 oz (125.828 kg)  BMI 39.8 kg/m2  SpO2 96% General appearance: alert, cooperative, appears stated age and no distress Neck: no adenopathy, supple, symmetrical, trachea midline and thyroid not enlarged, symmetric, no tenderness/mass/nodules --popping in neck with movement Neurologic: Motor: grossly normal Reflexes: 2+ and symmetric Coordination: normal Gait: Normal       Assessment & Plan:

## 2013-05-07 ENCOUNTER — Encounter: Payer: Self-pay | Admitting: Gastroenterology

## 2013-05-14 HISTORY — PX: CARPAL TUNNEL RELEASE: SHX101

## 2013-05-19 ENCOUNTER — Telehealth: Payer: Self-pay | Admitting: Family Medicine

## 2013-05-19 NOTE — Telephone Encounter (Signed)
patient has been made aware and voiced understanding.      KP 

## 2013-05-19 NOTE — Telephone Encounter (Signed)
Please advise if the patient needs to stop taking the Meloxicam for his surgical procedure.   [place] patient         KP

## 2013-05-19 NOTE — Telephone Encounter (Signed)
yes

## 2013-05-19 NOTE — Telephone Encounter (Signed)
Patient states he is having a surgical procedure done next week with [facility]. They have told him not to take medications that contain aspirin. He is currently on meloxicam and would like to know if he should stop this for his surgery.

## 2013-05-31 ENCOUNTER — Encounter: Payer: Self-pay | Admitting: Gastroenterology

## 2013-06-04 ENCOUNTER — Encounter: Payer: Self-pay | Admitting: Family Medicine

## 2013-06-04 ENCOUNTER — Ambulatory Visit (INDEPENDENT_AMBULATORY_CARE_PROVIDER_SITE_OTHER): Payer: BC Managed Care – PPO | Admitting: Family Medicine

## 2013-06-04 VITALS — BP 122/86 | HR 65 | Temp 98.5°F | Wt 275.2 lb

## 2013-06-04 DIAGNOSIS — IMO0001 Reserved for inherently not codable concepts without codable children: Secondary | ICD-10-CM

## 2013-06-04 DIAGNOSIS — G5600 Carpal tunnel syndrome, unspecified upper limb: Secondary | ICD-10-CM

## 2013-06-04 DIAGNOSIS — M2550 Pain in unspecified joint: Secondary | ICD-10-CM

## 2013-06-04 DIAGNOSIS — M4802 Spinal stenosis, cervical region: Secondary | ICD-10-CM

## 2013-06-04 LAB — CBC WITH DIFFERENTIAL/PLATELET
Basophils Absolute: 0 10*3/uL (ref 0.0–0.1)
Basophils Relative: 0.5 % (ref 0.0–3.0)
Eosinophils Absolute: 0.1 10*3/uL (ref 0.0–0.7)
HCT: 46.6 % (ref 39.0–52.0)
Hemoglobin: 16.1 g/dL (ref 13.0–17.0)
Lymphocytes Relative: 27.9 % (ref 12.0–46.0)
Lymphs Abs: 2 10*3/uL (ref 0.7–4.0)
MCHC: 34.5 g/dL (ref 30.0–36.0)
Neutro Abs: 4.5 10*3/uL (ref 1.4–7.7)
RBC: 5 Mil/uL (ref 4.22–5.81)
RDW: 12.9 % (ref 11.5–14.6)

## 2013-06-04 LAB — HEPATIC FUNCTION PANEL
AST: 21 U/L (ref 0–37)
Albumin: 4.2 g/dL (ref 3.5–5.2)
Alkaline Phosphatase: 55 U/L (ref 39–117)
Total Protein: 6.8 g/dL (ref 6.0–8.3)

## 2013-06-04 LAB — BASIC METABOLIC PANEL
CO2: 27 mEq/L (ref 19–32)
Chloride: 103 mEq/L (ref 96–112)
Glucose, Bld: 99 mg/dL (ref 70–99)
Potassium: 3.6 mEq/L (ref 3.5–5.1)
Sodium: 136 mEq/L (ref 135–145)

## 2013-06-04 NOTE — Patient Instructions (Signed)
Cervical Radiculopathy  Cervical radiculopathy happens when a nerve in the neck is pinched or bruised by a slipped (herniated) disk or by arthritic changes in the bones of the cervical spine. This can occur due to an injury or as part of the normal aging process. Pressure on the cervical nerves can cause pain or numbness that runs from your neck all the way down into your arm and fingers.  CAUSES   There are many possible causes, including:   Injury.   Muscle tightness in the neck from overuse.   Swollen, painful joints (arthritis).   Breakdown or degeneration in the bones and joints of the spine (spondylosis) due to aging.   Bone spurs that may develop near the cervical nerves.  SYMPTOMS   Symptoms include pain, weakness, or numbness in the affected arm and hand. Pain can be severe or irritating. Symptoms may be worse when extending or turning the neck.  DIAGNOSIS   Your caregiver will ask about your symptoms and do a physical exam. He or she may test your strength and reflexes. X-rays, CT scans, and MRI scans may be needed in cases of injury or if the symptoms do not go away after a period of time. Electromyography (EMG) or nerve conduction testing may be done to study how your nerves and muscles are working.  TREATMENT   Your caregiver may recommend certain exercises to help relieve your symptoms. Cervical radiculopathy can, and often does, get better with time and treatment. If your problems continue, treatment options may include:   Wearing a soft collar for short periods of time.   Physical therapy to strengthen the neck muscles.   Medicines, such as nonsteroidal anti-inflammatory drugs (NSAIDs), oral corticosteroids, or spinal injections.   Surgery. Different types of surgery may be done depending on the cause of your problems.  HOME CARE INSTRUCTIONS    Put ice on the affected area.   Put ice in a plastic bag.   Place a towel between your skin and the bag.    Leave the ice on for 15-20 minutes, 3-4 times a day or as directed by your caregiver.   If ice does not help, you can try using heat. Take a warm shower or bath, or use a hot water bottle as directed by your caregiver.   You may try a gentle neck and shoulder massage.   Use a flat pillow when you sleep.   Only take over-the-counter or prescription medicines for pain, discomfort, or fever as directed by your caregiver.   If physical therapy was prescribed, follow your caregiver's directions.   If a soft collar was prescribed, use it as directed.  SEEK IMMEDIATE MEDICAL CARE IF:    Your pain gets much worse and cannot be controlled with medicines.   You have weakness or numbness in your hand, arm, face, or leg.   You have a high fever or a stiff, rigid neck.   You lose bowel or bladder control (incontinence).   You have trouble with walking, balance, or speaking.  MAKE SURE YOU:    Understand these instructions.   Will watch your condition.   Will get help right away if you are not doing well or get worse.  Document Released: 06/25/2001 Document Revised: 12/23/2011 Document Reviewed: 05/14/2011  [employer] Patient Information 2014 [employer].

## 2013-06-05 ENCOUNTER — Encounter: Payer: Self-pay | Admitting: Family Medicine

## 2013-06-05 DIAGNOSIS — E669 Obesity, unspecified: Secondary | ICD-10-CM | POA: Insufficient documentation

## 2013-06-05 DIAGNOSIS — IMO0001 Reserved for inherently not codable concepts without codable children: Secondary | ICD-10-CM | POA: Insufficient documentation

## 2013-06-05 DIAGNOSIS — M4802 Spinal stenosis, cervical region: Secondary | ICD-10-CM | POA: Insufficient documentation

## 2013-06-05 DIAGNOSIS — G5600 Carpal tunnel syndrome, unspecified upper limb: Secondary | ICD-10-CM | POA: Insufficient documentation

## 2013-06-05 NOTE — Assessment & Plan Note (Signed)
Pt will call [facility] for second opinion

## 2013-06-05 NOTE — Assessment & Plan Note (Signed)
Check labs at pt request including r/o diabetes secondary to family history

## 2013-06-05 NOTE — Assessment & Plan Note (Signed)
Per ortho--pt due to have surgery on R hand

## 2013-06-05 NOTE — Progress Notes (Signed)
  Subjective:    Patient ID: ENRICO EADDY, male    DOB: 13-Oct-1961, 52 y.o.   MRN: [id number]  HPI Pt here f/u visit with ortho.  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  He is supposed to have it on his R wrist as well but was also dx with cervical stenosis and is supposed to have surgery on his neck.  He would like a second opinion and has the names of some of the surgeons at [facility].  He complains about multiple joint pains and myalgias and would like other thiings ruled out as well.   No new complaints.    Review of Systems As above    Objective:   Physical Exam BP 122/86  Pulse 65  Temp(Src) 98.5 F (36.9 C) (Oral)  Wt 275 lb 3.2 oz (124.83 kg)  BMI 39.49 kg/m2  SpO2 96% General appearance: alert, cooperative, appears stated age and no distress Neck: no adenopathy, no carotid bruit, no JVD, supple, symmetrical, trachea midline and thyroid not enlarged, symmetric, no tenderness/mass/nodules Extremities: extremities normal, atraumatic, no cyanosis or edema Neurologic: Alert and oriented X 3, normal strength and tone. Normal symmetric reflexes. Normal coordination and gait        Assessment & Plan:

## 2013-06-07 LAB — VITAMIN B12: Vitamin B-12: 351 pg/mL (ref 211–911)

## 2013-06-07 LAB — ANA: Anti Nuclear Antibody(ANA): NEGATIVE

## 2013-06-09 LAB — VITAMIN D 1,25 DIHYDROXY
Vitamin D2 1, 25 (OH)2: <8 pg/mL
Vitamin D3 1, 25 (OH)2: 52 pg/mL

## 2013-06-24 ENCOUNTER — Ambulatory Visit: Payer: BC Managed Care – PPO

## 2013-06-24 DIAGNOSIS — Z01818 Encounter for other preprocedural examination: Secondary | ICD-10-CM

## 2013-06-28 NOTE — H&P (Signed)
History of Present Illness  The patient is a 52 year old male who presents with neck pain. The patient reports symptoms involving the right posterior neck which began 3 month(s) ago. The symptoms began without any known injury (used to use a rowing machine and not sure if this activity has anything to do with his symptoms). Symptoms include neck pain, neck stiffness (when he turns his head upward he feels soreness and tingling and pins and needles radiating down bilateral sides of his body into his toes), shoulder pain (bilateral and some tightness), numbness (numbness in bilateral arms radiating into hands, down back into bilateral legs radiating down into bilateral toes), tingling (patient feels in bilateral arms, back, and bilateral legs radiating into toes), arm numbness, upper extremity weakness (feels like arms are heavy in bilateral arms), weakness (bilateral arms and legs, feels fatigued) and headaches (mostly in the morning when he gets up). The patient describes the pain as sharp, dull (feels like pins and needles), aching, burning and stinging.The patient describes their symptoms as moderate in severity.The patient does feel that the symptoms are worsening. Current treatment includes non-opioid analgesics (Meloxicam, takes for his knees). Past evaluation has included cervical spine MRI (done on 05/07/2013).   Subjective Transcription  He returns today at my request for further evaluation.    Allergies No Known Allergies. 04/27/2013   Social History Number of flights of stairs before winded. 4-5 Marital status. married Living situation. live with spouse Tobacco use. never smoker Tobacco / smoke exposure. no Pain Contract. no Illicit drug use. no Current work status. working full time Children. 2 Alcohol use. current drinker; drinks hard liquor; 5-7 per week Exercise. Exercises daily; does other and gym / weights Drug/Alcohol Rehab (Previously).  no Drug/Alcohol Rehab (Currently). no   Medication History Meloxicam (7.5MG  Tablet, Oral) Active. Vitamin C (1000MG  Tablet, 1 Oral daily) Active. Vitamin B6 (200MG  Tablet, 1 Oral daily) Active. Meloxicam (15MG  Tablet, 1 Oral daily) Active. Lisinopril (20MG  Tablet, 1 Oral daily) Active.   Objective Transcription  On clinical exam, he has difficulty maintaining his balance with tandem heel-toe walking. He has a positive Romberg's sign with loss of balance with his eyes closed. He has an unequivocal Babinski and Hoffman test. There are brisk DTR's in the upper and lower extremity bilaterally. There is no real shoulder, elbow, wrist, knee, hip pain with joint range of motion. He has good 5/5 strength in the upper extremity. There is a splint on the left hand and wrist so I can not access his wrist range of motion on that left side.  RADIOGRAPHS:  The MRI and X-rays were reviewed of his neck. The X-rays show a loss of the normal cervical lordosis and mild multi-level degenerative changes. No fracture, subluxation or dislocation. The MRI shows a severe spinal stenosis at C4-5 with cord signal changes with biforaminal compression, right side worse than the left. There is a Grade 1 slip at L4-5 but this is very minimal, probably due to his facet arthrosis. The left side is worse than the right. He has mild disk bulges and degenerative changes at C5-6 and C6-7. Again, the notice of concern is the T2 signal change at the cord at C3 and C4. This is likely due to the compression causing myelomalacia.   Assessment & Plan  Plans Transcription  The area of concern is the T2 signal change at the cord at C3 and C4. This is likely due to the compression causing myelomalacia.At this point I am concerned about  the 3-4 level but there are also issues at 4-5. At this point, even though there are some mild degenerative changes at 5-6 and 6-7, I think the two areas that are problematic are 3-4 and 4-5.  We did discuss perhaps doing a 4-5 injection to temporarily reduce some of his pain, but he would like to think about just proceeding with the surgery vs. 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 If we decide to proceed with surgery, I will coordinate the surgical procedure with Dr. Amanda Pea so we can also get the right carpal tunnel done as well. He will let me know Monday how he would like to proceed.   At this point in time, I spoke with his wife via cell phone and the patient. We have gone over the MRI again. We have gone over the natural history of myelopathy. I also had an opportunity to speak with Dr. Amanda Pea, and he did indicate that if we were to do something on the neck, he could do the right carpal tunnel following that cervical procedure. I have addressed all the risks and benefits of surgery which include infection, bleeding, nerve damage, death, stroke, paralysis, failure to heal, need for further surgery, ongoing or worse pain, loss of bowel and bladder control, spinal cord injury and, if it doesn't fuse, the need for posterior supplemental fixation.

## 2013-07-01 ENCOUNTER — Encounter (HOSPITAL_COMMUNITY): Payer: Self-pay

## 2013-07-02 ENCOUNTER — Other Ambulatory Visit: Payer: Self-pay | Admitting: Orthopedic Surgery

## 2013-07-05 ENCOUNTER — Other Ambulatory Visit (HOSPITAL_COMMUNITY): Payer: Self-pay | Admitting: *Deleted

## 2013-07-05 NOTE — Pre-Procedure Instructions (Signed)
Shane Haney  07/05/2013   Your procedure is scheduled on:  July 08, 2013 at 12:30 PM  Report to [facility] Main Entrance "A" at 10:30 AM.  Call this number if you have problems the morning of surgery: [phone]   Remember:   Do not eat food or drink liquids after midnight. On WEDNESDAY  Take these medicines the morning of surgery with A SIP OF WATER: None  Stop all Vitamins, Herbal Medications, Aspirin and NSAIDS (Mobic, Motrin, Aleve, etc.) as of today 07/06/13   Do not wear jewelry  Do not wear lotions, powders, or cologne. You may wear deodorant.   Men may shave face and neck.  Do not bring valuables to the hospital.  [facility] is not responsible                   for any belongings or valuables.  Contacts, dentures or bridgework may not be worn into surgery.  Leave suitcase in the car. After surgery it may be brought to your room.  For patients admitted to the hospital, checkout time is 11:00 AM the day of  discharge.    Special Instructions: Shower using CHG 2 nights before surgery and the night before surgery.  If you shower the day of surgery use CHG.  Use special wash - you have one bottle of CHG for all showers.  You should use approximately 1/3 of the bottle for each shower.   Please read over the following fact sheets that you were given: Pain Booklet, Coughing and Deep Breathing, MRSA Information and Surgical Site Infection Prevention

## 2013-07-06 ENCOUNTER — Encounter (HOSPITAL_COMMUNITY)
Admission: RE | Admit: 2013-07-06 | Discharge: 2013-07-06 | Disposition: A | Discharge status: Home / Self Care | Payer: BC Managed Care – PPO | Source: Ambulatory Visit | Attending: Orthopedic Surgery | Admitting: Orthopedic Surgery

## 2013-07-06 ENCOUNTER — Encounter (HOSPITAL_COMMUNITY): Payer: Self-pay

## 2013-07-06 HISTORY — DX: Personal history of other medical treatment: Z92.89

## 2013-07-06 HISTORY — DX: Myoneural disorder, unspecified: G70.9

## 2013-07-06 LAB — BASIC METABOLIC PANEL
CO2: 24 mEq/L (ref 19–32)
GFR calc non Af Amer: >90 mL/min (ref >90)
Glucose, Bld: 100 mg/dL — ABNORMAL HIGH (ref 70–99)
Potassium: 4.3 mEq/L (ref 3.5–5.1)
Sodium: 136 mEq/L (ref 135–145)

## 2013-07-06 LAB — CBC
Hemoglobin: 16.1 g/dL (ref 13.0–17.0)
MCH: 32.9 pg (ref 26.0–34.0)
MCV: 89.4 fL (ref 78.0–100.0)
Platelets: 255 10*3/uL (ref 150–400)
RBC: 4.89 MIL/uL (ref 4.22–5.81)
WBC: 6.4 10*3/uL (ref 4.0–10.5)

## 2013-07-06 LAB — SURGICAL PCR SCREEN: MRSA, PCR: NEGATIVE

## 2013-07-06 NOTE — Progress Notes (Signed)
07/06/13 1559  OBSTRUCTIVE SLEEP APNEA  Have you ever been diagnosed with sleep apnea through a sleep study? No  Do you snore loudly (loud enough to be heard through closed doors)?  1  Do you often feel tired, fatigued, or sleepy during the daytime? 0  Has anyone observed you stop breathing during your sleep? 0  Do you have, or are you being treated for high blood pressure? 1  BMI more than 35 kg/m2? 1  Age over 50 years old? 0  Neck circumference greater than 40 cm/18 inches? 1  Gender: 1  Obstructive Sleep Apnea Score 5  Score 4 or greater  Results sent to PCP

## 2013-07-06 NOTE — Progress Notes (Signed)
Call to [facility] office, requested recent ekg.  Ekg was not saved due to tech. Problem.

## 2013-07-06 NOTE — Progress Notes (Signed)
Anesthesia Chart Review:  Patient is a 52 year old male scheduled for C3-5 ACDF on 07/08/13 by Dr. Shon Baton and limited open right carpal tunnel release by Dr. Amanda Pea.  History includes obesity, non-smoker, HLD, HTN, tonsillectomy. OSA screening score was 5. PCP is Dr. Loreen Freud.  He is not followed by cardiology, but saw Dr. 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  He was asymptomatic with normal LVEF, so no further work-up was recommended at that time.  EKG on 07/06/13 showed: SB, right BBB.  Echo on 04/12/09 showed: Left ventricle: The cavity size was normal. Wall thickness was increased in a pattern of mild LVH. The estimated ejection fraction was 60%. Wall motion was normal; there were no regional wall motion abnormalities. Mild TR.  Preoperative CXR and labs noted.  Anticipate that he can proceed as planned.  Velna Ochs [facility] Stay [facility]/Anesthesiology Phone [phone] 07/06/2013 5:32 PM

## 2013-07-06 NOTE — Progress Notes (Signed)
Call to A. Zelenak,PA, reported R BBB on ekg today & previous ekg. She will review the chart.

## 2013-07-06 NOTE — Progress Notes (Signed)
Spoke with Marchelle Folks at [facility] ortho., asked that MD call Rx into pharm. For mupirocin in light of + staph on PCR screen.

## 2013-07-07 ENCOUNTER — Telehealth: Payer: Self-pay

## 2013-07-07 MED ORDER — CHLORHEXIDINE GLUCONATE 4 % EX LIQD: 60.0000 mL | Freq: Once | CUTANEOUS | Status: DC

## 2013-07-07 MED ORDER — DEXTROSE 5 % IV SOLN
3.0000 g | INTRAVENOUS | Status: AC
  Administered 2013-07-08 (×2): 3 g via INTRAVENOUS
  Filled 2013-07-07: qty 3000

## 2013-07-07 MED ORDER — DEXAMETHASONE SODIUM PHOSPHATE 4 MG/ML IJ SOLN
4.0000 mg | Freq: Once | INTRAMUSCULAR | Status: AC
  Administered 2013-07-08: 8 mg via INTRAVENOUS
  Administered 2013-07-08: 4 mg via INTRAVENOUS
  Filled 2013-07-07: qty 1

## 2013-07-07 MED ORDER — ACETAMINOPHEN 10 MG/ML IV SOLN
1000.0000 mg | Freq: Four times a day (QID) | INTRAVENOUS | Status: DC
  Filled 2013-07-07: qty 100

## 2013-07-07 NOTE — Telephone Encounter (Signed)
Pt needs pulm referral for sleep eval--- Dx sleep apnea -----   Message ----- From: Sedonia Small, RN  Sent: 07/06/2013 4:02 PM  To: Lelon Perla, DO  Subject:   Inpatient Notes Please see sleep assessment tool that occurred at preadmission visit, in preparation for surgery

## 2013-07-07 NOTE — Telephone Encounter (Signed)
I tried calling the patient to make him aware and LMTC.     KP

## 2013-07-08 ENCOUNTER — Ambulatory Visit (HOSPITAL_COMMUNITY): Payer: BC Managed Care – PPO

## 2013-07-08 ENCOUNTER — Encounter (HOSPITAL_COMMUNITY): Payer: Self-pay | Admitting: Vascular Surgery

## 2013-07-08 ENCOUNTER — Observation Stay (HOSPITAL_COMMUNITY): Payer: BC Managed Care – PPO

## 2013-07-08 ENCOUNTER — Ambulatory Visit (HOSPITAL_COMMUNITY): Payer: BC Managed Care – PPO | Admitting: Anesthesiology

## 2013-07-08 ENCOUNTER — Encounter (HOSPITAL_COMMUNITY)
Admission: RE | Disposition: A | Discharge status: Home / Self Care | Payer: Self-pay | Source: Ambulatory Visit | Attending: Orthopedic Surgery

## 2013-07-08 ENCOUNTER — Observation Stay (HOSPITAL_COMMUNITY)
Admission: RE | Admit: 2013-07-08 | Discharge: 2013-07-09 | Disposition: A | Discharge status: Home / Self Care | Payer: BC Managed Care – PPO | Source: Ambulatory Visit | Attending: Orthopedic Surgery | Admitting: Orthopedic Surgery

## 2013-07-08 ENCOUNTER — Encounter (HOSPITAL_COMMUNITY): Payer: Self-pay | Admitting: Critical Care Medicine

## 2013-07-08 DIAGNOSIS — Z01818 Encounter for other preprocedural examination: Secondary | ICD-10-CM | POA: Insufficient documentation

## 2013-07-08 DIAGNOSIS — G5600 Carpal tunnel syndrome, unspecified upper limb: Secondary | ICD-10-CM | POA: Insufficient documentation

## 2013-07-08 DIAGNOSIS — Z01812 Encounter for preprocedural laboratory examination: Secondary | ICD-10-CM | POA: Insufficient documentation

## 2013-07-08 DIAGNOSIS — M4712 Other spondylosis with myelopathy, cervical region: Principal | ICD-10-CM | POA: Insufficient documentation

## 2013-07-08 DIAGNOSIS — Z981 Arthrodesis status: Secondary | ICD-10-CM

## 2013-07-08 DIAGNOSIS — Z01810 Encounter for preprocedural cardiovascular examination: Secondary | ICD-10-CM | POA: Insufficient documentation

## 2013-07-08 DIAGNOSIS — I10 Essential (primary) hypertension: Secondary | ICD-10-CM | POA: Insufficient documentation

## 2013-07-08 HISTORY — PX: CARPAL TUNNEL RELEASE: SHX101

## 2013-07-08 HISTORY — PX: ANTERIOR CERVICAL DECOMP/DISCECTOMY FUSION: SHX1161

## 2013-07-08 SURGERY — ANTERIOR CERVICAL DECOMPRESSION/DISCECTOMY FUSION 2 LEVELS
Anesthesia: General | Site: Wrist | Laterality: Right | Wound class: Clean

## 2013-07-08 MED ORDER — HYDROMORPHONE HCL PF 1 MG/ML IJ SOLN
INTRAMUSCULAR | Status: AC
  Administered 2013-07-08: 0.5 mg via INTRAVENOUS
  Filled 2013-07-08: qty 1

## 2013-07-08 MED ORDER — MENTHOL 3 MG MT LOZG: 1.0000 | LOZENGE | OROMUCOSAL | Status: DC | PRN

## 2013-07-08 MED ORDER — FENTANYL CITRATE 0.05 MG/ML IJ SOLN: 50.0000 ug | Freq: Once | INTRAMUSCULAR | Status: DC

## 2013-07-08 MED ORDER — SODIUM CHLORIDE 0.9 % IV SOLN: 250.0000 mL | INTRAVENOUS | Status: DC

## 2013-07-08 MED ORDER — PROMETHAZINE HCL 25 MG/ML IJ SOLN: 6.2500 mg | INTRAMUSCULAR | Status: DC | PRN

## 2013-07-08 MED ORDER — VECURONIUM BROMIDE 10 MG IV SOLR
INTRAVENOUS | Status: DC | PRN
  Administered 2013-07-08: 4 mg via INTRAVENOUS

## 2013-07-08 MED ORDER — ONDANSETRON HCL 4 MG/2ML IJ SOLN: 4.0000 mg | INTRAMUSCULAR | Status: DC | PRN

## 2013-07-08 MED ORDER — THROMBIN 20000 UNITS EX SOLR
CUTANEOUS | Status: DC | PRN
  Administered 2013-07-08: 16:00:00 via TOPICAL

## 2013-07-08 MED ORDER — MORPHINE SULFATE 2 MG/ML IJ SOLN
1.0000 mg | INTRAMUSCULAR | Status: DC | PRN
  Administered 2013-07-09: 2 mg via INTRAVENOUS
  Administered 2013-07-09: 1 mg via INTRAVENOUS
  Filled 2013-07-08 (×2): qty 1

## 2013-07-08 MED ORDER — PHENOL 1.4 % MT LIQD: 1.0000 | OROMUCOSAL | Status: DC | PRN

## 2013-07-08 MED ORDER — MIDAZOLAM HCL 2 MG/2ML IJ SOLN: 1.0000 mg | INTRAMUSCULAR | Status: DC | PRN

## 2013-07-08 MED ORDER — CEFAZOLIN SODIUM 1-5 GM-% IV SOLN
INTRAVENOUS | Status: AC
  Filled 2013-07-08: qty 50

## 2013-07-08 MED ORDER — FENTANYL CITRATE 0.05 MG/ML IJ SOLN
INTRAMUSCULAR | Status: DC | PRN
  Administered 2013-07-08 (×5): 50 ug via INTRAVENOUS
  Administered 2013-07-08: 150 ug via INTRAVENOUS
  Administered 2013-07-08 (×2): 50 ug via INTRAVENOUS

## 2013-07-08 MED ORDER — CEFAZOLIN SODIUM-DEXTROSE 2-3 GM-% IV SOLR
INTRAVENOUS | Status: AC
  Filled 2013-07-08: qty 50

## 2013-07-08 MED ORDER — SODIUM CHLORIDE 0.9 % IJ SOLN: 3.0000 mL | INTRAMUSCULAR | Status: DC | PRN

## 2013-07-08 MED ORDER — ONDANSETRON HCL 4 MG/2ML IJ SOLN
INTRAMUSCULAR | Status: DC | PRN
  Administered 2013-07-08: 4 mg via INTRAVENOUS

## 2013-07-08 MED ORDER — NEOSTIGMINE METHYLSULFATE 1 MG/ML IJ SOLN
INTRAMUSCULAR | Status: DC | PRN
  Administered 2013-07-08: 5 mg via INTRAVENOUS

## 2013-07-08 MED ORDER — DEXTROSE 5 % IV SOLN
INTRAVENOUS | Status: DC | PRN
  Administered 2013-07-08: 15:00:00 via INTRAVENOUS

## 2013-07-08 MED ORDER — GLYCOPYRROLATE 0.2 MG/ML IJ SOLN
INTRAMUSCULAR | Status: DC | PRN
  Administered 2013-07-08: 0.6 mg via INTRAVENOUS

## 2013-07-08 MED ORDER — DEXAMETHASONE SODIUM PHOSPHATE 4 MG/ML IJ SOLN
4.0000 mg | Freq: Four times a day (QID) | INTRAMUSCULAR | Status: DC
  Filled 2013-07-08 (×7): qty 1

## 2013-07-08 MED ORDER — MIDAZOLAM HCL 5 MG/5ML IJ SOLN
INTRAMUSCULAR | Status: DC | PRN
  Administered 2013-07-08: 2 mg via INTRAVENOUS

## 2013-07-08 MED ORDER — LACTATED RINGERS IV SOLN
INTRAVENOUS | Status: DC
  Administered 2013-07-09: 01:00:00 via INTRAVENOUS

## 2013-07-08 MED ORDER — BUPIVACAINE-EPINEPHRINE PF 0.25-1:200000 % IJ SOLN
INTRAMUSCULAR | Status: AC
  Filled 2013-07-08: qty 30

## 2013-07-08 MED ORDER — OXYCODONE HCL 5 MG PO TABS: 5.0000 mg | ORAL_TABLET | Freq: Once | ORAL | Status: DC | PRN

## 2013-07-08 MED ORDER — OXYCODONE HCL 5 MG PO TABS
10.0000 mg | ORAL_TABLET | ORAL | Status: DC | PRN
  Administered 2013-07-08: 10 mg via ORAL
  Filled 2013-07-08: qty 2

## 2013-07-08 MED ORDER — METHOCARBAMOL 500 MG PO TABS
500.0000 mg | ORAL_TABLET | Freq: Four times a day (QID) | ORAL | Status: DC | PRN
  Administered 2013-07-08 – 2013-07-09 (×2): 500 mg via ORAL
  Filled 2013-07-08 (×3): qty 1

## 2013-07-08 MED ORDER — DEXAMETHASONE 4 MG PO TABS
4.0000 mg | ORAL_TABLET | Freq: Four times a day (QID) | ORAL | Status: DC
  Administered 2013-07-08 – 2013-07-09 (×3): 4 mg via ORAL
  Filled 2013-07-08 (×7): qty 1

## 2013-07-08 MED ORDER — HYDROMORPHONE HCL PF 1 MG/ML IJ SOLN
0.2500 mg | INTRAMUSCULAR | Status: DC | PRN
  Administered 2013-07-08 (×2): 0.5 mg via INTRAVENOUS

## 2013-07-08 MED ORDER — LIDOCAINE HCL (CARDIAC) 20 MG/ML IV SOLN
INTRAVENOUS | Status: DC | PRN
  Administered 2013-07-08 (×2): 100 mg via INTRAVENOUS

## 2013-07-08 MED ORDER — ACETAMINOPHEN 10 MG/ML IV SOLN
INTRAVENOUS | Status: DC | PRN
  Administered 2013-07-08: 1000 mg via INTRAVENOUS

## 2013-07-08 MED ORDER — SODIUM CHLORIDE 0.9 % IJ SOLN: 3.0000 mL | Freq: Two times a day (BID) | INTRAMUSCULAR | Status: DC

## 2013-07-08 MED ORDER — METHOCARBAMOL 100 MG/ML IJ SOLN
500.0000 mg | Freq: Four times a day (QID) | INTRAVENOUS | Status: DC | PRN
  Filled 2013-07-08: qty 5

## 2013-07-08 MED ORDER — CEFAZOLIN SODIUM 1-5 GM-% IV SOLN
1.0000 g | Freq: Three times a day (TID) | INTRAVENOUS | Status: DC
  Administered 2013-07-09: 1 g via INTRAVENOUS
  Filled 2013-07-08 (×2): qty 50

## 2013-07-08 MED ORDER — HEMOSTATIC AGENTS (NO CHARGE) OPTIME
TOPICAL | Status: DC | PRN
  Administered 2013-07-08: 1 via TOPICAL

## 2013-07-08 MED ORDER — LISINOPRIL 20 MG PO TABS
20.0000 mg | ORAL_TABLET | Freq: Every day | ORAL | Status: DC
  Administered 2013-07-09: 20 mg via ORAL
  Filled 2013-07-08 (×2): qty 1

## 2013-07-08 MED ORDER — ZOLPIDEM TARTRATE 5 MG PO TABS: 5.0000 mg | ORAL_TABLET | Freq: Every evening | ORAL | Status: DC | PRN

## 2013-07-08 MED ORDER — LACTATED RINGERS IV SOLN
INTRAVENOUS | Status: DC
  Administered 2013-07-08 (×3): via INTRAVENOUS

## 2013-07-08 MED ORDER — OXYCODONE HCL 5 MG/5ML PO SOLN: 5.0000 mg | Freq: Once | ORAL | Status: DC | PRN

## 2013-07-08 MED ORDER — THROMBIN 20000 UNITS EX SOLR
CUTANEOUS | Status: AC
  Filled 2013-07-08: qty 20000

## 2013-07-08 MED ORDER — PROPOFOL 10 MG/ML IV BOLUS
INTRAVENOUS | Status: DC | PRN
  Administered 2013-07-08: 200 mg via INTRAVENOUS

## 2013-07-08 MED ORDER — BUPIVACAINE HCL (PF) 0.25 % IJ SOLN
INTRAMUSCULAR | Status: DC | PRN
  Administered 2013-07-08: 3 mL

## 2013-07-08 MED ORDER — ACETAMINOPHEN 10 MG/ML IV SOLN
1000.0000 mg | Freq: Four times a day (QID) | INTRAVENOUS | Status: DC
  Administered 2013-07-09 (×2): 1000 mg via INTRAVENOUS
  Filled 2013-07-08 (×6): qty 100

## 2013-07-08 MED ORDER — ARTIFICIAL TEARS OP OINT
TOPICAL_OINTMENT | OPHTHALMIC | Status: DC | PRN
  Administered 2013-07-08: 1 via OPHTHALMIC

## 2013-07-08 MED ORDER — 0.9 % SODIUM CHLORIDE (POUR BTL) OPTIME
TOPICAL | Status: DC | PRN
  Administered 2013-07-08: 1000 mL

## 2013-07-08 MED ORDER — ROCURONIUM BROMIDE 100 MG/10ML IV SOLN
INTRAVENOUS | Status: DC | PRN
  Administered 2013-07-08: 50 mg via INTRAVENOUS

## 2013-07-08 SURGICAL SUPPLY — 97 items
BANDAGE ELASTIC 3 VELCRO ST LF (GAUZE/BANDAGES/DRESSINGS) ×3 IMPLANT
BANDAGE ELASTIC 4 VELCRO ST LF (GAUZE/BANDAGES/DRESSINGS) ×3 IMPLANT
BANDAGE GAUZE ELAST BULKY 4 IN (GAUZE/BANDAGES/DRESSINGS) ×3 IMPLANT
BLADE SURG ROTATE 9660 (MISCELLANEOUS) IMPLANT
BUR EGG ELITE 4.0 (BURR) IMPLANT
BUR MATCHSTICK NEURO 3.0 LAGG (BURR) IMPLANT
CANISTER SUCTION 2500CC (MISCELLANEOUS) ×3 IMPLANT
CLOTH BEACON ORANGE TIMEOUT ST (SAFETY) IMPLANT
CLSR STERI-STRIP ANTIMIC 1/2X4 (GAUZE/BANDAGES/DRESSINGS) ×3 IMPLANT
CORDS BIPOLAR (ELECTRODE) ×6 IMPLANT
COVER SURGICAL LIGHT HANDLE (MISCELLANEOUS) ×9 IMPLANT
CRADLE DONUT ADULT HEAD (MISCELLANEOUS) ×3 IMPLANT
CUFF TOURNIQUET SINGLE 18IN (TOURNIQUET CUFF) IMPLANT
CUFF TOURNIQUET SINGLE 24IN (TOURNIQUET CUFF) ×3 IMPLANT
DRAPE C-ARM 42X72 X-RAY (DRAPES) ×3 IMPLANT
DRAPE INCISE IOBAN 66X45 STRL (DRAPES) ×3 IMPLANT
DRAPE POUCH INSTRU U-SHP 10X18 (DRAPES) ×3 IMPLANT
DRAPE SURG 17X23 STRL (DRAPES) ×6 IMPLANT
DRAPE U-SHAPE 47X51 STRL (DRAPES) ×3 IMPLANT
DRSG ADAPTIC 3X8 NADH LF (GAUZE/BANDAGES/DRESSINGS) ×3 IMPLANT
DRSG MEPILEX BORDER 4X4 (GAUZE/BANDAGES/DRESSINGS) ×3 IMPLANT
DURAPREP 26ML APPLICATOR (WOUND CARE) ×3 IMPLANT
ELECT COATED BLADE 2.86 ST (ELECTRODE) ×3 IMPLANT
ELECT REM PT RETURN 9FT ADLT (ELECTROSURGICAL) ×3
ELECTRODE REM PT RTRN 9FT ADLT (ELECTROSURGICAL) ×2 IMPLANT
EVACUATOR 1/8 PVC DRAIN (DRAIN) IMPLANT
GAUZE XEROFORM 1X8 LF (GAUZE/BANDAGES/DRESSINGS) ×3 IMPLANT
GAUZE XEROFORM 5X9 LF (GAUZE/BANDAGES/DRESSINGS) ×3 IMPLANT
GLOVE BIOGEL M STRL SZ7.5 (GLOVE) ×3 IMPLANT
GLOVE BIOGEL PI IND STRL 8 (GLOVE) ×2 IMPLANT
GLOVE BIOGEL PI IND STRL 8.5 (GLOVE) ×2 IMPLANT
GLOVE BIOGEL PI INDICATOR 8 (GLOVE) ×1
GLOVE BIOGEL PI INDICATOR 8.5 (GLOVE) ×1
GLOVE ECLIPSE 8.5 STRL (GLOVE) ×3 IMPLANT
GLOVE ORTHO TXT STRL SZ7.5 (GLOVE) ×3 IMPLANT
GLOVE SS BIOGEL STRL SZ 8 (GLOVE) ×2 IMPLANT
GLOVE SUPERSENSE BIOGEL SZ 8 (GLOVE) ×1
GOWN PREVENTION PLUS XLARGE (GOWN DISPOSABLE) ×3 IMPLANT
GOWN PREVENTION PLUS XXLARGE (GOWN DISPOSABLE) ×3 IMPLANT
GOWN STRL NON-REIN LRG LVL3 (GOWN DISPOSABLE) ×6 IMPLANT
GOWN STRL REIN 2XL XLG LVL4 (GOWN DISPOSABLE) ×3 IMPLANT
GOWN STRL REIN XL XLG (GOWN DISPOSABLE) ×9 IMPLANT
INTERLOCK LRDTC CRVCL VBR 8MM (Peek) ×4 IMPLANT
KIT BASIN OR (CUSTOM PROCEDURE TRAY) ×6 IMPLANT
KIT ROOM TURNOVER OR (KITS) ×6 IMPLANT
LOOP VESSEL MAXI BLUE (MISCELLANEOUS) IMPLANT
LORDOTIC CERVICAL VBR 8MM SM (Peek) ×6 IMPLANT
NEEDLE HYPO 25GX1X1/2 BEV (NEEDLE) IMPLANT
NEEDLE SPNL 18GX3.5 QUINCKE PK (NEEDLE) ×3 IMPLANT
NS IRRIG 1000ML POUR BTL (IV SOLUTION) ×6 IMPLANT
PACK ORTHO CERVICAL (CUSTOM PROCEDURE TRAY) ×3 IMPLANT
PACK ORTHO EXTREMITY (CUSTOM PROCEDURE TRAY) ×3 IMPLANT
PACK UNIVERSAL I (CUSTOM PROCEDURE TRAY) ×3 IMPLANT
PAD ARMBOARD 7.5X6 YLW CONV (MISCELLANEOUS) ×12 IMPLANT
PAD CAST 4YDX4 CTTN HI CHSV (CAST SUPPLIES) ×2 IMPLANT
PADDING CAST COTTON 4X4 STRL (CAST SUPPLIES) ×1
PADDING CAST COTTON 6X4 STRL (CAST SUPPLIES) ×3 IMPLANT
PATTIES SURGICAL .25X.25 (GAUZE/BANDAGES/DRESSINGS) ×3 IMPLANT
PIN DISTRACTION 14 (PIN) ×3 IMPLANT
PIN RETAINER PRODISC 14 MM (PIN) ×3 IMPLANT
PIN TEMP SKYLINE THREADED (PIN) ×3 IMPLANT
PLATE SKYLINE 2 LEVEL 34MM (Plate) ×3 IMPLANT
PUTTY BONE DBX 2.5 MIS (Bone Implant) ×3 IMPLANT
RESTRAINT LIMB HOLDER UNIV (RESTRAINTS) ×3 IMPLANT
SCREW SKYLINE 14MM SD-VA (Screw) ×12 IMPLANT
SCREW SKYLINE VAR OS 14MM (Screw) ×6 IMPLANT
SOLUTION BETADINE 4OZ (MISCELLANEOUS) ×3 IMPLANT
SPLINT FIBERGLASS 3X12 (CAST SUPPLIES) ×3 IMPLANT
SPONGE GAUZE 4X4 12PLY (GAUZE/BANDAGES/DRESSINGS) ×3 IMPLANT
SPONGE INTESTINAL PEANUT (DISPOSABLE) ×6 IMPLANT
SPONGE LAP 4X18 X RAY DECT (DISPOSABLE) ×6 IMPLANT
SPONGE SCRUB IODOPHOR (GAUZE/BANDAGES/DRESSINGS) ×3 IMPLANT
SPONGE SURGIFOAM ABS GEL 100 (HEMOSTASIS) ×3 IMPLANT
SUCTION FRAZIER TIP 10 FR DISP (SUCTIONS) IMPLANT
SURGIFLO TRUKIT (HEMOSTASIS) ×3 IMPLANT
SUT MON AB 3-0 SH 27 (SUTURE) ×1
SUT MON AB 3-0 SH27 (SUTURE) ×2 IMPLANT
SUT PROLENE 4 0 PS 2 18 (SUTURE) ×3 IMPLANT
SUT SILK 2 0 (SUTURE)
SUT SILK 2-0 18XBRD TIE 12 (SUTURE) IMPLANT
SUT VIC AB 2-0 CT1 18 (SUTURE) ×3 IMPLANT
SUT VIC AB 2-0 CT1 27 (SUTURE)
SUT VIC AB 2-0 CT1 TAPERPNT 27 (SUTURE) IMPLANT
SUT VIC AB 3-0 FS2 27 (SUTURE) IMPLANT
SYR BULB IRRIGATION 50ML (SYRINGE) ×3 IMPLANT
SYR CONTROL 10ML LL (SYRINGE) ×3 IMPLANT
SYSTEM CHEST DRAIN TLS 7FR (DRAIN) IMPLANT
TAPE CLOTH 4X10 WHT NS (GAUZE/BANDAGES/DRESSINGS) ×3 IMPLANT
TAPE UMBILICAL COTTON 1/8X30 (MISCELLANEOUS) ×3 IMPLANT
TOWEL OR 17X24 6PK STRL BLUE (TOWEL DISPOSABLE) ×6 IMPLANT
TOWEL OR 17X26 10 PK STRL BLUE (TOWEL DISPOSABLE) ×6 IMPLANT
TRAY FOLEY CATH 16FR SILVER (SET/KITS/TRAYS/PACK) ×3 IMPLANT
TRAY FOLEY CATH 16FRSI W/METER (SET/KITS/TRAYS/PACK) IMPLANT
TUBE CONNECTING 12X1/4 (SUCTIONS) IMPLANT
TUBE EVACUATION TLS (MISCELLANEOUS) IMPLANT
UNDERPAD 30X30 INCONTINENT (UNDERPADS AND DIAPERS) ×3 IMPLANT
WATER STERILE IRR 1000ML POUR (IV SOLUTION) ×3 IMPLANT

## 2013-07-08 NOTE — Anesthesia Preprocedure Evaluation (Addendum)
Anesthesia Evaluation  Patient identified by MRN, date of birth, ID band Patient awake    Reviewed: Allergy & Precautions, H&P , NPO status , Patient's Chart, lab work & pertinent test results  Airway Mallampati: II TM Distance: >3 FB Neck ROM: Limited    Dental  (+) Dental Advisory Given   Pulmonary  breath sounds clear to auscultation        Cardiovascular hypertension, Rhythm:Regular Rate:Normal     Neuro/Psych  Neuromuscular disease    GI/Hepatic   Endo/Other    Renal/GU      Musculoskeletal   Abdominal (+) + obese,   Peds  Hematology   Anesthesia Other Findings   Reproductive/Obstetrics                         Anesthesia Physical Anesthesia Plan  ASA: II  Anesthesia Plan: General   Post-op Pain Management:    Induction: Intravenous  Airway Management Planned: Oral ETT and Video Laryngoscope Planned  Additional Equipment:   Intra-op Plan:   Post-operative Plan: Extubation in OR  Informed Consent: I have reviewed the patients History and Physical, chart, labs and discussed the procedure including the risks, benefits and alternatives for the proposed anesthesia with the patient or authorized representative who has indicated his/her understanding and acceptance.     Plan Discussed with: CRNA and Surgeon  Anesthesia Plan Comments:        Anesthesia Quick Evaluation

## 2013-07-08 NOTE — Preoperative (Signed)
Beta Blockers   Reason not to administer Beta Blockers:Not Applicable 

## 2013-07-08 NOTE — H&P (Signed)
No change in clinical exam H+P reviewed  

## 2013-07-08 NOTE — Anesthesia Procedure Notes (Signed)
Procedure Name: Intubation Date/Time: 07/08/2013 2:45 PM Performed by: Elon Alas Pre-anesthesia Checklist: Patient identified, Timeout performed, Emergency Drugs available, Suction available and Patient being monitored Patient Re-evaluated:Patient Re-evaluated prior to inductionOxygen Delivery Method: Circle system utilized Preoxygenation: Pre-oxygenation with 100% oxygen Intubation Type: IV induction Ventilation: Mask ventilation without difficulty and Oral airway inserted - appropriate to patient size Laryngoscope Size: Mac, 4, Miller and 2 Grade View: Grade II Tube type: Oral Tube size: 7.5 mm Number of attempts: 1 Airway Equipment and Method: Stylet Placement Confirmation: positive ETCO2,  ETT inserted through vocal cords under direct vision and breath sounds checked- equal and bilateral Secured at: 23 cm Tube secured with: Tape Dental Injury: Teeth and Oropharynx as per pre-operative assessment  Comments: DLx1 with MAC 4 - unable to lift epiglottis, DLx1 with Miller 2 - grade 2 view by Dr. Krista Blue

## 2013-07-08 NOTE — Op Note (Signed)
See Dictation #[id number] Amanda Pea MD

## 2013-07-08 NOTE — Anesthesia Postprocedure Evaluation (Signed)
  Anesthesia Post-op Note  Patient: Shane Haney  Procedure(s) Performed: Procedure(s): ANTERIOR CERVICAL DECOMPRESSION/DISCECTOMY FUSION 2 LEVELS C3-C5 (N/A) LIMITED OPEN RIGHT CARPAL TUNNEL RELEASE (Right)  Patient Location: PACU  Anesthesia Type:General  Level of Consciousness: awake and alert   Airway and Oxygen Therapy: Patient Spontanous Breathing  Post-op Pain: mild  Post-op Assessment: Post-op Vital signs reviewed, Patient's Cardiovascular Status Stable, Respiratory Function Stable, Patent Airway, No signs of Nausea or vomiting and Pain level controlled  Post-op Vital Signs: Reviewed and stable  Complications: No apparent anesthesia complications

## 2013-07-08 NOTE — Transfer of Care (Signed)
Immediate Anesthesia Transfer of Care Note  Patient: Evaristo Tsuda [facility]  Procedure(s) Performed: Procedure(s): ANTERIOR CERVICAL DECOMPRESSION/DISCECTOMY FUSION 2 LEVELS C3-C5 (N/A) LIMITED OPEN RIGHT CARPAL TUNNEL RELEASE (Right)  Patient Location: PACU  Anesthesia Type:General  Level of Consciousness: oriented, sedated, patient cooperative and responds to stimulation  Airway & Oxygen Therapy: Patient Spontanous Breathing and Patient connected to nasal cannula oxygen  Post-op Assessment: Report given to PACU RN, Post -op Vital signs reviewed and stable, Patient moving all extremities and Patient moving all extremities X 4  Post vital signs: Reviewed and stable  Complications: No apparent anesthesia complications

## 2013-07-08 NOTE — Brief Op Note (Signed)
07/08/2013  5:47 PM  PATIENT:  Temple Pacini Martes  52 y.o. male  PRE-OPERATIVE DIAGNOSIS:  CERVICAL SPONDYLOTIC MYELOPATHY, RIGHT CARPAL TUNNEL SYNDROME  POST-OPERATIVE DIAGNOSIS:  CERVICAL SPONDYLOTIC MYELOPATHY, RIGHT CARPAL TUNNEL SYNDROME  PROCEDURE:  Procedure(s): ANTERIOR CERVICAL DECOMPRESSION/DISCECTOMY FUSION 2 LEVELS C3-C5 (N/A) LIMITED OPEN RIGHT CARPAL TUNNEL RELEASE (Right)  SURGEON:  Surgeon(s) and Role: Panel 1:    * Venita Lick, MD - Primary  Panel 2:    * Dominica Severin, MD - Primary  PHYSICIAN ASSISTANT:   ASSISTANTS: Lestine Mount   ANESTHESIA:   general  EBL:  Total I/O In: 1000 [I.V.:1000] Out: 175 [Urine:150; Blood:25]  BLOOD ADMINISTERED:none  DRAINS: none   LOCAL MEDICATIONS USED:  MARCAINE     SPECIMEN:  No Specimen  DISPOSITION OF SPECIMEN:  N/A  COUNTS:  YES  TOURNIQUET:    DICTATION: .Other Dictation: Dictation Number [id number]  PLAN OF CARE: Admit for overnight observation  PATIENT DISPOSITION:  PACU - hemodynamically stable.

## 2013-07-09 MED ORDER — POLYETHYLENE GLYCOL 3350 17 G PO PACK: 17.0000 g | PACK | Freq: Every day | ORAL | Status: DC

## 2013-07-09 MED ORDER — ONDANSETRON 4 MG PO TBDP: 4.0000 mg | ORAL_TABLET | Freq: Three times a day (TID) | ORAL | Status: DC | PRN

## 2013-07-09 MED ORDER — OXYCODONE-ACETAMINOPHEN 5-325 MG PO TABS: 1.0000 | ORAL_TABLET | Freq: Four times a day (QID) | ORAL | Status: DC | PRN

## 2013-07-09 MED ORDER — DOCUSATE SODIUM 100 MG PO CAPS: 100.0000 mg | ORAL_CAPSULE | Freq: Two times a day (BID) | ORAL | Status: DC

## 2013-07-09 MED ORDER — METHOCARBAMOL 500 MG PO TABS: 500.0000 mg | ORAL_TABLET | Freq: Four times a day (QID) | ORAL | Status: DC | PRN

## 2013-07-09 NOTE — Op Note (Signed)
NAME:  BALTAZAR, Shane Haney NO.:  [id number]  MEDICAL RECORD NO.:  [id number]  LOCATION:  5N02C                        FACILITY:  [facility]  PHYSICIAN:  Alvy Beal, MD    DATE OF BIRTH:  01-Jan-1961  DATE OF PROCEDURE:  07/08/2013 DATE OF DISCHARGE:                              OPERATIVE REPORT   PREOPERATIVE DIAGNOSIS:  Cervical spondylotic myelopathy.  POSTOPERATIVE DIAGNOSIS:  Cervical spondylotic myelopathy.  OPERATIVE PROCEDURE:  Anterior cervical diskectomy and fusion C3-4 and C4-5.  FIRST ASSISTANT:  __________  COMPLICATIONS:  None.  CONDITION:  Stable.  HISTORY:  This is a very pleasant gentleman who has had severe bilateral carpal tunnel syndrome as well as signs and symptoms of cervical spondylitic myelopathy.  MRI demonstrated cord compression at 3-4 and 4- 5.  As a result of the myelopathy and the MRI changes, we elected to proceed an ACDF at C4-5, C3-4.  All appropriate risks, benefits, and alternatives were discussed with the patient and consent was obtained.  OPERATIVE NOTE:  The patient was brought to the operating room, placed supine on the table.  After successful induction of general anesthesia and endotracheal intubation, TEDs, SCDs, and Foley were inserted. Rolled towel was placed between shoulder blades and the anterior cervical spine was prepped and draped in a standard fashion.  Time-out was taken to confirm patient, procedure, and all other pertinent important data.  Once this was done, a transverse incision was made centered over the C4 vertebral body.  Sharp dissection was carried down to the deep fascia.  Deep fascia was sharply incised.  I began dissecting sharply along the medial border of the sternocleidomastoid. I continued through the deep cervical fascia until I could identify the omohyoid muscle.  I continued to sweep the omohyoid and the trachea and esophagus to the right as I dissected through the remainder of  the prevertebral fascia to the anterior longitudinal ligament.  I identified the carotid sheath, protected with a finger, and then began sweeping using Kittner dissectors across to the contralateral side.  I then placed a handheld retractor to hold the esophagus to the right and then protected the carotid sheath with my finger and completely exposed the 3- 4 and 4-5 disk space.  A needle was placed in the 3-4 and 4-5 disk, and I took x-ray to confirm that I was at the appropriate level.  Once this was done, I used bipolar electrocautery to mobilize the longus colli muscles from the midbody of C3 to the midbody of C5.  Once I had done this, I placed a 55 mm length retractor blade underneath the longus colli muscle, deflated the endotracheal cuff, and expanded the retractors to the appropriate width.  I had a very good visualization of the 3-4 and 4-5 disk space.  I then incised the 3-4 disk space with a 15 blade scalpel, and performed a generous diskectomy.  I then removed the overhanging osteophyte from the body of C3 to completely expose the disk space.  Once I had done this, I placed distraction pins into the body of C3 and C4, distracted the intervertebral space and maintained the distraction with the distraction pins.  I then continued to work posteriorly.  I released the posterior anulus with a fine 2.0 nerve hook.  I then used a 1-mm Kerrison to resect the bone spur from the inferior aspects of the C3 and C4 vertebral bodies.  I then developed a plane underneath the posterior longitudinal ligament with a nerve hook, and released the posterior longitudinal ligament to confirm I was completely behind the body of C3.  I did take an x-ray with the nerve hook behind the body of C3 confirming adequate decompression.  There was fragment of disk material and bone spur that was removed.  Once this was done, I then rasped the endplates until I had bleeding subchondral bone and I placed an 8  small titanium spacer.  This was packed with DBX mix. I had excellent fit.  I then repositioned my retractors to expose the 4- 5 disk space.  The distraction pins were repositioned, and I performed an annulotomy at C4-5.  Using pituitary rongeurs, I resected all of the disk material in a similar fashion as I had done at L3-4.  Again, I released the posterior longitudinal ligament in the same fashion to completely decompress this area.  The central disk herniation that was seen causing cord compression was also excised.  At this point in time, the endplates were rasped and I placed the same size graft that I had done at 3-4.  At this point, both grafts were properly positioned and well fitting.  I removed the distraction pins and placed a 34 mm diffuse guide aligned plate.  I fixed it at C5 with a 14 mm rescue screws and 14 mm regular screws at C3.  I also could check to ensure the esophagus was not beneath the plate.  I then placed the 14 mm screws into the body of C4.  All screws had excellent purchase and were tightened down, and I final torqued according to the manufacturers standards.  I irrigated the wound copiously with normal saline, obtained hemostasis using bipolar electrocautery.  Once I had hemostasis, I again checked the second time to ensure that the trachea and esophagus were not caught beneath the plate.  Once I confirmed this by tracing the length of the plate, I returned the trachea and esophagus to midline and closed the platysma with interrupted 2-0 Vicryl sutures.  3-0 Monocryl was used on the skin.  Steri-Strips and dry dressing were applied.  An Aspen collar was applied.  At this point in time, my partner Dr. Amanda Pea entered into the OR field to perform a carpal tunnel on the right side.  This was agreed upon preoperatively.  Please refer to his dictation for specifics on the carpal tunnel release.     Alvy Beal, MD     DDB/MEDQ  D:  07/08/2013  T:   07/09/2013  Job:  [id number]

## 2013-07-09 NOTE — Progress Notes (Signed)
PT Cancellation Note  Patient Details Name: Shane Haney MRN: [id number] DOB: February 07, 1961   Cancelled Treatment:    Reason Eval/Treat Not Completed: PT screened, no needs identified, will sign off. Spoke with OT; per OT no acute PT needs at this time.    [name], [place] [phone] 07/09/2013, 9:24 AM

## 2013-07-09 NOTE — Progress Notes (Signed)
Subjective: Doing well.  No complaints.  Ready to go home.  Objective: Vital signs in last 24 hours: Temp:  [97.9 F (36.6 C)-98.5 F (36.9 C)] 98.2 F (36.8 C) (09/26 0518) Pulse Rate:  [63-86] 69 (09/26 0518) Resp:  [10-20] 15 (09/26 0518) BP: (120-161)/(71-92) 125/75 mmHg (09/26 0518) SpO2:  [93 %-98 %] 94 % (09/26 0518) Weight:  [120.203 kg (265 lb)] 120.203 kg (265 lb) (09/25 2101)  Intake/Output from previous day: 09/25 0701 - 09/26 0700 In: 2550 [I.V.:2550] Out: 850 [Urine:750; Blood:100] Intake/Output this shift:     Recent Labs  07/06/13 0855  HGB 16.1    Recent Labs  07/06/13 0855  WBC 6.4  RBC 4.89  HCT 43.7  PLT 255    Recent Labs  07/06/13 0855  NA 136  K 4.3  CL 101  CO2 24  BUN 14  CREATININE 0.91  GLUCOSE 100*  CALCIUM 9.5   No results found for this basename: LABPT, INR,  in the last 72 hours  Exam:  Neurologically intact.  Alert and oriented.  Dressing c/d/i.    Assessment/Plan: D/c home today after ambulates with therapy.  F/u 2 weeks postop.    Lizzet Hendley M 07/09/2013, 7:57 AM

## 2013-07-09 NOTE — Op Note (Signed)
NAMENYGEL, PROKOP NO.:  [id number]  MEDICAL RECORD NO.:  [id number]  LOCATION:  5N02C                        FACILITY:  [facility]  PHYSICIAN:  Dionne Ano. Kamarian Sahakian, M.D.DATE OF BIRTH:  [birth year]  DATE OF PROCEDURE: DATE OF DISCHARGE:                              OPERATIVE REPORT   PREOPERATIVE DIAGNOSIS:  Right carpal tunnel syndrome.  POSTOPERATIVE DIAGNOSIS:  Right carpal tunnel syndrome.  PROCEDURE:  Right open carpal tunnel release.  SURGEON:  Dionne Ano. Amanda Pea, M.D.  ASSISTANT:  None.  COMPLICATION:  None.  ANESTHESIA:  General.  TOURNIQUET TIME:  Less than 10 minutes.  INDICATIONS:  This is a pleasant 52 year old male with significant cervical myelopathy type symptoms as well as carpal tunnel syndrome based upon his diagnosis and electrodiagnostic exam.  I have counseled him regarding risks and benefits of surgery.  Dr. Venita Lick has performed an anterior cervical surgery with 2-level fusion previously (at today's setting).  The patient has had no gross intraoperative complications, and this will be dictated by Dr. Shon Baton.  DESCRIPTION OF PROCEDURE:  Following Dr. Shon Baton' surgery, I then prepped and draped the arm with Betadine scrub and paint.  Tourniquet was insufflated to 250 mmHg, and a 2-cm incision was made at the distal edge of the transverse carpal ligament coursing proximally, but not crossing the wrist crease.  Dissection was carried down.  Palmar fascia incised. The distal edge of the transverse carpal ligament was released under 4.0 loupe magnification.  Fat pad egressed nicely.  Superficial palmar arch was protected as was the median nerve.  Distal to proximal dissection was carried out until I was able to slide blunt-tip Jarit scissors, the remaining portions of the transverse carpal ligament and into the antebrachial fascia.  It was completely released, had a very tight compressed canal and there were no complicating features.   I irrigated copiously, secured hemostasis with bipolar electrocautery about bleeding points and then irrigated copiously and closed the wound.  The patient tolerated this well.  There were no complicating features.  He was dressed with Adaptic, Xeroform gauze, and a small fiberglass splint at the conclusion of the case.  He tolerated this well.  He will be monitored.  He will be admitted overnight for his cervical spine issues.  I have discussed these issues with the patient and his family.  I will see him back in 12 days.  These notes were discussed, and all questions have been addressed.     Dionne Ano. Amanda Pea, M.D.     [facility]  D:  07/08/2013  T:  07/09/2013  Job:  [id number]

## 2013-07-09 NOTE — Evaluation (Signed)
Occupational Therapy Evaluation and Discharge Patient Details Name: Shane Haney MRN: [id number] DOB: May 31, 1961 Today's Date: 07/09/2013 Time: 8657-8469 OT Time Calculation (min): 31 min  OT Assessment / Plan / Recommendation History of present illness Anterior cervical diskectomy and fusion C3-4 and C4-5 and Right open carpal tunnel release   Clinical Impression   This 52 yo male presents to acute OT with above. All education completed, acute OT will sign off.    OT Assessment   (follow up therapy per MD)    Follow Up Recommendations  No OT follow up       Equipment Recommendations  None recommended by OT          Precautions / Restrictions Precautions Precautions: Cervical Precaution Comments: handout given Required Braces or Orthoses: Cervical Brace Cervical Brace:  (when up and about per pt per MD) Restrictions Weight Bearing Restrictions: No       ADL  Transfers/Ambulation Related to ADLs: Independent/Mod I with all including stairs using LUE on rail going up and bil hands on rail to his right coming down a flight ADL Comments: Went over with pt how to adjust c-collar forward/backward and up/down; as well as how to change out pads; pt Mod I with all BADLs due to cervical collar        Visit Information  Last OT Received On: 07/09/13 Assistance Needed: +1 History of Present Illness: Anterior cervical diskectomy and fusion C3-4 and C4-5 and Right open carpal tunnel release       Prior Functioning     Home Living Family/patient expects to be discharged to:: Private residence Living Arrangements: Spouse/significant other Home Layout: Two level Alternate Level Stairs-Number of Steps: flight x2 Alternate Level Stairs-Rails: Left Home Equipment: Hand held shower head         Vision/Perception Vision - History Patient Visual Report: No change from baseline   Cognition  Cognition Arousal/Alertness: Awake/alert Behavior During Therapy: WFL for  tasks assessed/performed Overall Cognitive Status: Within Functional Limits for tasks assessed    Extremity/Trunk Assessment Upper Extremity Assessment Upper Extremity Assessment: RUE deficits/detail;LUE deficits/detail RUE Deficits / Details: Cast affecting proximal digits, wrist, and 2/3 way up forearm; rest WFL RUE Coordination: decreased fine motor;decreased gross motor LUE Sensation: decreased light touch (digits 1-3--PTA (s/p carpal tunnel release))     Mobility Bed Mobility Bed Mobility: Rolling Right;Right Sidelying to Sit Rolling Right: 6: Modified independent (Device/Increase time) Right Sidelying to Sit: 6: Modified independent (Device/Increase time);HOB flat Transfers Transfers: Sit to Stand;Stand to Sit Sit to Stand: 7: Independent;Without upper extremity assist;From bed Stand to Sit: 7: Independent;Without upper extremity assist;To bed           End of Session OT - End of Session Equipment Utilized During Treatment: Cervical collar Activity Tolerance: Patient tolerated treatment well Patient left:  (walking with wife around unit) Nurse Communication:  (Pt ready to go from a therapy standpoint)  GO Functional Assessment Tool Used: Clinical observation Functional Limitation: Self care Self Care Current Status (G2952): At least 1 percent but less than 20 percent impaired, limited or restricted Self Care Goal Status (W4132): At least 1 percent but less than 20 percent impaired, limited or restricted Self Care Discharge Status [phone]): At least 1 percent but less than 20 percent impaired, limited or restricted   Evette Georges [phone] 07/09/2013, 11:12 AM

## 2013-07-09 NOTE — Progress Notes (Signed)
Utilization review completed. Kalon Erhardt, RN, BSN. 

## 2013-07-09 NOTE — Discharge Summary (Signed)
  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  Progressively worsening symptoms.  Failed conservative treatment.     SIGNIFICANT FINDINGS:  Spondylosis, myelopathy  OPERATION:  C3-4 and C4-5 ACDF  FINAL DIAGNOSIS:  same  SECONDARY DIAGNOSIS: none  CONSULTANTS:  none  DISCHARGE CONDITION:  STABLE  DISCHARGED TO:  HOME

## 2013-07-13 ENCOUNTER — Encounter (HOSPITAL_COMMUNITY): Payer: Self-pay | Admitting: Orthopedic Surgery

## 2013-07-13 NOTE — Telephone Encounter (Signed)
Msg left to call the office     KP 

## 2013-07-20 NOTE — Telephone Encounter (Signed)
Letter mailed     KP 

## 2013-07-21 ENCOUNTER — Inpatient Hospital Stay (HOSPITAL_COMMUNITY)
Admission: AD | Admit: 2013-07-21 | Discharge: 2013-07-23 | DRG: 418 | Disposition: A | Discharge status: Home / Self Care | Payer: BC Managed Care – PPO | Source: Ambulatory Visit | Attending: Orthopedic Surgery | Admitting: Orthopedic Surgery

## 2013-07-21 ENCOUNTER — Encounter (HOSPITAL_COMMUNITY)
Admission: AD | Disposition: A | Discharge status: Home / Self Care | Payer: Self-pay | Source: Ambulatory Visit | Attending: Orthopedic Surgery

## 2013-07-21 ENCOUNTER — Encounter (HOSPITAL_COMMUNITY): Payer: Self-pay | Admitting: Anesthesiology

## 2013-07-21 ENCOUNTER — Inpatient Hospital Stay (HOSPITAL_COMMUNITY): Payer: BC Managed Care – PPO | Admitting: Anesthesiology

## 2013-07-21 ENCOUNTER — Encounter (HOSPITAL_COMMUNITY): Payer: BC Managed Care – PPO | Admitting: Anesthesiology

## 2013-07-21 DIAGNOSIS — IMO0001 Reserved for inherently not codable concepts without codable children: Secondary | ICD-10-CM | POA: Diagnosis present

## 2013-07-21 DIAGNOSIS — I10 Essential (primary) hypertension: Secondary | ICD-10-CM | POA: Diagnosis present

## 2013-07-21 DIAGNOSIS — E785 Hyperlipidemia, unspecified: Secondary | ICD-10-CM | POA: Diagnosis present

## 2013-07-21 DIAGNOSIS — B950 Streptococcus, group A, as the cause of diseases classified elsewhere: Secondary | ICD-10-CM | POA: Diagnosis present

## 2013-07-21 DIAGNOSIS — K5730 Diverticulosis of large intestine without perforation or abscess without bleeding: Secondary | ICD-10-CM | POA: Diagnosis present

## 2013-07-21 DIAGNOSIS — K648 Other hemorrhoids: Secondary | ICD-10-CM | POA: Diagnosis present

## 2013-07-21 DIAGNOSIS — T8140XA Infection following a procedure, unspecified, initial encounter: Principal | ICD-10-CM | POA: Diagnosis present

## 2013-07-21 DIAGNOSIS — M1990 Unspecified osteoarthritis, unspecified site: Secondary | ICD-10-CM | POA: Diagnosis present

## 2013-07-21 DIAGNOSIS — Y831 Surgical operation with implant of artificial internal device as the cause of abnormal reaction of the patient, or of later complication, without mention of misadventure at the time of the procedure: Secondary | ICD-10-CM | POA: Diagnosis present

## 2013-07-21 DIAGNOSIS — H538 Other visual disturbances: Secondary | ICD-10-CM | POA: Diagnosis present

## 2013-07-21 HISTORY — DX: Unspecified osteoarthritis, unspecified site: M19.90

## 2013-07-21 HISTORY — DX: Cardiac arrhythmia, unspecified: I49.9

## 2013-07-21 HISTORY — PX: INCISION AND DRAINAGE OF WOUND: SHX1803

## 2013-07-21 HISTORY — DX: Snoring: R06.83

## 2013-07-21 LAB — GRAM STAIN

## 2013-07-21 LAB — COMPREHENSIVE METABOLIC PANEL
ALT: 34 U/L (ref 0–53)
AST: 24 U/L (ref 0–37)
Albumin: 3.3 g/dL — ABNORMAL LOW (ref 3.5–5.2)
Alkaline Phosphatase: 83 U/L (ref 39–117)
BUN: 17 mg/dL (ref 6–23)
CO2: 21 mEq/L (ref 19–32)
Calcium: 8.5 mg/dL (ref 8.4–10.5)
Chloride: 93 mEq/L — ABNORMAL LOW (ref 96–112)
Creatinine, Ser: 1.33 mg/dL (ref 0.50–1.35)
GFR calc non Af Amer: 61 mL/min — ABNORMAL LOW (ref >90)
Sodium: 129 mEq/L — ABNORMAL LOW (ref 135–145)

## 2013-07-21 LAB — CBC WITH DIFFERENTIAL/PLATELET
Basophils Absolute: 0 10*3/uL (ref 0.0–0.1)
Basophils Relative: 0 % (ref 0–1)
Eosinophils Absolute: 0 10*3/uL (ref 0.0–0.7)
Eosinophils Relative: 0 % (ref 0–5)
HCT: 40.3 % (ref 39.0–52.0)
Hemoglobin: 14.7 g/dL (ref 13.0–17.0)
Lymphocytes Relative: 5 % — ABNORMAL LOW (ref 12–46)
Lymphs Abs: 1 10*3/uL (ref 0.7–4.0)
MCH: 31.5 pg (ref 26.0–34.0)
MCHC: 36.5 g/dL — ABNORMAL HIGH (ref 30.0–36.0)
Monocytes Absolute: 1.2 10*3/uL — ABNORMAL HIGH (ref 0.1–1.0)
Monocytes Relative: 6 % (ref 3–12)
Neutro Abs: 17.7 10*3/uL — ABNORMAL HIGH (ref 1.7–7.7)
Platelets: 275 10*3/uL (ref 150–400)
RDW: 11.7 % (ref 11.5–15.5)
WBC: 19.9 10*3/uL — ABNORMAL HIGH (ref 4.0–10.5)

## 2013-07-21 LAB — SURGICAL PCR SCREEN: Staphylococcus aureus: NEGATIVE

## 2013-07-21 SURGERY — IRRIGATION AND DEBRIDEMENT WOUND
Anesthesia: General | Site: Neck | Wound class: Dirty or Infected

## 2013-07-21 MED ORDER — MIDAZOLAM HCL 5 MG/5ML IJ SOLN
INTRAMUSCULAR | Status: DC | PRN
  Administered 2013-07-21: 2 mg via INTRAVENOUS

## 2013-07-21 MED ORDER — OXYCODONE HCL 5 MG PO TABS
10.0000 mg | ORAL_TABLET | ORAL | Status: DC | PRN
  Administered 2013-07-21: 10 mg via ORAL
  Filled 2013-07-21 (×2): qty 2

## 2013-07-21 MED ORDER — ACETAMINOPHEN 10 MG/ML IV SOLN
1000.0000 mg | Freq: Four times a day (QID) | INTRAVENOUS | Status: AC
  Administered 2013-07-22 (×3): 1000 mg via INTRAVENOUS
  Filled 2013-07-21 (×3): qty 100

## 2013-07-21 MED ORDER — ZOLPIDEM TARTRATE 5 MG PO TABS: 5.0000 mg | ORAL_TABLET | Freq: Every evening | ORAL | Status: DC | PRN

## 2013-07-21 MED ORDER — MORPHINE SULFATE 2 MG/ML IJ SOLN: 1.0000 mg | INTRAMUSCULAR | Status: DC | PRN

## 2013-07-21 MED ORDER — SUCCINYLCHOLINE CHLORIDE 20 MG/ML IJ SOLN
INTRAMUSCULAR | Status: DC | PRN
  Administered 2013-07-21: 120 mg via INTRAVENOUS

## 2013-07-21 MED ORDER — ONDANSETRON HCL 4 MG/2ML IJ SOLN: 4.0000 mg | INTRAMUSCULAR | Status: DC | PRN

## 2013-07-21 MED ORDER — CEFAZOLIN SODIUM 1-5 GM-% IV SOLN
1.0000 g | Freq: Three times a day (TID) | INTRAVENOUS | Status: DC
  Administered 2013-07-21 – 2013-07-23 (×5): 1 g via INTRAVENOUS
  Filled 2013-07-21 (×7): qty 50

## 2013-07-21 MED ORDER — METHOCARBAMOL 500 MG PO TABS
500.0000 mg | ORAL_TABLET | Freq: Four times a day (QID) | ORAL | Status: DC | PRN
  Administered 2013-07-22: 500 mg via ORAL
  Filled 2013-07-21: qty 1

## 2013-07-21 MED ORDER — ACETAMINOPHEN 10 MG/ML IV SOLN
INTRAVENOUS | Status: AC
  Filled 2013-07-21: qty 100

## 2013-07-21 MED ORDER — METHOCARBAMOL 100 MG/ML IJ SOLN
500.0000 mg | Freq: Four times a day (QID) | INTRAVENOUS | Status: DC | PRN
  Filled 2013-07-21: qty 5

## 2013-07-21 MED ORDER — PHENOL 1.4 % MT LIQD: 1.0000 | OROMUCOSAL | Status: DC | PRN

## 2013-07-21 MED ORDER — SODIUM CHLORIDE 0.9 % IJ SOLN: 3.0000 mL | INTRAMUSCULAR | Status: DC | PRN

## 2013-07-21 MED ORDER — CEFAZOLIN SODIUM-DEXTROSE 2-3 GM-% IV SOLR
INTRAVENOUS | Status: DC | PRN
  Administered 2013-07-21: 2 g via INTRAVENOUS

## 2013-07-21 MED ORDER — ACETAMINOPHEN 500 MG PO TABS
1000.0000 mg | ORAL_TABLET | Freq: Once | ORAL | Status: AC
  Administered 2013-07-21: 1000 mg via ORAL

## 2013-07-21 MED ORDER — LACTATED RINGERS IV SOLN
INTRAVENOUS | Status: DC
  Administered 2013-07-21 – 2013-07-22 (×2): via INTRAVENOUS

## 2013-07-21 MED ORDER — SODIUM CHLORIDE 0.9 % IJ SOLN: 3.0000 mL | Freq: Two times a day (BID) | INTRAMUSCULAR | Status: DC

## 2013-07-21 MED ORDER — OXYCODONE HCL 5 MG PO TABS: 5.0000 mg | ORAL_TABLET | Freq: Once | ORAL | Status: DC | PRN

## 2013-07-21 MED ORDER — LIDOCAINE HCL (CARDIAC) 20 MG/ML IV SOLN
INTRAVENOUS | Status: DC | PRN
  Administered 2013-07-21: 100 mg via INTRAVENOUS

## 2013-07-21 MED ORDER — VANCOMYCIN HCL 1000 MG IV SOLR
INTRAVENOUS | Status: DC | PRN
  Administered 2013-07-21: 1000 mg via TOPICAL

## 2013-07-21 MED ORDER — CEFAZOLIN SODIUM-DEXTROSE 2-3 GM-% IV SOLR
INTRAVENOUS | Status: AC
  Filled 2013-07-21: qty 50

## 2013-07-21 MED ORDER — VANCOMYCIN HCL 1000 MG IV SOLR
INTRAVENOUS | Status: AC
  Filled 2013-07-21: qty 1000

## 2013-07-21 MED ORDER — FENTANYL CITRATE 0.05 MG/ML IJ SOLN
INTRAMUSCULAR | Status: DC | PRN
  Administered 2013-07-21: 50 ug via INTRAVENOUS
  Administered 2013-07-21: 150 ug via INTRAVENOUS
  Administered 2013-07-21: 50 ug via INTRAVENOUS

## 2013-07-21 MED ORDER — HYDROMORPHONE HCL PF 1 MG/ML IJ SOLN: 0.2500 mg | INTRAMUSCULAR | Status: DC | PRN

## 2013-07-21 MED ORDER — PHENYLEPHRINE HCL 10 MG/ML IJ SOLN
INTRAMUSCULAR | Status: DC | PRN
  Administered 2013-07-21 (×2): 40 ug via INTRAVENOUS

## 2013-07-21 MED ORDER — METOCLOPRAMIDE HCL 5 MG/ML IJ SOLN: 10.0000 mg | Freq: Once | INTRAMUSCULAR | Status: DC | PRN

## 2013-07-21 MED ORDER — ONDANSETRON HCL 4 MG/2ML IJ SOLN
INTRAMUSCULAR | Status: DC | PRN
  Administered 2013-07-21: 4 mg via INTRAMUSCULAR

## 2013-07-21 MED ORDER — MENTHOL 3 MG MT LOZG
1.0000 | LOZENGE | OROMUCOSAL | Status: DC | PRN
  Administered 2013-07-22 – 2013-07-23 (×3): 3 mg via ORAL
  Filled 2013-07-21 (×3): qty 9

## 2013-07-21 MED ORDER — LISINOPRIL 20 MG PO TABS
20.0000 mg | ORAL_TABLET | Freq: Every day | ORAL | Status: DC
  Administered 2013-07-22 – 2013-07-23 (×2): 20 mg via ORAL
  Filled 2013-07-21 (×3): qty 1

## 2013-07-21 MED ORDER — LACTATED RINGERS IV SOLN
INTRAVENOUS | Status: DC | PRN
  Administered 2013-07-21 (×2): via INTRAVENOUS

## 2013-07-21 MED ORDER — SODIUM CHLORIDE 0.9 % IV SOLN
250.0000 mL | INTRAVENOUS | Status: DC
  Administered 2013-07-21: 250 mL via INTRAVENOUS

## 2013-07-21 MED ORDER — 0.9 % SODIUM CHLORIDE (POUR BTL) OPTIME
TOPICAL | Status: DC | PRN
  Administered 2013-07-21 (×3): 1000 mL

## 2013-07-21 MED ORDER — PROPOFOL 10 MG/ML IV BOLUS
INTRAVENOUS | Status: DC | PRN
  Administered 2013-07-21: 200 mg via INTRAVENOUS

## 2013-07-21 MED ORDER — OXYCODONE HCL 5 MG/5ML PO SOLN: 5.0000 mg | Freq: Once | ORAL | Status: DC | PRN

## 2013-07-21 SURGICAL SUPPLY — 58 items
BUR EGG ELITE 4.0 (BURR) IMPLANT
CANISTER SUCTION 2500CC (MISCELLANEOUS) ×2 IMPLANT
CLOTH BEACON ORANGE TIMEOUT ST (SAFETY) ×2 IMPLANT
CLSR STERI-STRIP ANTIMIC 1/2X4 (GAUZE/BANDAGES/DRESSINGS) ×2 IMPLANT
CORDS BIPOLAR (ELECTRODE) ×2 IMPLANT
COVER SURGICAL LIGHT HANDLE (MISCELLANEOUS) ×2 IMPLANT
DRAIN CHANNEL 10M FLAT 3/4 FLT (DRAIN) IMPLANT
DRAIN TLS ROUND 10FR (DRAIN) ×2 IMPLANT
DRAPE POUCH INSTRU U-SHP 10X18 (DRAPES) IMPLANT
DRAPE PROXIMA HALF (DRAPES) ×2 IMPLANT
DRAPE SURG 17X23 STRL (DRAPES) ×2 IMPLANT
DRAPE U-SHAPE 47X51 STRL (DRAPES) ×2 IMPLANT
DRSG MEPILEX BORDER 4X4 (GAUZE/BANDAGES/DRESSINGS) ×4 IMPLANT
DRSG MEPILEX BORDER 4X8 (GAUZE/BANDAGES/DRESSINGS) IMPLANT
DURAPREP 26ML APPLICATOR (WOUND CARE) ×2 IMPLANT
ELECT BLADE 4.0 EZ CLEAN MEGAD (MISCELLANEOUS)
ELECT CAUTERY BLADE 6.4 (BLADE) ×2 IMPLANT
ELECT REM PT RETURN 9FT ADLT (ELECTROSURGICAL) ×2
ELECTRODE BLDE 4.0 EZ CLN MEGD (MISCELLANEOUS) IMPLANT
ELECTRODE REM PT RTRN 9FT ADLT (ELECTROSURGICAL) ×1 IMPLANT
EVACUATOR 1/8 PVC DRAIN (DRAIN) IMPLANT
GLOVE BIOGEL PI IND STRL 8 (GLOVE) ×2 IMPLANT
GLOVE BIOGEL PI IND STRL 8.5 (GLOVE) ×1 IMPLANT
GLOVE BIOGEL PI INDICATOR 8 (GLOVE) ×2
GLOVE BIOGEL PI INDICATOR 8.5 (GLOVE) ×1
GLOVE ECLIPSE 8.5 STRL (GLOVE) ×2 IMPLANT
GLOVE ORTHO TXT STRL SZ7.5 (GLOVE) ×4 IMPLANT
GOWN PREVENTION PLUS XXLARGE (GOWN DISPOSABLE) ×2 IMPLANT
GOWN STRL NON-REIN LRG LVL3 (GOWN DISPOSABLE) ×2 IMPLANT
GOWN STRL REIN 2XL XLG LVL4 (GOWN DISPOSABLE) ×4 IMPLANT
KIT BASIN OR (CUSTOM PROCEDURE TRAY) ×2 IMPLANT
KIT ROOM TURNOVER OR (KITS) ×2 IMPLANT
NEEDLE 22X1 1/2 (OR ONLY) (NEEDLE) IMPLANT
NEEDLE SPNL 18GX3.5 QUINCKE PK (NEEDLE) IMPLANT
NS IRRIG 1000ML POUR BTL (IV SOLUTION) ×2 IMPLANT
PACK LAMINECTOMY ORTHO (CUSTOM PROCEDURE TRAY) ×2 IMPLANT
PACK UNIVERSAL I (CUSTOM PROCEDURE TRAY) ×2 IMPLANT
PAD ARMBOARD 7.5X6 YLW CONV (MISCELLANEOUS) ×4 IMPLANT
PATTIES SURGICAL .5 X.5 (GAUZE/BANDAGES/DRESSINGS) IMPLANT
PATTIES SURGICAL .5 X1 (DISPOSABLE) IMPLANT
SPONGE INTESTINAL PEANUT (DISPOSABLE) ×6 IMPLANT
SPONGE SURGIFOAM ABS GEL 100 (HEMOSTASIS) IMPLANT
STRIP CLOSURE SKIN 1/2X4 (GAUZE/BANDAGES/DRESSINGS) ×2 IMPLANT
SURGIFLO TRUKIT (HEMOSTASIS) IMPLANT
SUT MON AB 3-0 SH 27 (SUTURE)
SUT MON AB 3-0 SH27 (SUTURE) IMPLANT
SUT PROLENE 2 0 SH 30 (SUTURE) ×2 IMPLANT
SUT VIC AB 0 CT1 27 (SUTURE)
SUT VIC AB 0 CT1 27XBRD ANBCTR (SUTURE) IMPLANT
SUT VIC AB 1 CTX 36 (SUTURE)
SUT VIC AB 1 CTX36XBRD ANBCTR (SUTURE) IMPLANT
SUT VIC AB 2-0 CT1 18 (SUTURE) ×2 IMPLANT
SYR BULB IRRIGATION 50ML (SYRINGE) ×4 IMPLANT
SYR CONTROL 10ML LL (SYRINGE) ×2 IMPLANT
TOWEL OR 17X24 6PK STRL BLUE (TOWEL DISPOSABLE) ×2 IMPLANT
TOWEL OR 17X26 10 PK STRL BLUE (TOWEL DISPOSABLE) ×2 IMPLANT
WATER STERILE IRR 1000ML POUR (IV SOLUTION) ×2 IMPLANT
YANKAUER SUCT BULB TIP NO VENT (SUCTIONS) ×2 IMPLANT

## 2013-07-21 NOTE — Progress Notes (Signed)
Patient ambulated to the bathroom with 1 assist and he voided - unmeasured.

## 2013-07-21 NOTE — OR Nursing (Signed)
Pt's possessions collected from 6N and given to pt, checked that everything accounted for.

## 2013-07-21 NOTE — Brief Op Note (Signed)
07/21/2013  6:20 PM  PATIENT:  Temple Pacini Mode  52 y.o. male  PRE-OPERATIVE DIAGNOSIS:  Infected Cervical Wound  POST-OPERATIVE DIAGNOSIS:  Infected Cervical Wound  PROCEDURE:  Procedure(s): IRRIGATION AND DEBRIDEMENT OF CERVICAL WOUND (N/A)  SURGEON:  Surgeon(s) and Role:    * Venita Lick, MD - Primary  PHYSICIAN ASSISTANT:   ASSISTANTS: Zonia Kief   ANESTHESIA:   general  EBL:  Total I/O In: 1000 [I.V.:1000] Out: -   BLOOD ADMINISTERED:none  DRAINS: Penrose drain in the neck   LOCAL MEDICATIONS USED:  NONE  SPECIMEN:  Source of Specimen:  neck wound  DISPOSITION OF SPECIMEN:  Micro/Lab  COUNTS:  YES  TOURNIQUET:  * No tourniquets in log *  DICTATION: .Other Dictation: Dictation Number [id number]  PLAN OF CARE: Admit to inpatient   PATIENT DISPOSITION:  PACU - hemodynamically stable.

## 2013-07-21 NOTE — Preoperative (Signed)
Beta Blockers   Reason not to administer Beta Blockers:Not Applicable 

## 2013-07-21 NOTE — H&P (Signed)
Patient 2 weeks s/p ACDF Presented to office with swelling and tenderness at surgical wound States that the swelling started this AM I was able to express purulent drainage from the wound As a result - decided to re-admit and return to OR for formal I+D of the neck All risks benefits reviewed Full note pending

## 2013-07-21 NOTE — Anesthesia Postprocedure Evaluation (Signed)
  Anesthesia Post-op Note  Patient: Shane Haney  Procedure(s) Performed: Procedure(s): IRRIGATION AND DEBRIDEMENT OF CERVICAL WOUND (N/A)  Patient Location: PACU  Anesthesia Type:General  Level of Consciousness: awake, alert  and oriented  Airway and Oxygen Therapy: Patient Spontanous Breathing  Post-op Pain: none  Post-op Assessment: Post-op Vital signs reviewed, Patient's Cardiovascular Status Stable, Respiratory Function Stable, Patent Airway and Adequate PO intake  Post-op Vital Signs: stable  Complications: No apparent anesthesia complications

## 2013-07-21 NOTE — OR Nursing (Signed)
Report from gram stain micro called to Dr. Shon Baton.

## 2013-07-21 NOTE — Anesthesia Preprocedure Evaluation (Addendum)
Anesthesia Evaluation  Patient identified by MRN, date of birth, ID band Patient awake    Reviewed: Allergy & Precautions, H&P , NPO status , Patient's Chart, lab work & pertinent test results, reviewed documented beta blocker date and time   Airway Mallampati: II TM Distance: >3 FB Neck ROM: full    Dental  (+) Teeth Intact, Dental Advisory Given and Chipped,    Pulmonary neg pulmonary ROS,  breath sounds clear to auscultation        Cardiovascular hypertension, On Medications negative cardio ROS  Rhythm:regular     Neuro/Psych  Neuromuscular disease negative psych ROS   GI/Hepatic negative GI ROS, Neg liver ROS,   Endo/Other  Morbid obesity  Renal/GU negative Renal ROS  negative genitourinary   Musculoskeletal   Abdominal (+)  Abdomen: soft. Bowel sounds: normal.  Peds  Hematology negative hematology ROS (+)   Anesthesia Other Findings See surgeon's H&P   Reproductive/Obstetrics negative OB ROS                        Anesthesia Physical Anesthesia Plan  ASA: III and emergent  Anesthesia Plan: General   Post-op Pain Management:    Induction: Intravenous  Airway Management Planned: Oral ETT  Additional Equipment:   Intra-op Plan:   Post-operative Plan: Extubation in OR  Informed Consent: I have reviewed the patients History and Physical, chart, labs and discussed the procedure including the risks, benefits and alternatives for the proposed anesthesia with the patient or authorized representative who has indicated his/her understanding and acceptance.   Dental Advisory Given and Dental advisory given  Plan Discussed with: CRNA and Surgeon  Anesthesia Plan Comments:       Anesthesia Quick Evaluation

## 2013-07-21 NOTE — Transfer of Care (Signed)
Immediate Anesthesia Transfer of Care Note  Patient: Shane Haney  Procedure(s) Performed: Procedure(s): IRRIGATION AND DEBRIDEMENT OF CERVICAL WOUND (N/A)  Patient Location: PACU  Anesthesia Type:General  Level of Consciousness: awake, alert  and oriented  Airway & Oxygen Therapy: Patient connected to face mask oxygen  Post-op Assessment: Report given to PACU RN  Post vital signs: stable  Complications: No apparent anesthesia complications

## 2013-07-21 NOTE — Anesthesia Procedure Notes (Signed)
Procedure Name: Intubation Date/Time: 07/21/2013 5:29 PM Performed by: Ellin Goodie Pre-anesthesia Checklist: Patient identified, Emergency Drugs available, Suction available, Patient being monitored and Timeout performed Patient Re-evaluated:Patient Re-evaluated prior to inductionOxygen Delivery Method: Circle system utilized Preoxygenation: Pre-oxygenation with 100% oxygen Intubation Type: IV induction Ventilation: Mask ventilation without difficulty Laryngoscope Size: Mac and 3 Grade View: Grade I Tube type: Oral Tube size: 7.5 mm Number of attempts: 1 Airway Equipment and Method: Stylet and Video-laryngoscopy Placement Confirmation: ETT inserted through vocal cords under direct vision,  positive ETCO2 and breath sounds checked- equal and bilateral Secured at: 23 cm Tube secured with: Tape Dental Injury: Teeth and Oropharynx as per pre-operative assessment  Difficulty Due To: Difficulty was anticipated, Difficult Airway- due to reduced neck mobility and Difficult Airway- due to limited oral opening Comments: Atraumatic induction and intubation with planned video laryngoscope. Good view of cords, ETT passed easily.  Dr. Gelene Mink verified placement of ETT.  Carlynn Herald, CRNA

## 2013-07-22 ENCOUNTER — Inpatient Hospital Stay (HOSPITAL_COMMUNITY): Payer: BC Managed Care – PPO

## 2013-07-22 DIAGNOSIS — T8140XA Infection following a procedure, unspecified, initial encounter: Principal | ICD-10-CM

## 2013-07-22 LAB — CBC WITH DIFFERENTIAL/PLATELET
Basophils Relative: 0 % (ref 0–1)
Eosinophils Absolute: 0.3 10*3/uL (ref 0.0–0.7)
HCT: 38.8 % — ABNORMAL LOW (ref 39.0–52.0)
Hemoglobin: 14.1 g/dL (ref 13.0–17.0)
Lymphocytes Relative: 7 % — ABNORMAL LOW (ref 12–46)
Lymphs Abs: 0.9 10*3/uL (ref 0.7–4.0)
MCH: 31.7 pg (ref 26.0–34.0)
MCHC: 36.3 g/dL — ABNORMAL HIGH (ref 30.0–36.0)
Monocytes Absolute: 0.7 10*3/uL (ref 0.1–1.0)
Monocytes Relative: 6 % (ref 3–12)
Neutrophils Relative %: 85 % — ABNORMAL HIGH (ref 43–77)
Platelets: 241 10*3/uL (ref 150–400)
RBC: 4.45 MIL/uL (ref 4.22–5.81)

## 2013-07-22 LAB — PROTIME-INR
INR: 1.12 (ref 0.00–1.49)
Prothrombin Time: 14.2 seconds (ref 11.6–15.2)

## 2013-07-22 MED ORDER — SODIUM CHLORIDE 0.9 % IJ SOLN
10.0000 mL | INTRAMUSCULAR | Status: DC | PRN
  Administered 2013-07-23: 10 mL

## 2013-07-22 MED ORDER — DOCUSATE SODIUM 100 MG PO CAPS
100.0000 mg | ORAL_CAPSULE | Freq: Two times a day (BID) | ORAL | Status: DC
  Administered 2013-07-22 – 2013-07-23 (×3): 100 mg via ORAL
  Filled 2013-07-22 (×4): qty 1

## 2013-07-22 MED ORDER — POLYETHYLENE GLYCOL 3350 17 G PO PACK
17.0000 g | PACK | Freq: Every day | ORAL | Status: DC
  Administered 2013-07-22 – 2013-07-23 (×2): 17 g via ORAL
  Filled 2013-07-22 (×3): qty 1

## 2013-07-22 MED ORDER — OXYCODONE-ACETAMINOPHEN 5-325 MG PO TABS
1.0000 | ORAL_TABLET | ORAL | Status: DC | PRN
  Administered 2013-07-22: 1 via ORAL
  Filled 2013-07-22: qty 2

## 2013-07-22 NOTE — Progress Notes (Signed)
Orthopedic Tech Progress Note Patient Details:  [name] [facility] 1960-11-01 [id number]  Patient ID: Shane Haney, male   DOB: May 28, 1961, 52 y.o.   MRN: [id number]   [name] 07/22/2013, 8:23 AMCalled bio-tech for aspen collar.

## 2013-07-22 NOTE — H&P (Signed)
[facility]     Reason for Consult:superficial wound infection    Referring Physician: Dr. Shon Baton  Active Problems:   * No active hospital problems. *   .  ceFAZolin (ANCEF) IV  1 g Intravenous Q8H  . docusate sodium  100 mg Oral BID  . lisinopril  20 mg Oral QAC breakfast  . polyethylene glycol  17 g Oral Daily  . sodium chloride  3 mL Intravenous Q12H    Recommendations: Continue with cefazolin for 4 weeks through November 5th picc in place Will need weekly cbc, cmp with home health and antibiotics per home health protocol We will arrange follow up in [facility] in about 2 weeks  Thanks for consult, I will monitor culture and make changes as needed  Assessment: He has a wound infection that did not reach the level of deep tissue or hardware.     Antibiotics: Cefazolin day 2  HPI: Shane Haney is a 52 y.o. 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 Shon Baton with headache, sore throat, fever.  He was seen and taken to OR last night for debridement.  In the OR, purulence was noted, nothing in deep tissue or any concerns with plate.  Cultures are negative but one gram stain with GPC in pairs noted.  No history of MRSA .    Review of Systems: A comprehensive review of systems was negative.  Past Medical History  Diagnosis Date  . Hyperlipidemia   . H/O echocardiogram 2010    seen by Dr. Jens Som- told to return as needed   . Hypertension     Dr, Jens Som told pt. in 2010, ? extra beats.  . Neuromuscular disorder     spinal cord compression, carpal tunnel - R hand    . Dysrhythmia     "irregular at one time" (07/21/2013)  . Snores   . Osteoarthritis     "knees and wrists" (07/21/2013)    History  Substance Use Topics  . Smoking status: Never Smoker   . Smokeless tobacco: Never Used  . Alcohol Use: 2.4 oz/week    2 Cans of beer, 2 Shots of liquor per week     Comment: 07/21/2013 "3-5  mixed drinks or 6 beers on the weekends    Family History  Problem Relation Age of Onset  . Stroke Father   . Hypertension Father   . Alcohol abuse Father   . Hypertension Mother   . Aneurysm Mother     Brain  . HIV Brother     passed away from AIDS   No Known Allergies  OBJECTIVE: Blood pressure 112/66, pulse 77, temperature 98.6 F (37 C), temperature source Oral, resp. rate 18, height 5\' 10"  (1.778 m), weight 265 lb (120.203 kg), SpO2 98.00%. General: Awake, alert, nad Skin: no rashes Lungs: CTA B Cor: tachy rr  Abdomen: soft, nt, nd, +bs HEENT: + neck brace  Microbiology: Recent Results (from the past 240 hour(s))  SURGICAL PCR SCREEN     Status: None   Collection Time    07/21/13  4:40 PM      Result Value Range Status   MRSA, PCR NEGATIVE  NEGATIVE Final   Staphylococcus aureus NEGATIVE  NEGATIVE Final   Comment:            The Xpert SA Assay (FDA     approved for NASAL specimens     in patients over 21  years of age),     is one component of     a comprehensive surveillance     program.  Test performance has     been validated by [employer] for patients greater     than or equal to 51 year old.     It is not intended     to diagnose infection nor to     guide or monitor treatment.  ANAEROBIC CULTURE     Status: None   Collection Time    07/21/13  5:43 PM      Result Value Range Status   Specimen Description WOUND LEFT NECK   Final   Special Requests CERVICAL WOUND CULTURES   Final   Gram Stain     Final   Value: ABUNDANT WBC PRESENT,BOTH PMN AND MONONUCLEAR     NO SQUAMOUS EPITHELIAL CELLS SEEN     FEW GRAM POSITIVE COCCI     IN PAIRS Performed at [facility]     Performed at [facility]   Culture     Final   Value: NO ANAEROBES ISOLATED; CULTURE IN PROGRESS FOR 5 DAYS     Performed at [facility]   Report Status PENDING   Incomplete  GRAM STAIN     Status: None   Collection Time    07/21/13  5:43 PM      Result  Value Range Status   Specimen Description WOUND LEFT NECK   Final   Special Requests CERVICAL WOUND CULTURES   Final   Gram Stain     Final   Value: ABUNDANT WBC PRESENT,BOTH PMN AND MONONUCLEAR     FEW GRAM POSITIVE COCCI IN PAIRS     Gram Stain Report Called to,Read Back By and Verified With: D REES RN 1850 07/21/13 A BROWNING   Report Status 07/21/2013 FINAL   Final  WOUND CULTURE     Status: None   Collection Time    07/21/13  5:43 PM      Result Value Range Status   Specimen Description WOUND LEFT NECK   Final   Special Requests CERVICAL WOUND CULTURES   Final   Gram Stain     Final   Value: ABUNDANT WBC PRESENT,BOTH PMN AND MONONUCLEAR     NO SQUAMOUS EPITHELIAL CELLS SEEN     FEW GRAM POSITIVE COCCI     IN PAIRS Gram Stain Report Called to,Read Back By and Verified With: Gram Stain Report Called to,Read Back By and Verified With: D REES RN 07/21/13 BY A BROWNING Performed at [facility]     Performed at [facility]   Culture     Final   Value: Culture reincubated for better growth     Performed at [facility]   Report Status PENDING   Incomplete  ANAEROBIC CULTURE     Status: None   Collection Time    07/21/13  5:52 PM      Result Value Range Status   Specimen Description WOUND LEFT NECK   Final   Special Requests CERVICAL WOUND DEEP CULTURES   Final   Gram Stain     Final   Value: RARE WBC PRESENT,BOTH PMN AND MONONUCLEAR     NO ORGANISMS SEEN     Gram Stain Report Called to,Read Back By and Verified With: Gram Stain Report Called to,Read Back By and Verified With: D REES RN 1850 07/21/13 A BROWNING Performed at  [facility]     Performed at [facility]   Culture     Final   Value: NO ANAEROBES ISOLATED; CULTURE IN PROGRESS FOR 5 DAYS     Performed at [facility]   Report Status PENDING   Incomplete  GRAM STAIN     Status: None   Collection Time    07/21/13  5:52 PM      Result Value Range Status   Specimen Description  WOUND LEFT NECK   Final   Special Requests CERVICAL WOUND DEEP CULTURES   Final   Gram Stain     Final   Value: RARE WBC PRESENT,BOTH PMN AND MONONUCLEAR     NO ORGANISMS SEEN     Gram Stain Report Called to,Read Back By and Verified With: D REES RN 1850 07/21/13 A BROWNING   Report Status 07/21/2013 FINAL   Final  WOUND CULTURE     Status: None   Collection Time    07/21/13  5:52 PM      Result Value Range Status   Specimen Description WOUND LEFT NECK   Final   Special Requests CERVICAL WOUND DEEP CULTURES   Final   Gram Stain     Final   Value: RARE WBC PRESENT,BOTH PMN AND MONONUCLEAR     NO ORGANISMS SEEN     Gram Stain Report Called to,Read Back By and Verified With: Gram Stain Report Called to,Read Back By and Verified With: D REES RN 1850 07/21/13 A BROWNING Performed at [facility]     Performed at [facility]   Culture     Final   Value: NO GROWTH 1 DAY     Performed at [facility]   Report Status PENDING   Incomplete    Lewis Keats, Molly Maduro, MD [facility] [email or web address] [phone] pager  [phone] cell 07/22/2013, 1:25 PM

## 2013-07-22 NOTE — H&P (Signed)
Shane Haney is an 52 y.o. male.  Plan: Continue cefazolin   Assessment: Post-op irrigation and drainage of cervical wound day 81   HPI  52 year old male, was doing well until a day ago when he started experiencing a sore throat and headache at night. The headache was severe in intensity, interrupted his sleep, was bilateral and frontal and was pressure like--wasn't associated with blurry vision, hearing loss or tinnitis. He also complained of fever, chills and sweats.. At the same time, he developed swelling and tenderness but no effusion over the incision site on his anterior neck.  A couple of weeks ago he underwent ACDF for the cervical spondylotic myleopathy. Purulent effusion was found and he underwent irrigation and debridement of his cervical wound yesterday. He has been doing okay since the procedure other than a fever of 101 last night.   Social history: no smoking history but alcohol intake Family history: negative Allergies: none PMH: HYPERLIPIDEMIA-MIXED  HYPERTENSION, BENIGN ESSENTIAL  HEMORRHOIDS, INTERNAL  DIVERTICULOSIS, COLON  DEGENERATIVE JOINT DISEASE  PARESTHESIA, HANDS  Left knee pain  Obesity (BMI 30-39.9)  Spinal stenosis in cervical region  CTS (carpal tunnel syndrome)  Myalgia and myositis  Past surgeries: ACDF I & D of cervical wound ACL reconstruction 30 years  Hospital Medications Outpatient Medications   0.9 % sodium chloride infusion  acetaminophen (OFIRMEV) IV 1,000 mg  ceFAZolin (ANCEF) IVPB 1 g/50 mL premix  docusate sodium (COLACE) capsule 100 mg  lactated ringers infusion  lisinopril (PRINIVIL,ZESTRIL) tablet 20 mg  menthol-cetylpyridinium (CEPACOL) lozenge 3 mg  methocarbamol (ROBAXIN) 500 mg in dextrose 5 % 50 mL IVPB  methocarbamol (ROBAXIN) tablet 500 mg  morphine 2 MG/ML injection 1-4 mg  ondansetron (ZOFRAN) injection 4 mg  oxyCODONE (Oxy IR/ROXICODONE) immediate release tablet 10 mg  phenol (CHLORASEPTIC) mouth spray 1 spray   polyethylene glycol (MIRALAX / GLYCOLAX) packet 17 g  sodium chloride 0.9 % injection 10-40 mL  sodium chloride 0.9 % injection 3 mL  sodium chloride 0.9 % injection 3 mL  zolpidem (AMBIEN) tablet 5 mg  Outpatient meds: Lisinopril   Review of Systems  Constitutional: Positive for fever, chills and diaphoresis.  Respiratory: Negative for sputum production.   Cardiovascular: Negative for chest pain.  Neurological: Positive for tingling and sensory change.    Blood pressure 112/66, pulse 77, temperature 99.4 F (37.4 C), temperature source Oral, resp. rate 18, height 5\' 10"  (1.778 m), weight 120.203 kg (265 lb), SpO2 98.00%. Physical Exam  Constitutional: He is oriented to person, place, and time. He appears well-developed and well-nourished.  Cardiovascular: Normal rate, regular rhythm and normal heart sounds.   Respiratory: Effort normal and breath sounds normal.  GI: Soft. Bowel sounds are normal.  Neurological: He is alert and oriented to person, place, and time.     Intake/Output Report       10/07 0700 10/08 0659 10/08 0700 10/09 0659 10/09 0700 10/10 0659    I.V. (mL/kg)  1000 (8.3)     Total Intake(mL/kg)  1000 (8.3)     Net  +1000            Urine Occurrence  4 x               Selected Labs (Up to last 3 results from past 72 hours) Report       10/08 1931   10/09 0728      WBC 19.9        RBC 4.66  Hemoglobin 14.7       HCT 40.3       Platelets 275       Sodium 129        Potassium 4.1       Chloride 93        CO2 21       BUN 17       Creatinine 1.33       Calcium 8.5       INR   1.12      Anaerobic Cs, wound culture  Gram Stain - Result: ABUNDANT WBC PRESENT,BOTH PMN AND MONONUCLEAR NO SQUAMOUS EPITHELIAL CELLS SEEN FEW GRAM POSITIVE COCCI IN PAIRS Performed at [facility] Performed at [facility] Culture - Result: NO ANAEROBES ISOLATED; CULTURE IN PROGRESS FOR 5 DAYS Performed at [employer], [place] 07/22/2013, 11:43 AM

## 2013-07-22 NOTE — Progress Notes (Signed)
Chaplain provided ministry of presence to patient and offered emotional support. Patient said he is concerned for his children. Chaplain prayed with the patient. Chaplain will follow up as needed and requested.   07/22/13 1500  Clinical Encounter Type  Visited With Patient  Visit Type Spiritual support;Social support  Referral From Nurse  Spiritual Encounters  Spiritual Needs Prayer;Emotional  Stress Factors  Patient Stress Factors Family relationships;Health changes

## 2013-07-22 NOTE — Progress Notes (Signed)
Patient's drain came dislodged this afternoon.  Only 10 ml of blood in tube.  Some new drainage appeared on surgical dressing, reinforced.  Zonia Kief PA made aware.

## 2013-07-22 NOTE — Progress Notes (Signed)
Subjective: Patient seen earlier today.  Doing well.  Pain controlled.    Objective: Vital signs in last 24 hours: Temp:  [98.6 F (37 C)-102.3 F (39.1 C)] 99.5 F (37.5 C) (10/09 1609) Pulse Rate:  [77-99] 91 (10/09 1609) Resp:  [14-21] 18 (10/09 0604) BP: (96-123)/(51-89) 123/81 mmHg (10/09 1609) SpO2:  [95 %-99 %] 96 % (10/09 1609)  Intake/Output from previous day: 10/08 0701 - 10/09 0700 In: 1000 [I.V.:1000] Out: -  Intake/Output this shift: Total I/O In: 480 [P.O.:480] Out: -    Recent Labs  07/21/13 1931  HGB 14.7    Recent Labs  07/21/13 1931  WBC 19.9*  RBC 4.66  HCT 40.3  PLT 275    Recent Labs  07/21/13 1931  NA 129*  K 4.1  CL 93*  CO2 21  BUN 17  CREATININE 1.33  GLUCOSE 133*  CALCIUM 8.5    Recent Labs  07/22/13 0728  INR 1.12    Exam:  Collar on.  Neurologically intact.    Assessment/Plan: Anticipate d/c home tomorrow.     Grantham Hippert M 07/22/2013, 5:04 PM

## 2013-07-22 NOTE — Op Note (Signed)
Shane Haney, Shane Haney NO.:  [id number]  MEDICAL RECORD NO.:  [id number]  LOCATION:  [id number]                        FACILITY:  [facility]  PHYSICIAN:  Alvy Beal, MD    DATE OF BIRTH:  12-03-1960  DATE OF PROCEDURE:  07/21/2013 DATE OF DISCHARGE:                              OPERATIVE REPORT   PREOPERATIVE DIAGNOSIS:  Superficial wound infection, cervical spine.  POSTOPERATIVE DIAGNOSIS:  Superficial wound infection, cervical spine.  OPERATIVE PROCEDURE:  I and D of cervical wound.  COMPLICATIONS:  None.  INTRAOPERATIVE FINDINGS:  Superficial purulent material found upon opening of the wound.  Platysma was intact.  There was no deep purulent material.  The plate was inspected, it was firm and secured.  HISTORY:  This is a very pleasant gentleman who underwent a C3-4, C4-5 ACDF on July 08, 2013.  Postoperatively, he did well until this morning.  He presented to my office late this morning with swelling and increased neck pain.  In the office, I expressed purulent material from the wound.  As a result, I elected to take him to the operating room for formal I and D of the wound.  All appropriate risks, benefits, and alternatives of surgery were discussed with the patient.  OPERATIVE NOTE:  The patient was brought to the operating room, placed supine on the operating table.  After successful induction of general anesthesia and endotracheal intubation, TEDs, SCDs were applied.  The arms were secured at the side.  The anterior cervical spine was prepped and draped in a standard fashion.  Time-out was taken to confirm the patient, procedure, and all other pertinent important data.  Once this was completed, we then proceeded with the surgery.  The previous anterior incision was reincised.  The wound noted to express purulent material and intraoperative cultures were taken.  Once they were done, IV antibiotics were administered.  The platysma was noted to be  intact. I then removed the 2-0 Vicryl sutures that were holding the platysma intact and there was no purulent drainage or material below the level of the platysma.  I then dissected bluntly down through the deep cervical and prevertebral fascia.  I identified the carotid sheath, protected it with a finger and then mobilized the esophagus and trachea to the right. I then identified and visualized the anterior cervical plate.  I grabbed the holder with the Kocher, it was firm, it was not loose.  There was no purulent drainage or debris.  I did take another set of deep cultures. I then irrigated this wound copiously with 2.5 L of saline.  Once this was done, there was no active bleeding, so I placed vancomycin powder into the wound for additional prophylaxis to prevent recurrence of the infection.  I then placed a deep drain and brought out of a second stab wound.  I then closed the platysma with two 2-0 Vicryl sutures, and then used a 2-0 Prolene to close the skin.  The wound edges were cleaned at the end of the procedure.  There was no active purulent material being expressed in the wound.  At this point in time, a sterile dry dressing was applied, and the patient was extubated, and  transferred to the PACU without incident.  At the end of the case, all needle and sponge counts were correct.  FIRST ASSISTANT:  Genene Churn. Barry Dienes, [place]     Alvy Beal, MD     DDB/MEDQ  D:  07/21/2013  T:  07/22/2013  Job:  [id number]

## 2013-07-22 NOTE — Progress Notes (Signed)
Patient's temperature at 0339 was T 102.1.  Patient is getting scheduled Acetaminophen IV q 6 hours. Patient was encouraged drink fluids and continue to use his IS.  Next Acetaminophen IV is at 0600. Will continue to monitor.

## 2013-07-22 NOTE — Progress Notes (Signed)
07/22/13 Spoke with patient and his wife about home IV antibiotics. They are both agreeable to learning to give IV antibiotics. They selected [facility]. Contacted Debbie at [facility] and set up [facility] and IV antibiotics, order pending. Will continue to follow for d/c needs. Jacquelynn Cree RN, BSN, CCM

## 2013-07-22 NOTE — Evaluation (Signed)
Occupational Therapy Evaluation Patient Details Name: Shane Haney MRN: [id number] DOB: 05/22/1961 Today's Date: 07/22/2013 Time: 4098-1191 OT Time Calculation (min): 18 min  OT Assessment / Plan / Recommendation History of present illness I and D of cervical wound from ACDF approx 2 weeks ago   Clinical Impression    Pt is at sup - Mod I level with ADLs and ADL mobility and no further acute OT services indicated at this time, all education completed. OT will sign off  OT Assessment  Patient does not need any further OT services    Follow Up Recommendations  No OT follow up    Barriers to Discharge  None    Equipment Recommendations  None recommended by OT    Recommendations for Other Services    Frequency       Precautions / Restrictions Precautions Precautions: Cervical Precaution Comments: Pt able to verbalize cervical precautions and is familiar with handout as he was provided with it previously Required Braces or Orthoses: Cervical Brace Cervical Brace: Hard collar;At all times Restrictions Weight Bearing Restrictions: No   Pertinent Vitals/Pain 5/10    ADL  Grooming: Performed;Wash/dry hands;Wash/dry face Where Assessed - Grooming: Unsupported standing Upper Body Bathing: Supervision/safety;Simulated Lower Body Bathing: Supervision/safety;Simulated Upper Body Dressing: Performed;Supervision/safety Lower Body Dressing: Minimal assistance Toilet Transfer: Performed;Supervision/safety;Modified independent Toilet Transfer Method: Sit to stand Toilet Transfer Equipment: Regular height toilet;Grab bars Toileting - Clothing Manipulation and Hygiene: Modified independent Where Assessed - Toileting Clothing Manipulation and Hygiene: Standing;Sit to stand from 3-in-1 or toilet Tub/Shower Transfer Method: Not assessed Transfers/Ambulation Related to ADLs: Pt Mod I - sup with ADL mobility ADL Comments: pt sup - MOd I with ADLs and is failiar with how to adjust collar  and change pads    OT Diagnosis:    OT Problem List:   OT Treatment Interventions:     OT Goals(Current goals can be found in the care plan section) Acute Rehab OT Goals Patient Stated Goal: to return home  Visit Information  Last OT Received On: 07/22/13 History of Present Illness: I and D of cervical wound from ACDF approx 2 weeks ago       Prior Functioning     Home Living Family/patient expects to be discharged to:: Private residence Living Arrangements: Spouse/significant other;Children Available Help at Discharge: Family Type of Home: House Home Access: Level entry Home Layout: Two level;1/2 bath on main level;Bed/bath upstairs Alternate Level Stairs-Number of Steps: 10-12 Home Equipment: Hand held shower head Prior Function Level of Independence: Independent Communication Communication: No difficulties Dominant Hand: Right         Vision/Perception Vision - History Baseline Vision: Wears glasses all the time Patient Visual Report: No change from baseline Perception Perception: Within Functional Limits   Cognition  Cognition Arousal/Alertness: Awake/alert Behavior During Therapy: WFL for tasks assessed/performed Overall Cognitive Status: Within Functional Limits for tasks assessed    Extremity/Trunk Assessment Upper Extremity Assessment Upper Extremity Assessment: Overall WFL for tasks assessed Lower Extremity Assessment Lower Extremity Assessment: Defer to PT evaluation Cervical / Trunk Assessment Cervical / Trunk Assessment: Normal     Mobility Bed Mobility Bed Mobility: Rolling Left;Left Sidelying to Sit Rolling Left: 6: Modified independent (Device/Increase time) Left Sidelying to Sit: 6: Modified independent (Device/Increase time) Transfers Transfers: Sit to Stand;Stand to Sit Sit to Stand: Without upper extremity assist;From bed;6: Modified independent (Device/Increase time) Stand to Sit: Without upper extremity assist;To bed;6: Modified  independent (Device/Increase time)     Exercise     Balance  Balance Balance Assessed: Yes Dynamic Standing Balance Dynamic Standing - Balance Support: No upper extremity supported;During functional activity Dynamic Standing - Level of Assistance: 6: Modified independent (Device/Increase time);5: Stand by assistance   End of Session OT - End of Session Equipment Utilized During Treatment: Cervical collar Activity Tolerance: Patient tolerated treatment well  GO     Galen Manila 07/22/2013, 1:22 PM

## 2013-07-22 NOTE — Progress Notes (Signed)
Peripherally Inserted Central Catheter/Midline Placement  The IV Nurse has discussed with the patient and/or persons authorized to consent for the patient, the purpose of this procedure and the potential benefits and risks involved with this procedure.  The benefits include less needle sticks, lab draws from the catheter and patient may be discharged home with the catheter.  Risks include, but not limited to, infection, bleeding, blood clot (thrombus formation), and puncture of an artery; nerve damage and irregular heat beat.  Alternatives to this procedure were also discussed.  PICC/Midline Placement Documentation        Timmothy Sours 07/22/2013, 9:27 AM

## 2013-07-22 NOTE — Evaluation (Signed)
Physical Therapy Evaluation Patient Details Name: Shane Haney MRN: [id number] DOB: 1961/07/12 Today's Date: 07/22/2013 Time: 4098-1191 PT Time Calculation (min): 14 min  PT Assessment / Plan / Recommendation History of Present Illness  I and D of cervical wound from ACDF approx 2 weeks ago  Clinical Impression  This patient presents with decreased independence with functional mobility following I&D of cervical wound s/p ACDF approx 2 weeks ago. Pt with sudden onset of chills and shivering when pt transferred to the chair. Pt's lunch came and he reports that he does not feel well and wants to go back to bed. Nursing present in room and takes over care as PT/OT exits. PT to continue seeing pt in the acute setting to address safety and independence with functional mobility, but no follow up PT services are anticipated at d/c.  PT Assessment  Patient needs continued PT services    Follow Up Recommendations  No PT follow up    Does the patient have the potential to tolerate intense rehabilitation      Barriers to Discharge Inaccessible home environment      Equipment Recommendations  None recommended by PT    Recommendations for Other Services     Frequency Min 5X/week    Precautions / Restrictions Precautions Precautions: Cervical Precaution Comments: Pt able to verbalize cervical precautions and is familiar with handout as he was provided with it previously Required Braces or Orthoses: Cervical Brace Cervical Brace: Hard collar;At all times Restrictions Weight Bearing Restrictions: No   Pertinent Vitals/Pain Pt reports 5/10 pain at rest.      Mobility  Bed Mobility Bed Mobility: Supine to Sit;Sitting - Scoot to [place] of Bed;Sit to Supine;Scooting to [facility] Rolling Left: 6: Modified independent (Device/Increase time);With rail Left Sidelying to Sit: 6: Modified independent (Device/Increase time);With rails;HOB flat Sitting - Scoot to Edge of Bed: 6: Modified independent  (Device/Increase time) Sit to Supine: 6: Modified independent (Device/Increase time);HOB flat Scooting to HOB: 6: Modified independent (Device/Increase time);With rail Details for Bed Mobility Assistance: Pt demonstrated good form during log roll and maintained cervical precautions throughout transfer to/from EOB. Transfers Transfers: Sit to Stand;Stand to Sit Sit to Stand: 6: Modified independent (Device/Increase time);Without upper extremity assist;From bed Stand to Sit: 6: Modified independent (Device/Increase time);To chair/3-in-1;To bed;With upper extremity assist Details for Transfer Assistance: Pt demonstrated proper hand placement on seated surface prior to initiating transfer from stand to sit. Pt maintained cervical precautions without cueing. Ambulation/Gait Ambulation/Gait Assistance: 5: Supervision Ambulation Distance (Feet): 25 Feet Assistive device: None Ambulation/Gait Assistance Details: Pt somewhat unsteady on his feet when ambulating to/from bathroom in room, however did not require any physical assistance to maintain balance. Gait Pattern: Step-through pattern;Decreased stride length Gait velocity: decreased Stairs: No    Exercises     PT Diagnosis: Acute pain  PT Problem List: Decreased strength;Decreased range of motion;Decreased activity tolerance;Decreased balance;Decreased mobility;Decreased knowledge of use of DME;Decreased safety awareness;Pain PT Treatment Interventions: DME instruction;Gait training;Stair training;Functional mobility training;Therapeutic activities;Therapeutic exercise;Neuromuscular re-education;Patient/family education     PT Goals(Current goals can be found in the care plan section) Acute Rehab PT Goals Patient Stated Goal: to return home PT Goal Formulation: With patient Time For Goal Achievement: 07/29/13 Potential to Achieve Goals: Good  Visit Information  Last PT Received On: 07/22/13 Assistance Needed: +1 PT/OT  Co-Evaluation/Treatment: Yes History of Present Illness: I and D of cervical wound from ACDF approx 2 weeks ago       Prior Functioning  Home Living Family/patient expects  to be discharged to:: Private residence Living Arrangements: Spouse/significant other;Children Available Help at Discharge: Family Type of Home: House Home Access: Level entry Home Layout: Two level;1/2 bath on main level;Bed/bath upstairs Alternate Level Stairs-Number of Steps: 10-12 Alternate Level Stairs-Rails: Left Home Equipment: Hand held shower head Prior Function Level of Independence: Independent Communication Communication: No difficulties Dominant Hand: Right    Cognition  Cognition Arousal/Alertness: Awake/alert Behavior During Therapy: WFL for tasks assessed/performed Overall Cognitive Status: Within Functional Limits for tasks assessed    Extremity/Trunk Assessment Upper Extremity Assessment Upper Extremity Assessment: Defer to OT evaluation Lower Extremity Assessment Lower Extremity Assessment: Overall WFL for tasks assessed Cervical / Trunk Assessment Cervical / Trunk Assessment: Other exceptions Cervical / Trunk Exceptions: In hard collar   Balance Balance Balance Assessed: Yes Dynamic Standing Balance Dynamic Standing - Balance Support: No upper extremity supported;During functional activity Dynamic Standing - Level of Assistance: 6: Modified independent (Device/Increase time);5: Stand by assistance  End of Session PT - End of Session Activity Tolerance: Patient limited by fatigue Patient left: in bed;with call bell/phone within reach;with nursing/sitter in room Nurse Communication: Mobility status  GP     Ruthann Cancer 07/22/2013, 1:50 PM  Ruthann Cancer, PT, DPT Acute Rehabilitation Services

## 2013-07-22 NOTE — Progress Notes (Signed)
UR COMPLETED  

## 2013-07-23 ENCOUNTER — Encounter (HOSPITAL_COMMUNITY): Payer: Self-pay | Admitting: Orthopedic Surgery

## 2013-07-23 LAB — CBC WITH DIFFERENTIAL/PLATELET
Eosinophils Absolute: 0.5 10*3/uL (ref 0.0–0.7)
Eosinophils Relative: 5 % (ref 0–5)
HCT: 40.2 % (ref 39.0–52.0)
Hemoglobin: 14.3 g/dL (ref 13.0–17.0)
Lymphocytes Relative: 10 % — ABNORMAL LOW (ref 12–46)
Lymphs Abs: 1.1 10*3/uL (ref 0.7–4.0)
MCH: 31.2 pg (ref 26.0–34.0)
MCV: 87.6 fL (ref 78.0–100.0)
Monocytes Absolute: 0.7 10*3/uL (ref 0.1–1.0)
Monocytes Relative: 7 % (ref 3–12)
Platelets: 255 10*3/uL (ref 150–400)
RBC: 4.59 MIL/uL (ref 4.22–5.81)

## 2013-07-23 MED ORDER — HEPARIN SOD (PORK) LOCK FLUSH 100 UNIT/ML IV SOLN
250.0000 [IU] | INTRAVENOUS | Status: AC | PRN
  Administered 2013-07-23: 500 [IU]

## 2013-07-23 MED ORDER — DEXTROSE 5 % IV SOLN: 2.0000 g | INTRAVENOUS | Status: AC

## 2013-07-23 MED ORDER — CEFAZOLIN SODIUM 1-5 GM-% IV SOLN: INTRAVENOUS | Status: DC

## 2013-07-23 MED ORDER — DEXTROSE 5 % IV SOLN
2.0000 g | INTRAVENOUS | Status: DC
  Administered 2013-07-23: 2 g via INTRAVENOUS
  Filled 2013-07-23: qty 2

## 2013-07-23 MED ORDER — GABAPENTIN 300 MG PO CAPS: 300.0000 mg | ORAL_CAPSULE | Freq: Three times a day (TID) | ORAL | Status: DC

## 2013-07-23 MED ORDER — OXYCODONE-ACETAMINOPHEN 5-325 MG PO TABS: 1.0000 | ORAL_TABLET | ORAL | Status: DC | PRN

## 2013-07-23 MED ORDER — METHOCARBAMOL 500 MG PO TABS: 500.0000 mg | ORAL_TABLET | Freq: Four times a day (QID) | ORAL | Status: DC | PRN

## 2013-07-23 NOTE — Progress Notes (Addendum)
To be discharged today, continue Ancef and we will monitor culture for any changes.    ADDENDUM: now growing Group A Strep.  Will change IV therapy to Ceftriaxone 2 grams daily for same duration.   GNR also noted but doubt its signficance with 2 cultures growing group A strep.

## 2013-07-23 NOTE — Progress Notes (Signed)
Patient ID: Shane Haney, male   DOB: April 20, 1961, 52 y.o.   MRN: [id number]  Subjectively:  He is doing well. He complains of night sweats and chills over night but says he has not felt febrile. He also complains of headaches on waking up.  The soreness over the incision site remains.  Objectively (10/10 0600): Temp: 99.6  Pulse: 88 Resp: 16 BP: 136/73 SpO2: 96  Resp: Normal vesicular breathing B/L, no added sounds CVS: S1 + S2 + 0 Abdominal: soft, non tender, gs +ve  10/10 0620  WBC  10.3 RBC  4.59 Hemoglobin  14.3 HCT  40.2 Platelets  255    Hospital Medications    0.9 % sodium chloride infusion  ceFAZolin (ANCEF) IVPB 1 g/50 mL premix  docusate sodium (COLACE) capsule 100 mg  lactated ringers infusion  lisinopril (PRINIVIL,ZESTRIL) tablet 20 mg  menthol-cetylpyridinium (CEPACOL) lozenge 3 mg  methocarbamol (ROBAXIN) 500 mg in dextrose 5 % 50 mL IVPB  methocarbamol (ROBAXIN) tablet 500 mg  morphine 2 MG/ML injection 1-4 mg  ondansetron (ZOFRAN) injection 4 mg  oxyCODONE-acetaminophen (PERCOCET/ROXICET) 5-325 MG per tablet 1-2 tablet  phenol (CHLORASEPTIC) mouth spray 1 spray  polyethylene glycol (MIRALAX / GLYCOLAX) packet 17 g  sodium chloride 0.9 % injection 10-40 mL  sodium chloride 0.9 % injection 3 mL  sodium chloride 0.9 % injection 3 mL  zolpidem (AMBIEN) tablet 5 mg

## 2013-07-23 NOTE — Progress Notes (Signed)
Patient d/c to home this afternoon, prescriptions given and instructions given and reviewed with patient.  [facility] will contact patient in the morning for PICC line IV antibiotic therapy.

## 2013-07-23 NOTE — Progress Notes (Signed)
Agree with updated goals added this session.  Ruthann Cancer, PT, DPT Acute Rehabilitation Services

## 2013-07-23 NOTE — Progress Notes (Signed)
Physical Therapy Treatment Patient Details Name: Shane Haney MRN: [id number] DOB: May 27, 1961 Today's Date: 07/23/2013 Time: 4098-1191 PT Time Calculation (min): 15 min  PT Assessment / Plan / Recommendation  History of Present Illness I and D of cervical wound from ACDF approx 2 weeks ago   PT Comments   Pt moving well.  Practiced stairs today due to pt states he has flight to get to his bedroom.  Pt states he feels he is at baseline mobility.     Pt safe from PT standpoint to d/c home when MD feels medically ready.     Follow Up Recommendations  No PT follow up     Does the patient have the potential to tolerate intense rehabilitation     Barriers to Discharge        Equipment Recommendations  None recommended by PT    Recommendations for Other Services    Frequency Min 5X/week   Progress towards PT Goals    Plan Current plan remains appropriate    Precautions / Restrictions Precautions Precautions: Cervical Required Braces or Orthoses: Cervical Brace Cervical Brace: Hard collar;At all times Restrictions Weight Bearing Restrictions: No   Pertinent Vitals/Pain Reports neck discomfort but did not rate.      Mobility  Bed Mobility Bed Mobility: Not assessed Details for Bed Mobility Assistance: Pt sitting on EOB upon arrival.   Transfers Transfers: Sit to Stand;Stand to Sit Sit to Stand: 6: Modified independent (Device/Increase time);From bed Stand to Sit: 6: Modified independent (Device/Increase time);With armrests;To chair/3-in-1 Ambulation/Gait Ambulation/Gait Assistance: 5: Supervision Ambulation Distance (Feet): 150 Feet Assistive device: None Ambulation/Gait Assistance Details: Pt with limp due to "bum" knee per pt but moves well.  Pt states he has been ambulating around room, to/from bathroom by himself.   Gait Pattern: Step-through pattern (L LE limp) Stairs: Yes Stairs Assistance: 4: Min guard Stair Management Technique: One rail Left;Step to  pattern;Forwards Number of Stairs: 8 Wheelchair Mobility Wheelchair Mobility: No      PT Goals (current goals can now be found in the care plan section) Acute Rehab PT Goals PT Goal Formulation: With patient Time For Goal Achievement: 07/29/13 Potential to Achieve Goals: Good  Visit Information  Last PT Received On: 07/23/13 Assistance Needed: +1 History of Present Illness: I and D of cervical wound from ACDF approx 2 weeks ago    Subjective Data      Cognition  Cognition Arousal/Alertness: Awake/alert Behavior During Therapy: WFL for tasks assessed/performed Overall Cognitive Status: Within Functional Limits for tasks assessed    Balance     End of Session PT - End of Session Equipment Utilized During Treatment: Gait belt;Cervical collar Activity Tolerance: Patient tolerated treatment well Patient left: in chair;with family/visitor present;with call bell/phone within reach Nurse Communication: Mobility status   GP     Lara Mulch 07/23/2013, 10:52 AM  Verdell Face, PTA [phone] 07/23/2013

## 2013-07-23 NOTE — Progress Notes (Signed)
07/23/13 Contacted Debbie at [facility], they are aware of order for lab draws and wound check. They will see patient on 07/24/13 to start home IV rocephin. Jacquelynn Cree RN, BSN, CCM   07/22/13 Spoke with patient and his wife about home IV antibiotics. They are both agreeable to learning to give IV antibiotics. They selected [facility]. Contacted Debbie at [facility] and set up [facility] and IV antibiotics, order pending. Will continue to follow for d/c needs. Jacquelynn Cree RN, BSN, CCM

## 2013-07-23 NOTE — Progress Notes (Signed)
Subjective: Doing well.  Some throat soreness.  Denies dysphagia, odynophagia, dyspnea.  Wants to go home.     Objective: Vital signs in last 24 hours: Temp:  [98.6 F (37 C)-100.2 F (37.9 C)] 99.6 F (37.6 C) (10/10 0600) Pulse Rate:  [88-96] 88 (10/10 0600) Resp:  [16-18] 16 (10/10 0600) BP: (122-140)/(73-88) 136/73 mmHg (10/10 0723) SpO2:  [95 %-97 %] 96 % (10/10 0600)  Intake/Output from previous day: 10/09 0701 - 10/10 0700 In: 1620 [P.O.:600; I.V.:1020] Out: 2 [Urine:2] Intake/Output this shift:     Recent Labs  07/21/13 1931 07/22/13 1730 07/23/13 0620  HGB 14.7 14.1 14.3    Recent Labs  07/22/13 1730 07/23/13 0620  WBC 11.7* 10.3  RBC 4.45 4.59  HCT 38.8* 40.2  PLT 241 255    Recent Labs  07/21/13 1931  NA 129*  K 4.1  CL 93*  CO2 21  BUN 17  CREATININE 1.33  GLUCOSE 133*  CALCIUM 8.5    Recent Labs  07/22/13 0728  INR 1.12    Exam:  Steri strips intact.  serosang drainage on dressing. No purulent drainage.  Drain fell out yesterday.  New dressing applied. Some throat swelling.  Lymph nodes tender.  Neurologically intact.    Assessment/Plan: Deep wound cultures adjacent to the plate are unremarkable.  This appears to be a superficial wound infection with the superficial culture having gram pos cocci.  Cultures pending.  Will plan to d/c home today on IV ancef X 4 weeks.  Will f/u with ID per their recommendation.  F/u with Dr Shon Baton  In one week.  Return sooner if needed.  Understands to call if he has fever, increased drainage or swelling, dysphagia, difficulty breathing.     Shane Haney M 07/23/2013, 8:07 AM

## 2013-07-24 LAB — WOUND CULTURE

## 2013-07-27 ENCOUNTER — Telehealth: Payer: Self-pay | Admitting: Licensed Clinical Social Worker

## 2013-07-27 NOTE — Discharge Summary (Signed)
  ABBREVIATED DISCHARGE SUMMARY      DATE OF HOSPITALIZATION:  21 July 2013  REASON FOR HOSPITALIZATION:  52 yo wf with infected cervical wound, drainage and pain.  S/p cervical ACDF.      SIGNIFICANT FINDINGS:  Wound infection  OPERATION:  Cervical wound I&D  FINAL DIAGNOSIS:  same  SECONDARY DIAGNOSIS: none  CONSULTANTS:  none  DISCHARGE CONDITION:  STABLE  DISCHARGED TO:  HOME

## 2013-07-27 NOTE — Telephone Encounter (Signed)
Patient's nurse called stating that the patient did not want anyone coming to his house and he would not let her come today, but she was able to convince him to let her come tomorrow for blood draw and picc care. She asked if he would not let her come out in the future could he come here. I told her that the patient has his first appointment on 10/24 to Dr. Drue Second and we could address it at that visit.

## 2013-07-27 NOTE — Discharge Summary (Signed)
No evidence of deep cervical infection Plan on aggressive management of superficial infection IV abxs for 4 weeks then convert to oral if needed F/u 1 week for wound check.

## 2013-07-31 LAB — ANAEROBIC CULTURE

## 2013-08-06 ENCOUNTER — Encounter: Payer: Self-pay | Admitting: Internal Medicine

## 2013-08-06 ENCOUNTER — Ambulatory Visit (INDEPENDENT_AMBULATORY_CARE_PROVIDER_SITE_OTHER): Payer: BC Managed Care – PPO | Admitting: Internal Medicine

## 2013-08-06 VITALS — BP 123/82 | HR 79 | Temp 98.7°F | Ht 70.0 in | Wt 259.0 lb

## 2013-08-06 DIAGNOSIS — T889XXS Complication of surgical and medical care, unspecified, sequela: Secondary | ICD-10-CM

## 2013-08-15 NOTE — Progress Notes (Signed)
[facility] NOTE  RFV: follow up for superficial wound infection GAS early post op wound infection Subjective:    Patient ID: Shane Haney, male    DOB: [birth year], 52 y.o.   MRN: [id number]  HPI Shane Haney is a 52 y.o. 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 He was seen and taken to OR for debridement. In the OR, purulence was noted, nothing in deep tissue or any concerns with plate. Cultures grew out group a strep, although gram stain did show GNR but nothing other than Group A strep was identified. No history of MRSA . The patient denies touching his incision post operatively. He states that he wore his neck brace as instructed by Dr. Shon Baton. Since being on antibiotics, he states that he tolerates infusion of antibiotics without difficulty. No rahs, no pain at picc line site. No erythema.  Current Outpatient Prescriptions on File Prior to Visit  Medication Sig Dispense Refill  . dextrose 5 % SOLN 50 mL with cefTRIAXone 2 G SOLR 2 g Inject 2 g into the vein daily.      Marland Kitchen docusate sodium (COLACE) 100 MG capsule Take 1 capsule (100 mg total) by mouth 2 (two) times daily.  40 capsule  0  . gabapentin (NEURONTIN) 300 MG capsule Take 1 capsule (300 mg total) by mouth 3 (three) times daily.  90 capsule  0  . lisinopril (PRINIVIL,ZESTRIL) 20 MG tablet Take 20 mg by mouth daily before breakfast.       . ondansetron (ZOFRAN ODT) 4 MG disintegrating tablet Take 1 tablet (4 mg total) by mouth every 8 (eight) hours as needed for nausea.  30 tablet  0  . oxyCODONE-acetaminophen (PERCOCET/ROXICET) 5-325 MG per tablet Take 1-2 tablets by mouth every 4 (four) hours as needed.  50 tablet  0  . methocarbamol (ROBAXIN) 500 MG tablet Take 1 tablet (500 mg total) by mouth every 6 (six) hours as needed.  40 tablet  0   No current  facility-administered medications on file prior to visit.   Active Ambulatory Problems    Diagnosis Date Noted  . HYPERLIPIDEMIA-MIXED 01/24/2009  . HYPERTENSION, BENIGN ESSENTIAL 08/26/2007  . HEMORRHOIDS, INTERNAL 08/26/2007  . DIVERTICULOSIS, COLON 08/28/2007  . DEGENERATIVE JOINT DISEASE 10/02/2009  . PARESTHESIA, HANDS 08/26/2007  . Left knee pain 04/19/2013  . Obesity (BMI 30-39.9) 06/05/2013  . Spinal stenosis in cervical region 06/05/2013  . CTS (carpal tunnel syndrome) 06/05/2013  . Myalgia and myositis 06/05/2013   Resolved Ambulatory Problems    Diagnosis Date Noted  . SINUSITIS- ACUTE-NOS 10/28/2007  . URI 09/29/2007  . RHINITIS 03/17/2009  . INFLUENZA 07/01/2008  . EPIDIDYMITIS 09/29/2007  . Nonspecific abnormal electrocardiogram (ECG) (EKG) 08/26/2007  . SHOULDER PAIN, LEFT 10/12/2010   Past Medical History  Diagnosis Date  . Hyperlipidemia   . H/O echocardiogram 2010  . Hypertension   . Neuromuscular disorder   . Dysrhythmia   . Snores   . Osteoarthritis     Review of Systems 12 point ROS is negative, no fever, chills, nightsweats, no headache. No swelling to incision site    Objective:   Physical Exam BP 123/82  Pulse 79  Temp(Src) 98.7 F (37.1 C) (Oral)  Ht 5\' 10"  (1.778 m)  Wt 259 lb (117.482 kg)  BMI 37.16 kg/m2 Physical Exam  Constitutional: He is oriented  to person, place, and time. He appears well-developed and well-nourished. No distress.  HENT: wearing a brace. When removed no pain , incision healing well. Mouth/Throat: Oropharynx is clear and moist. No oropharyngeal exudate.  Cardiovascular: Normal rate, regular rhythm and normal heart sounds. Exam reveals no gallop and no friction rub.  No murmur heard.  Pulmonary/Chest: Effort normal and breath sounds normal. No respiratory distress. He has no wheezes.  Lymphadenopathy:   no cervical adenopathy.  Skin: Skin is warm and dry. No rash noted. No erythema.  Ext: picc line  c/d/i Psychiatric: He has a normal mood and affect. His behavior is normal.       Assessment & Plan:  Group a strep superficial wound infection after anterior cervical  Fusion = has completed 2 of  4 wks of ceftriaxone 2gm IV daily for post op wound infection. No other cases of group A strep has been detected during this time period, which would be concerning for nosocomial causes. Patient appears to be doing well. Tolerating antibiotics without difficulty.  rtc in 2 wk at the completion of antibiotic course. Will check crp and sed rate at that time to ensure it is normalized. Not likely to require further antibiotics than 4 wks if he continues to improve as he has done thus far.

## 2013-08-16 ENCOUNTER — Encounter: Payer: BC Managed Care – PPO | Admitting: Gastroenterology

## 2013-08-17 ENCOUNTER — Telehealth: Payer: Self-pay | Admitting: Licensed Clinical Social Worker

## 2013-08-17 NOTE — Telephone Encounter (Signed)
RN from [facility] called stating that the patient accidentally pulled his picc line out about 8 inches, RN wanted to know if she could pull picc out today and d/c antibiotics since the patient was due to end on 11/6 and see Dr. Drue Second that day. I called Dr. Drue Second and she gave the verbal order to pull picc and d/c antibiotics

## 2013-08-19 ENCOUNTER — Ambulatory Visit (INDEPENDENT_AMBULATORY_CARE_PROVIDER_SITE_OTHER): Payer: BC Managed Care – PPO | Admitting: Internal Medicine

## 2013-08-19 ENCOUNTER — Encounter: Payer: Self-pay | Admitting: Internal Medicine

## 2013-08-19 VITALS — BP 123/83 | HR 69 | Temp 98.7°F | Wt 265.0 lb

## 2013-08-19 DIAGNOSIS — T889XXS Complication of surgical and medical care, unspecified, sequela: Secondary | ICD-10-CM

## 2013-08-19 NOTE — Progress Notes (Signed)
[facility] NOTE  RFV: routine   Subjective:    Patient ID: Shane Haney, male    DOB: January 23, 1961, 52 y.o.   MRN: [id number]  HPI Shane Haney is a 52 y.o. 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 He was seen and taken to OR for debridement. In the OR, purulence was noted, nothing in deep tissue or any concerns with plate. Cultures grew out group a strep, although gram stain did show GNR but nothing other than Group A strep was identified. No history of MRSA . The patient denies touching his incision post operatively. He states that he wore his neck brace as instructed by Dr. Shon Baton. Since being on antibiotics, he states that he tolerates infusion of antibiotics without difficulty. No rahs, no pain at picc line site. No erythema.  His picc line fell out 2 days ago, but now feeling that in the last 48hrs, that he is having numbness and heaviness of arms. This morning noticing left foot "pins and needles" . No tripping, no dropping of objects, no weakness. No recent fall or injury   Current Outpatient Prescriptions on File Prior to Visit  Medication Sig Dispense Refill  . gabapentin (NEURONTIN) 300 MG capsule Take 1 capsule (300 mg total) by mouth 3 (three) times daily.  90 capsule  0  . lisinopril (PRINIVIL,ZESTRIL) 20 MG tablet Take 20 mg by mouth daily before breakfast.       . docusate sodium (COLACE) 100 MG capsule Take 1 capsule (100 mg total) by mouth 2 (two) times daily.  40 capsule  0   No current facility-administered medications on file prior to visit.   Active Ambulatory Problems    Diagnosis Date Noted  . HYPERLIPIDEMIA-MIXED 01/24/2009  . HYPERTENSION, BENIGN ESSENTIAL 08/26/2007  . HEMORRHOIDS, INTERNAL 08/26/2007  . DIVERTICULOSIS, COLON 08/28/2007  . DEGENERATIVE JOINT DISEASE 10/02/2009  .  PARESTHESIA, HANDS 08/26/2007  . Left knee pain 04/19/2013  . Obesity (BMI 30-39.9) 06/05/2013  . Spinal stenosis in cervical region 06/05/2013  . CTS (carpal tunnel syndrome) 06/05/2013  . Myalgia and myositis 06/05/2013   Resolved Ambulatory Problems    Diagnosis Date Noted  . SINUSITIS- ACUTE-NOS 10/28/2007  . URI 09/29/2007  . RHINITIS 03/17/2009  . INFLUENZA 07/01/2008  . EPIDIDYMITIS 09/29/2007  . Nonspecific abnormal electrocardiogram (ECG) (EKG) 08/26/2007  . SHOULDER PAIN, LEFT 10/12/2010   Past Medical History  Diagnosis Date  . Hyperlipidemia   . H/O echocardiogram 2010  . Hypertension   . Neuromuscular disorder   . Dysrhythmia   . Snores   . Osteoarthritis     Review of Systems See hpi    Objective:   Physical Exam BP 123/83  Pulse 69  Temp(Src) 98.7 F (37.1 C) (Oral)  Wt 265 lb (120.203 kg) Physical Exam  Constitutional: He is oriented to person, place, and time. He appears well-developed and well-nourished. No distress.  HENT:  Mouth/Throat: Oropharynx is clear and moist. No oropharyngeal exudate.  Cardiovascular: Normal rate, regular rhythm and normal heart sounds. Exam reveals no gallop and no friction rub.  No murmur heard.  Pulmonary/Chest: Effort normal and breath sounds normal. No respiratory distress. He has no wheezes.  Abdominal: Soft. Bowel sounds are normal. He exhibits no distension. There is no tenderness.  Lymphadenopathy:  He has no cervical adenopathy.  Neurological: He is alert and  oriented to person, place, and time.  Skin: Skin is warm and dry. No rash noted. No erythema.  Psychiatric: He has a normal mood and affect. His behavior is normal.       Assessment & Plan:  Group A strep superficial wound infection s/p debridement, post-surgical infection = has finished nearly 4 wks of ceftriaxone.picc line fell out and removed on 11/4. Will check sed rate and crp today to ensure they have normalized. If still elevated, we will give  oral antibiotics  Numbness/paresthesia = to see dr. Shon Baton tomorrow am for evaluation

## 2013-09-09 ENCOUNTER — Other Ambulatory Visit: Payer: Self-pay | Admitting: Family Medicine

## 2013-09-10 NOTE — Telephone Encounter (Signed)
Last seen 06/04/13 and filled 03/11/13 #90. Please advise     KP

## 2013-09-17 ENCOUNTER — Other Ambulatory Visit: Payer: Self-pay | Admitting: Family Medicine

## 2013-12-03 ENCOUNTER — Ambulatory Visit (INDEPENDENT_AMBULATORY_CARE_PROVIDER_SITE_OTHER): Payer: BC Managed Care – PPO | Admitting: Family Medicine

## 2013-12-03 ENCOUNTER — Encounter: Payer: Self-pay | Admitting: Family Medicine

## 2013-12-03 VITALS — BP 120/80 | HR 64 | Temp 98.3°F | Wt 275.0 lb

## 2013-12-03 DIAGNOSIS — J019 Acute sinusitis, unspecified: Secondary | ICD-10-CM

## 2013-12-03 DIAGNOSIS — R05 Cough: Secondary | ICD-10-CM

## 2013-12-03 DIAGNOSIS — R059 Cough, unspecified: Secondary | ICD-10-CM

## 2013-12-03 MED ORDER — CEFUROXIME AXETIL 500 MG PO TABS: 500.0000 mg | ORAL_TABLET | Freq: Two times a day (BID) | ORAL | Status: AC

## 2013-12-03 MED ORDER — GUAIFENESIN-CODEINE 100-10 MG/5ML PO SYRP: 5.0000 mL | ORAL_SOLUTION | Freq: Three times a day (TID) | ORAL | Status: DC | PRN

## 2013-12-03 NOTE — Progress Notes (Signed)
  Subjective:     Shane Haney is a 53 y.o. male who presents for evaluation of sinus pain. Symptoms include: congestion, facial pain, headaches, nasal congestion and sinus pressure. Onset of symptoms was 2 weeks ago. Symptoms have been gradually worsening since that time. Past history is significant for no history of pneumonia or bronchitis. Patient is a non-smoker.  The following portions of the patient's history were reviewed and updated as appropriate: allergies, current medications, past family history, past medical history, past social history, past surgical history and problem list.  Review of Systems Pertinent items are noted in HPI.   Objective:    BP 120/80  Pulse 64  Temp(Src) 98.3 F (36.8 C) (Oral)  Wt 275 lb (124.739 kg)  SpO2 95% General appearance: alert, cooperative, appears stated age and no distress Ears: normal TM's and external ear canals both ears Nose: green discharge, moderate congestion, turbinates red, swollen, sinus tenderness bilateral Throat: lips, mucosa, and tongue normal; teeth and gums normal Neck: no adenopathy, supple, symmetrical, trachea midline and thyroid not enlarged, symmetric, no tenderness/mass/nodules Lungs: clear to auscultation bilaterally Heart: S1, S2 normal    Assessment:    Acute bacterial sinusitis.    Plan:    Nasal steroids per medication orders. Antihistamines per medication orders. Ceftin per medication orders.

## 2013-12-03 NOTE — Patient Instructions (Signed)

## 2013-12-03 NOTE — Progress Notes (Signed)
Pre visit review using our clinic review tool, if applicable. No additional management support is needed unless otherwise documented below in the visit note. 

## 2013-12-09 ENCOUNTER — Other Ambulatory Visit: Payer: Self-pay | Admitting: Family Medicine

## 2014-03-11 ENCOUNTER — Other Ambulatory Visit: Payer: Self-pay | Admitting: Family Medicine

## 2014-03-11 NOTE — Telephone Encounter (Signed)
Last seen 12/03/13 and filled 12/10/13 #90. Please advise     KP

## 2014-03-29 ENCOUNTER — Encounter (HOSPITAL_COMMUNITY): Payer: Self-pay | Admitting: Pharmacy Technician

## 2014-04-03 NOTE — H&P (Signed)
TOTAL KNEE ADMISSION H&P  Patient is being admitted for left total knee arthroplasty, with removal of hardware.  Subjective:  Chief Complaint:       Left knee OA / pain.  HPI: Shane Haney, 53 y.o. male, has a history of pain and functional disability in the left knee due to arthritis and has failed non-surgical conservative treatments for greater than 12 weeks to includeNSAID's and/or analgesics, corticosteriod injections, use of assistive devices and activity modification.  Onset of symptoms was gradual, starting >10 years ago with gradually worsening course since that time. The patient noted prior procedures on the knee to include  arthroscopy and ACL reconstruction on the left knee(s).  Patient currently rates pain in the left knee(s) at 9 out of 10 with activity. Patient has night pain, worsening of pain with activity and weight bearing, pain that interferes with activities of daily living, pain with passive range of motion, crepitus and joint swelling.  Patient has evidence of periarticular osteophytes and joint space narrowing by imaging studies.  There is no active infection.  Risks, benefits and expectations were discussed with the patient.  Risks including but not limited to the risk of anesthesia, blood clots, nerve damage, blood vessel damage, failure of the prosthesis, infection and up to and including death.  Patient understand the risks, benefits and expectations and wishes to proceed with surgery.   PCP: Garnet Koyanagi, DO  D/C Plans:      Home with HHPT  Post-op Meds:       No Rx given  Tranexamic Acid:      Is to be given  Decadron:      Is to be given  FYI:     ASA post-op  Norco post-op   Patient Active Problem List   Diagnosis Date Noted  . Obesity (BMI 30-39.9) 06/05/2013  . Spinal stenosis in cervical region 06/05/2013  . CTS (carpal tunnel syndrome) 06/05/2013  . Myalgia and myositis 06/05/2013  . Left knee pain 04/19/2013  . DEGENERATIVE JOINT DISEASE  10/02/2009  . HYPERLIPIDEMIA-MIXED 01/24/2009  . DIVERTICULOSIS, COLON 08/28/2007  . HYPERTENSION, BENIGN ESSENTIAL 08/26/2007  . HEMORRHOIDS, INTERNAL 08/26/2007  . PARESTHESIA, HANDS 08/26/2007   Past Medical History  Diagnosis Date  . Hyperlipidemia   . H/O echocardiogram 2010    seen by Dr. Stanford Breed- told to return as needed   . Hypertension     Dr, Stanford Breed told pt. in 2010, ? extra beats.  . Neuromuscular disorder     spinal cord compression, carpal tunnel - R hand    . Dysrhythmia     "irregular at one time" (07/21/2013)  . Snores   . Osteoarthritis     "knees and wrists" (07/21/2013)    Past Surgical History  Procedure Laterality Date  . Anterior cruciate ligament repair Left 1999  . Knee arthroscopy Right 2007  . Open reduction internal fixation (orif) foot lisfranc fracture Right 1993    R- /w ORIF  . Tonsillectomy    . Carpal tunnel release Left 05/2013  . Anterior cervical decomp/discectomy fusion N/A 07/08/2013    Procedure: ANTERIOR CERVICAL DECOMPRESSION/DISCECTOMY FUSION 2 LEVELS C3-C5;  Surgeon: Melina Schools, MD;  Location: [place];  Service: Orthopedics;  Laterality: N/A;  . Carpal tunnel release Right 07/08/2013    Procedure: LIMITED OPEN RIGHT CARPAL TUNNEL RELEASE;  Surgeon: Roseanne Kaufman, MD;  Location: [place];  Service: Orthopedics;  Laterality: Right;  . Incision and drainage of wound N/A 07/21/2013    Procedure: IRRIGATION  AND DEBRIDEMENT OF CERVICAL WOUND;  Surgeon: Melina Schools, MD;  Location: [place];  Service: Orthopedics;  Laterality: N/A;    No prescriptions prior to admission   No Known Allergies   History  Substance Use Topics  . Smoking status: Never Smoker   . Smokeless tobacco: Never Used  . Alcohol Use: 2.4 oz/week    2 Cans of beer, 2 Shots of liquor per week     Comment: 07/21/2013 "3-5 mixed drinks or 6 beers on the weekends    Family History  Problem Relation Age of Onset  . Stroke Father   . Hypertension Father   . Alcohol abuse  Father   . Hypertension Mother   . Aneurysm Mother     Brain  . HIV Brother     passed away from AIDS     Review of Systems  Constitutional: Negative.   HENT: Negative.   Eyes: Negative.   Respiratory: Negative.   Cardiovascular: Negative.   Gastrointestinal: Negative.   Genitourinary: Negative.   Musculoskeletal: Positive for joint pain.  Skin: Negative.   Neurological: Negative.   Endo/Heme/Allergies: Negative.   Psychiatric/Behavioral: Negative.     Objective:  Physical Exam  Constitutional: He is oriented to person, place, and time. He appears well-developed and well-nourished.  HENT:  Head: Normocephalic and atraumatic.  Mouth/Throat: Oropharynx is clear and moist.  Eyes: Pupils are equal, round, and reactive to light.  Neck: Neck supple. No JVD present. No tracheal deviation present. No thyromegaly present.  Cardiovascular: Normal rate, regular rhythm, normal heart sounds and intact distal pulses.   Respiratory: Effort normal and breath sounds normal. No stridor. No respiratory distress. He has no wheezes.  GI: Soft. There is no tenderness. There is no guarding.  Musculoskeletal:       Left knee: He exhibits decreased range of motion, swelling, laceration (healed) and bony tenderness. He exhibits no ecchymosis, no deformity, no erythema and normal alignment. Tenderness found.  Lymphadenopathy:    He has no cervical adenopathy.  Neurological: He is alert and oriented to person, place, and time.  Skin: Skin is warm and dry.  Psychiatric: He has a normal mood and affect.     Labs:  Estimated body mass index is 39.46 kg/(m^2) as calculated from the following:   Height as of 08/06/13: 5\' 10"  (1.778 m).   Weight as of 12/03/13: 124.739 kg (275 lb).   Imaging Review Plain radiographs demonstrate severe degenerative joint disease of the left knee(s). The overall alignment is  neutral. The bone quality appears to be good for age and reported activity  level.  Assessment/Plan:  End stage arthritis, left knee   The patient history, physical examination, clinical judgment of the provider and imaging studies are consistent with end stage degenerative joint disease of the left knee(s) and total knee arthroplasty with removal of hardware is deemed medically necessary. The treatment options including medical management, injection therapy arthroscopy and arthroplasty were discussed at length. The risks and benefits of total knee arthroplasty were presented and reviewed. The risks due to aseptic loosening, infection, stiffness, patella tracking problems, thromboembolic complications and other imponderables were discussed. The patient acknowledged the explanation, agreed to proceed with the plan and consent was signed. Patient is being admitted for inpatient treatment for surgery, pain control, PT, OT, prophylactic antibiotics, VTE prophylaxis, progressive ambulation and ADL's and discharge planning. The patient is planning to be discharged home with home health services.      West Pugh Babish   PA-C  04/03/2014, 7:28 PM

## 2014-04-05 ENCOUNTER — Encounter (HOSPITAL_COMMUNITY): Payer: Self-pay

## 2014-04-05 ENCOUNTER — Encounter (HOSPITAL_COMMUNITY)
Admission: RE | Admit: 2014-04-05 | Discharge: 2014-04-05 | Disposition: A | Discharge status: Home / Self Care | Payer: BC Managed Care – PPO | Source: Ambulatory Visit | Attending: Orthopedic Surgery | Admitting: Orthopedic Surgery

## 2014-04-05 DIAGNOSIS — Z01812 Encounter for preprocedural laboratory examination: Secondary | ICD-10-CM | POA: Insufficient documentation

## 2014-04-05 DIAGNOSIS — Z01818 Encounter for other preprocedural examination: Secondary | ICD-10-CM | POA: Insufficient documentation

## 2014-04-05 LAB — BASIC METABOLIC PANEL
BUN: 11 mg/dL (ref 6–23)
CHLORIDE: 98 meq/L (ref 96–112)
CO2: 25 mEq/L (ref 19–32)
Calcium: 9.3 mg/dL (ref 8.4–10.5)
Creatinine, Ser: 0.92 mg/dL (ref 0.50–1.35)
GFR calc non Af Amer: >90 mL/min (ref >90)
Glucose, Bld: 103 mg/dL — ABNORMAL HIGH (ref 70–99)
Potassium: 4.4 mEq/L (ref 3.7–5.3)
Sodium: 137 mEq/L (ref 137–147)

## 2014-04-05 LAB — CBC
HEMATOCRIT: 44.3 % (ref 39.0–52.0)
HEMOGLOBIN: 15.4 g/dL (ref 13.0–17.0)
MCH: 31.6 pg (ref 26.0–34.0)
MCHC: 34.8 g/dL (ref 30.0–36.0)
MCV: 91 fL (ref 78.0–100.0)
Platelets: 251 10*3/uL (ref 150–400)
RBC: 4.87 MIL/uL (ref 4.22–5.81)
RDW: 12.6 % (ref 11.5–15.5)
WBC: 5.4 10*3/uL (ref 4.0–10.5)

## 2014-04-05 LAB — URINALYSIS, ROUTINE W REFLEX MICROSCOPIC
Bilirubin Urine: NEGATIVE
Glucose, UA: NEGATIVE mg/dL
Hgb urine dipstick: NEGATIVE
KETONES UR: NEGATIVE mg/dL
Leukocytes, UA: NEGATIVE
NITRITE: NEGATIVE
Protein, ur: NEGATIVE mg/dL
SPECIFIC GRAVITY, URINE: 1.026 (ref 1.005–1.030)
Urobilinogen, UA: 0.2 mg/dL (ref 0.0–1.0)
pH: 6 (ref 5.0–8.0)

## 2014-04-05 LAB — PROTIME-INR
INR: 0.99 (ref 0.00–1.49)
Prothrombin Time: 12.9 seconds (ref 11.6–15.2)

## 2014-04-05 LAB — SURGICAL PCR SCREEN
MRSA, PCR: NEGATIVE
Staphylococcus aureus: NEGATIVE

## 2014-04-05 LAB — APTT: APTT: 27 s (ref 24–37)

## 2014-04-05 NOTE — Pre-Procedure Instructions (Signed)
EKG/CXR 07-06-13 Epic

## 2014-04-05 NOTE — Pre-Procedure Instructions (Signed)
04-05-14 1510 Your Pt has screened with an elevated risk for obstructive sleep apnea using the Stop-Bang tool during a presurgical  Visit. A score of four or greater is an elevated risk.

## 2014-04-05 NOTE — Patient Instructions (Addendum)
Shane Haney  04/05/2014   Your procedure is scheduled on:  6-29 -2015  Enter through [facility] and follow signs to [facility]. Arrive at    0730    AM.  Call this number if you have problems the morning of surgery: [phone]  Or Presurgical Testing [phone](Shane Haney) For Living Will and/or Health Care Power Attorney Forms: please provide copy for your medical record,may bring AM of surgery(Forms should be already notarized -we do not provide this service).(04-05-14  No information preferred today).     Do not eat food:After Midnight.    Take these medicines the morning of surgery with A SIP OF WATER: Gabapentin. Tramadol.   Do not wear jewelry, make-up or nail polish.  Do not wear lotions, powders, or perfumes. You may wear deodorant.  Do not shave 48 hours(2 days) prior to first CHG shower(legs and under arms).(Shaving face and neck okay.)  Do not bring valuables to the hospital.(Hospital is not responsible for lost valuables).  Contacts, dentures or removable bridgework, body piercing, hair pins may not be worn into surgery.  Leave suitcase in the car. After surgery it may be brought to your room.  For patients admitted to the hospital, checkout time is 11:00 AM the day of discharge.(Restricted visitors-Any Persons displaying flu-like symptoms or illness).    Patients discharged the day of surgery will not be allowed to drive home. Must have responsible person with you x 24 hours once discharged.  Name and phone number of your driver: shivaan, tierno [phone] cell  Special Instructions: CHG(Chlorhedine 4%-"Hibiclens","Betasept","Aplicare") Shower Use Special Wash: see special instructions.(avoid face and genitals)   Please read over the following fact sheets that you were given: MRSA Information, Blood Transfusion fact sheet, Incentive Spirometry Instruction.  Remember : Type/Screen "Blue armbands" - may not be removed once  applied(would result in being retested AM of surgery, if removed).  Failure to follow these instructions may result in Cancellation of your surgery.   _______________    [facility] - Preparing for Surgery Before surgery, you can play an important role.  Because skin is not sterile, your skin needs to be as free of germs as possible.  You can reduce the number of germs on your skin by washing with CHG (chlorahexidine gluconate) soap before surgery.  CHG is an antiseptic cleaner which kills germs and bonds with the skin to continue killing germs even after washing. Please DO NOT use if you have an allergy to CHG or antibacterial soaps.  If your skin becomes reddened/irritated stop using the CHG and inform your nurse when you arrive at Short Stay. Do not shave (including legs and underarms) for at least 48 hours prior to the first CHG shower.  You may shave your face/neck. Please follow these instructions carefully:  1.  Shower with CHG Soap the night before surgery and the  morning of Surgery.  2.  If you choose to wash your hair, wash your hair first as usual with your  normal  shampoo.  3.  After you shampoo, rinse your hair and body thoroughly to remove the  shampoo.                           4.  Use CHG as you would any other liquid soap.  You can apply chg directly  to the skin and wash  Gently with a scrungie or clean washcloth.  5.  Apply the CHG Soap to your body ONLY FROM THE NECK DOWN.   Do not use on face/ open                           Wound or open sores. Avoid contact with eyes, ears mouth and genitals (private parts).                       Wash face,  Genitals (private parts) with your normal soap.             6.  Wash thoroughly, paying special attention to the area where your surgery  will be performed.  7.  Thoroughly rinse your body with warm water from the neck down.  8.  DO NOT shower/wash with your normal soap after using and rinsing off  the CHG Soap.                 9.  Pat yourself dry with a clean towel.            10.  Wear clean pajamas.            11.  Place clean sheets on your bed the night of your first shower and do not  sleep with pets. Day of Surgery : Do not apply any lotions/deodorants the morning of surgery.  Please wear clean clothes to the hospital/surgery center.  FAILURE TO FOLLOW THESE INSTRUCTIONS MAY RESULT IN THE CANCELLATION OF YOUR SURGERY PATIENT SIGNATURE_________________________________  NURSE SIGNATURE__________________________________  ________________________________________________________________________   Shane Haney  An incentive spirometer is a tool that can help keep your lungs clear and active. This tool measures how well you are filling your lungs with each breath. Taking long deep breaths may help reverse or decrease the chance of developing breathing (pulmonary) problems (especially infection) following:  A long period of time when you are unable to move or be active. BEFORE THE PROCEDURE   If the spirometer includes an indicator to show your best effort, your nurse or respiratory therapist will set it to a desired goal.  If possible, sit up straight or lean slightly forward. Try not to slouch.  Hold the incentive spirometer in an upright position. INSTRUCTIONS FOR USE  1. Sit on the edge of your bed if possible, or sit up as far as you can in bed or on a chair. 2. Hold the incentive spirometer in an upright position. 3. Breathe out normally. 4. Place the mouthpiece in your mouth and seal your lips tightly around it. 5. Breathe in slowly and as deeply as possible, raising the piston or the ball toward the top of the column. 6. Hold your breath for 3-5 seconds or for as long as possible. Allow the piston or ball to fall to the bottom of the column. 7. Remove the mouthpiece from your mouth and breathe out normally. 8. Rest for a few seconds and repeat Steps 1 through 7 at least 10 times  every 1-2 hours when you are awake. Take your time and take a few normal breaths between deep breaths. 9. The spirometer may include an indicator to show your best effort. Use the indicator as a goal to work toward during each repetition. 10. After each set of 10 deep breaths, practice coughing to be sure your lungs are clear. If you have an incision (the cut made at the time of  surgery), support your incision when coughing by placing a pillow or rolled up towels firmly against it. Once you are able to get out of bed, walk around indoors and cough well. You may stop using the incentive spirometer when instructed by your caregiver.  RISKS AND COMPLICATIONS  Take your time so you do not get dizzy or light-headed.  If you are in pain, you may need to take or ask for pain medication before doing incentive spirometry. It is harder to take a deep breath if you are having pain. AFTER USE  Rest and breathe slowly and easily.  It can be helpful to keep track of a log of your progress. Your caregiver can provide you with a simple table to help with this. If you are using the spirometer at home, follow these instructions: [place] IF:   You are having difficultly using the spirometer.  You have trouble using the spirometer as often as instructed.  Your pain medication is not giving enough relief while using the spirometer.  You develop fever of 100.5 F (38.1 C) or higher. SEEK IMMEDIATE MEDICAL CARE IF:   You cough up bloody sputum that had not been present before.  You develop fever of 102 F (38.9 C) or greater.  You develop worsening pain at or near the incision site. MAKE SURE YOU:   Understand these instructions.  Will watch your condition.  Will get help right away if you are not doing well or get worse. Document Released: 02/10/2007 Document Revised: 12/23/2011 Document Reviewed: 04/13/2007 Exit[employer] Patient Information 2014 [employer],  [place].   ________________________________________________________________________  WHAT IS A BLOOD TRANSFUSION? Blood Transfusion Information  A transfusion is the replacement of blood or some of its parts. Blood is made up of multiple cells which provide different functions.  Red blood cells carry oxygen and are used for blood loss replacement.  Cleatus Gabriel blood cells fight against infection.  Platelets control bleeding.  Plasma helps clot blood.  Other blood products are available for specialized needs, such as hemophilia or other clotting disorders. BEFORE THE TRANSFUSION  Who gives blood for transfusions?   Healthy volunteers who are fully evaluated to make sure their blood is safe. This is blood bank blood. Transfusion therapy is the safest it has ever been in the practice of medicine. Before blood is taken from a donor, a complete history is taken to make sure that person has no history of diseases nor engages in risky social behavior (examples are intravenous drug use or sexual activity with multiple partners). The donor's travel history is screened to minimize risk of transmitting infections, such as malaria. The donated blood is tested for signs of infectious diseases, such as HIV and hepatitis. The blood is then tested to be sure it is compatible with you in order to minimize the chance of a transfusion reaction. If you or a relative donates blood, this is often done in anticipation of surgery and is not appropriate for emergency situations. It takes many days to process the donated blood. RISKS AND COMPLICATIONS Although transfusion therapy is very safe and saves many lives, the main dangers of transfusion include:   Getting an infectious disease.  Developing a transfusion reaction. This is an allergic reaction to something in the blood you were given. Every precaution is taken to prevent this. The decision to have a blood transfusion has been considered carefully by your caregiver  before blood is given. Blood is not given unless the benefits outweigh the risks. AFTER THE  TRANSFUSION  Right after receiving a blood transfusion, you will usually feel much better and more energetic. This is especially true if your red blood cells have gotten low (anemic). The transfusion raises the level of the red blood cells which carry oxygen, and this usually causes an energy increase.  The nurse administering the transfusion will monitor you carefully for complications. HOME CARE INSTRUCTIONS  No special instructions are needed after a transfusion. You may find your energy is better. Speak with your caregiver about any limitations on activity for underlying diseases you may have. SEEK MEDICAL CARE IF:   Your condition is not improving after your transfusion.  You develop redness or irritation at the intravenous (IV) site. SEEK IMMEDIATE MEDICAL CARE IF:  Any of the following symptoms occur over the next 12 hours:  Shaking chills.  You have a temperature by mouth above 102 F (38.9 C), not controlled by medicine.  Chest, back, or muscle pain.  People around you feel you are not acting correctly or are confused.  Shortness of breath or difficulty breathing.  Dizziness and fainting.  You get a rash or develop hives.  You have a decrease in urine output.  Your urine turns a dark color or changes to pink, red, or brown. Any of the following symptoms occur over the next 10 days:  You have a temperature by mouth above 102 F (38.9 C), not controlled by medicine.  Shortness of breath.  Weakness after normal activity.  The Roel Douthat part of the eye turns yellow (jaundice).  You have a decrease in the amount of urine or are urinating less often.  Your urine turns a dark color or changes to pink, red, or brown. Document Released: 09/27/2000 Document Revised: 12/23/2011 Document Reviewed: 05/16/2008 [facility] Patient Information 2014 [place],  [place].  _______________________________________________________________________

## 2014-04-10 MED ORDER — DEXTROSE 5 % IV SOLN
3.0000 g | INTRAVENOUS | Status: AC
  Administered 2014-04-11: 3 g via INTRAVENOUS
  Filled 2014-04-10 (×2): qty 3000

## 2014-04-11 ENCOUNTER — Encounter (HOSPITAL_COMMUNITY)
Admission: RE | Disposition: A | Discharge status: Home Health | Payer: Self-pay | Source: Ambulatory Visit | Attending: Orthopedic Surgery

## 2014-04-11 ENCOUNTER — Encounter (HOSPITAL_COMMUNITY): Payer: Self-pay | Admitting: *Deleted

## 2014-04-11 ENCOUNTER — Encounter (HOSPITAL_COMMUNITY): Payer: BC Managed Care – PPO | Admitting: Registered Nurse

## 2014-04-11 ENCOUNTER — Inpatient Hospital Stay (HOSPITAL_COMMUNITY)
Admission: RE | Admit: 2014-04-11 | Discharge: 2014-04-12 | DRG: 470 | Disposition: A | Discharge status: Home Health | Payer: BC Managed Care – PPO | Source: Ambulatory Visit | Attending: Orthopedic Surgery | Admitting: Orthopedic Surgery

## 2014-04-11 ENCOUNTER — Ambulatory Visit (HOSPITAL_COMMUNITY): Payer: BC Managed Care – PPO | Admitting: Registered Nurse

## 2014-04-11 DIAGNOSIS — Z01812 Encounter for preprocedural laboratory examination: Secondary | ICD-10-CM

## 2014-04-11 DIAGNOSIS — Z96659 Presence of unspecified artificial knee joint: Secondary | ICD-10-CM

## 2014-04-11 DIAGNOSIS — E669 Obesity, unspecified: Secondary | ICD-10-CM | POA: Diagnosis present

## 2014-04-11 DIAGNOSIS — Z96652 Presence of left artificial knee joint: Secondary | ICD-10-CM

## 2014-04-11 DIAGNOSIS — M1710 Unilateral primary osteoarthritis, unspecified knee: Principal | ICD-10-CM | POA: Diagnosis present

## 2014-04-11 DIAGNOSIS — M6580 Other synovitis and tenosynovitis, unspecified site: Secondary | ICD-10-CM | POA: Diagnosis present

## 2014-04-11 DIAGNOSIS — I10 Essential (primary) hypertension: Secondary | ICD-10-CM | POA: Diagnosis present

## 2014-04-11 DIAGNOSIS — E785 Hyperlipidemia, unspecified: Secondary | ICD-10-CM | POA: Diagnosis present

## 2014-04-11 DIAGNOSIS — Z6841 Body Mass Index (BMI) 40.0 and over, adult: Secondary | ICD-10-CM

## 2014-04-11 HISTORY — PX: TOTAL KNEE ARTHROPLASTY: SHX125

## 2014-04-11 LAB — TYPE AND SCREEN
ABO/RH(D): O NEG
Antibody Screen: NEGATIVE

## 2014-04-11 LAB — ABO/RH: ABO/RH(D): O NEG

## 2014-04-11 SURGERY — ARTHROPLASTY, KNEE, TOTAL
Anesthesia: Spinal | Site: Knee | Laterality: Left

## 2014-04-11 MED ORDER — CHLORHEXIDINE GLUCONATE 4 % EX LIQD: 60.0000 mL | Freq: Once | CUTANEOUS | Status: DC

## 2014-04-11 MED ORDER — PROPOFOL 10 MG/ML IV BOLUS
INTRAVENOUS | Status: AC
  Filled 2014-04-11: qty 20

## 2014-04-11 MED ORDER — 0.9 % SODIUM CHLORIDE (POUR BTL) OPTIME
TOPICAL | Status: DC | PRN
  Administered 2014-04-11: 1000 mL

## 2014-04-11 MED ORDER — MAGNESIUM CITRATE PO SOLN: 1.0000 | Freq: Once | ORAL | Status: AC | PRN

## 2014-04-11 MED ORDER — MEPERIDINE HCL 50 MG/ML IJ SOLN: 6.2500 mg | INTRAMUSCULAR | Status: DC | PRN

## 2014-04-11 MED ORDER — BISACODYL 10 MG RE SUPP: 10.0000 mg | Freq: Every day | RECTAL | Status: DC | PRN

## 2014-04-11 MED ORDER — ALUM & MAG HYDROXIDE-SIMETH 200-200-20 MG/5ML PO SUSP: 30.0000 mL | ORAL | Status: DC | PRN

## 2014-04-11 MED ORDER — PHENOL 1.4 % MT LIQD
1.0000 | OROMUCOSAL | Status: DC | PRN
  Filled 2014-04-11: qty 177

## 2014-04-11 MED ORDER — LACTATED RINGERS IV SOLN
INTRAVENOUS | Status: DC
  Administered 2014-04-11: 1000 mL via INTRAVENOUS

## 2014-04-11 MED ORDER — HYDROMORPHONE HCL PF 1 MG/ML IJ SOLN: 0.2500 mg | INTRAMUSCULAR | Status: DC | PRN

## 2014-04-11 MED ORDER — DEXAMETHASONE SODIUM PHOSPHATE 10 MG/ML IJ SOLN
10.0000 mg | Freq: Once | INTRAMUSCULAR | Status: AC
  Administered 2014-04-11: 10 mg via INTRAVENOUS

## 2014-04-11 MED ORDER — POLYETHYLENE GLYCOL 3350 17 G PO PACK
17.0000 g | PACK | Freq: Two times a day (BID) | ORAL | Status: DC
  Administered 2014-04-11 – 2014-04-12 (×2): 17 g via ORAL

## 2014-04-11 MED ORDER — FENTANYL CITRATE 0.05 MG/ML IJ SOLN
INTRAMUSCULAR | Status: AC
  Filled 2014-04-11: qty 2

## 2014-04-11 MED ORDER — POTASSIUM CHLORIDE 2 MEQ/ML IV SOLN
INTRAVENOUS | Status: DC
  Administered 2014-04-11 – 2014-04-12 (×2): via INTRAVENOUS
  Filled 2014-04-11 (×4): qty 1000

## 2014-04-11 MED ORDER — SODIUM CHLORIDE 0.9 % IR SOLN
Status: DC | PRN
  Administered 2014-04-11: 1000 mL

## 2014-04-11 MED ORDER — ACETAMINOPHEN 10 MG/ML IV SOLN
1000.0000 mg | Freq: Once | INTRAVENOUS | Status: DC
  Filled 2014-04-11: qty 100

## 2014-04-11 MED ORDER — DIPHENHYDRAMINE HCL 25 MG PO CAPS: 25.0000 mg | ORAL_CAPSULE | Freq: Four times a day (QID) | ORAL | Status: DC | PRN

## 2014-04-11 MED ORDER — ASPIRIN EC 325 MG PO TBEC
325.0000 mg | DELAYED_RELEASE_TABLET | Freq: Two times a day (BID) | ORAL | Status: DC
  Administered 2014-04-12: 325 mg via ORAL
  Filled 2014-04-11 (×3): qty 1

## 2014-04-11 MED ORDER — DOCUSATE SODIUM 100 MG PO CAPS
100.0000 mg | ORAL_CAPSULE | Freq: Two times a day (BID) | ORAL | Status: DC
  Administered 2014-04-11 – 2014-04-12 (×2): 100 mg via ORAL

## 2014-04-11 MED ORDER — MIDAZOLAM HCL 2 MG/2ML IJ SOLN
INTRAMUSCULAR | Status: AC
  Filled 2014-04-11: qty 2

## 2014-04-11 MED ORDER — HYDROMORPHONE HCL PF 1 MG/ML IJ SOLN
0.5000 mg | INTRAMUSCULAR | Status: DC | PRN
  Administered 2014-04-11: 0.5 mg via INTRAVENOUS
  Filled 2014-04-11: qty 1

## 2014-04-11 MED ORDER — METHOCARBAMOL 500 MG PO TABS
500.0000 mg | ORAL_TABLET | Freq: Four times a day (QID) | ORAL | Status: DC | PRN
  Administered 2014-04-12 (×2): 500 mg via ORAL
  Filled 2014-04-11 (×2): qty 1

## 2014-04-11 MED ORDER — PROMETHAZINE HCL 25 MG/ML IJ SOLN: 6.2500 mg | INTRAMUSCULAR | Status: DC | PRN

## 2014-04-11 MED ORDER — DEXAMETHASONE SODIUM PHOSPHATE 10 MG/ML IJ SOLN
INTRAMUSCULAR | Status: AC
  Filled 2014-04-11: qty 1

## 2014-04-11 MED ORDER — DEXAMETHASONE SODIUM PHOSPHATE 10 MG/ML IJ SOLN
10.0000 mg | Freq: Once | INTRAMUSCULAR | Status: AC
  Administered 2014-04-12: 10 mg via INTRAVENOUS
  Filled 2014-04-11: qty 1

## 2014-04-11 MED ORDER — ACETAMINOPHEN 10 MG/ML IV SOLN
INTRAVENOUS | Status: DC | PRN
  Administered 2014-04-11: 1000 mg via INTRAVENOUS

## 2014-04-11 MED ORDER — CEFAZOLIN SODIUM-DEXTROSE 2-3 GM-% IV SOLR
2.0000 g | Freq: Four times a day (QID) | INTRAVENOUS | Status: AC
  Administered 2014-04-11 (×2): 2 g via INTRAVENOUS
  Filled 2014-04-11 (×2): qty 50

## 2014-04-11 MED ORDER — SODIUM CHLORIDE 0.9 % IJ SOLN
INTRAMUSCULAR | Status: AC
  Filled 2014-04-11: qty 10

## 2014-04-11 MED ORDER — BUPIVACAINE LIPOSOME 1.3 % IJ SUSP
20.0000 mL | Freq: Once | INTRAMUSCULAR | Status: AC
  Administered 2014-04-11: 20 mL
  Filled 2014-04-11: qty 20

## 2014-04-11 MED ORDER — KETOROLAC TROMETHAMINE 30 MG/ML IJ SOLN
INTRAMUSCULAR | Status: DC | PRN
  Administered 2014-04-11: 30 mg via INTRAVENOUS

## 2014-04-11 MED ORDER — PROPOFOL INFUSION 10 MG/ML OPTIME
INTRAVENOUS | Status: DC | PRN
  Administered 2014-04-11: 100 ug/kg/min via INTRAVENOUS

## 2014-04-11 MED ORDER — BUPIVACAINE-EPINEPHRINE (PF) 0.25% -1:200000 IJ SOLN
INTRAMUSCULAR | Status: AC
  Filled 2014-04-11: qty 30

## 2014-04-11 MED ORDER — HYDROCODONE-ACETAMINOPHEN 7.5-325 MG PO TABS
1.0000 | ORAL_TABLET | ORAL | Status: DC
  Administered 2014-04-11: 1 via ORAL
  Administered 2014-04-11 – 2014-04-12 (×5): 2 via ORAL
  Filled 2014-04-11 (×3): qty 2
  Filled 2014-04-11: qty 1
  Filled 2014-04-11 (×2): qty 2

## 2014-04-11 MED ORDER — BUPIVACAINE-EPINEPHRINE (PF) 0.25% -1:200000 IJ SOLN
INTRAMUSCULAR | Status: DC | PRN
  Administered 2014-04-11: 30 mL

## 2014-04-11 MED ORDER — FENTANYL CITRATE 0.05 MG/ML IJ SOLN
INTRAMUSCULAR | Status: DC | PRN
Start: 2014-04-11 — End: 2014-04-11
  Administered 2014-04-11: 100 ug via INTRAVENOUS

## 2014-04-11 MED ORDER — MENTHOL 3 MG MT LOZG
1.0000 | LOZENGE | OROMUCOSAL | Status: DC | PRN
  Filled 2014-04-11: qty 9

## 2014-04-11 MED ORDER — MIDAZOLAM HCL 5 MG/5ML IJ SOLN
INTRAMUSCULAR | Status: DC | PRN
  Administered 2014-04-11 (×2): 2 mg via INTRAVENOUS

## 2014-04-11 MED ORDER — LACTATED RINGERS IV SOLN
INTRAVENOUS | Status: DC | PRN
  Administered 2014-04-11 (×4): via INTRAVENOUS

## 2014-04-11 MED ORDER — DEXTROSE 5 % IV SOLN
500.0000 mg | Freq: Four times a day (QID) | INTRAVENOUS | Status: DC | PRN
  Administered 2014-04-11 (×2): 500 mg via INTRAVENOUS
  Filled 2014-04-11 (×2): qty 5

## 2014-04-11 MED ORDER — ONDANSETRON HCL 4 MG/2ML IJ SOLN: 4.0000 mg | Freq: Four times a day (QID) | INTRAMUSCULAR | Status: DC | PRN

## 2014-04-11 MED ORDER — FERROUS SULFATE 325 (65 FE) MG PO TABS
325.0000 mg | ORAL_TABLET | Freq: Three times a day (TID) | ORAL | Status: DC
  Administered 2014-04-11 – 2014-04-12 (×3): 325 mg via ORAL
  Filled 2014-04-11 (×5): qty 1

## 2014-04-11 MED ORDER — METOCLOPRAMIDE HCL 5 MG/ML IJ SOLN: 5.0000 mg | Freq: Three times a day (TID) | INTRAMUSCULAR | Status: DC | PRN

## 2014-04-11 MED ORDER — PROPOFOL 10 MG/ML IV BOLUS
INTRAVENOUS | Status: DC | PRN
  Administered 2014-04-11 (×2): 50 mg via INTRAVENOUS

## 2014-04-11 MED ORDER — GABAPENTIN 300 MG PO CAPS
300.0000 mg | ORAL_CAPSULE | Freq: Three times a day (TID) | ORAL | Status: DC
  Administered 2014-04-11 – 2014-04-12 (×3): 300 mg via ORAL
  Filled 2014-04-11 (×6): qty 1

## 2014-04-11 MED ORDER — GLYCOPYRROLATE 0.2 MG/ML IJ SOLN
INTRAMUSCULAR | Status: DC | PRN
  Administered 2014-04-11: 0.2 mg via INTRAVENOUS

## 2014-04-11 MED ORDER — TRANEXAMIC ACID 100 MG/ML IV SOLN
1000.0000 mg | Freq: Once | INTRAVENOUS | Status: AC
  Administered 2014-04-11: 1000 mg via INTRAVENOUS
  Filled 2014-04-11: qty 10

## 2014-04-11 MED ORDER — SODIUM CHLORIDE 0.9 % IJ SOLN
INTRAMUSCULAR | Status: DC | PRN
  Administered 2014-04-11: 9 mL via INTRAVENOUS

## 2014-04-11 MED ORDER — LACTATED RINGERS IV SOLN: INTRAVENOUS | Status: DC

## 2014-04-11 MED ORDER — ONDANSETRON HCL 4 MG PO TABS: 4.0000 mg | ORAL_TABLET | Freq: Four times a day (QID) | ORAL | Status: DC | PRN

## 2014-04-11 MED ORDER — METOCLOPRAMIDE HCL 10 MG PO TABS: 5.0000 mg | ORAL_TABLET | Freq: Three times a day (TID) | ORAL | Status: DC | PRN

## 2014-04-11 MED ORDER — KETOROLAC TROMETHAMINE 30 MG/ML IJ SOLN
INTRAMUSCULAR | Status: AC
  Filled 2014-04-11: qty 1

## 2014-04-11 MED ORDER — CELECOXIB 200 MG PO CAPS
200.0000 mg | ORAL_CAPSULE | Freq: Two times a day (BID) | ORAL | Status: DC
  Administered 2014-04-11 – 2014-04-12 (×2): 200 mg via ORAL
  Filled 2014-04-11 (×3): qty 1

## 2014-04-11 SURGICAL SUPPLY — 52 items
BAG ZIPLOCK 12X15 (MISCELLANEOUS) IMPLANT
BANDAGE ELASTIC 6 VELCRO ST LF (GAUZE/BANDAGES/DRESSINGS) ×3 IMPLANT
BANDAGE ESMARK 6X9 LF (GAUZE/BANDAGES/DRESSINGS) ×1 IMPLANT
BLADE SAW SGTL 13.0X1.19X90.0M (BLADE) ×3 IMPLANT
BNDG ESMARK 6X9 LF (GAUZE/BANDAGES/DRESSINGS) ×3
BOWL SMART MIX CTS (DISPOSABLE) ×3 IMPLANT
CAP KNEE ATTUNE RP ×3 IMPLANT
CEMENT HV SMART SET (Cement) ×6 IMPLANT
CUFF TOURN SGL QUICK 34 (TOURNIQUET CUFF) ×3
CUFF TRNQT CYL 34X4X40X1 (TOURNIQUET CUFF) ×1 IMPLANT
DERMABOND ADVANCED (GAUZE/BANDAGES/DRESSINGS) ×2
DERMABOND ADVANCED .7 DNX12 (GAUZE/BANDAGES/DRESSINGS) ×1 IMPLANT
DRAPE EXTREMITY T 121X128X90 (DRAPE) ×3 IMPLANT
DRAPE POUCH INSTRU U-SHP 10X18 (DRAPES) ×3 IMPLANT
DRAPE U-SHAPE 47X51 STRL (DRAPES) ×3 IMPLANT
DRSG AQUACEL AG ADV 3.5X10 (GAUZE/BANDAGES/DRESSINGS) ×3 IMPLANT
DRSG TEGADERM 4X4.75 (GAUZE/BANDAGES/DRESSINGS) IMPLANT
DURAPREP 26ML APPLICATOR (WOUND CARE) ×6 IMPLANT
ELECT REM PT RETURN 9FT ADLT (ELECTROSURGICAL) ×3
ELECTRODE REM PT RTRN 9FT ADLT (ELECTROSURGICAL) ×1 IMPLANT
FACESHIELD WRAPAROUND (MASK) ×15 IMPLANT
GAUZE SPONGE 2X2 8PLY STRL LF (GAUZE/BANDAGES/DRESSINGS) IMPLANT
GLOVE BIOGEL PI IND STRL 7.5 (GLOVE) ×2 IMPLANT
GLOVE BIOGEL PI IND STRL 8 (GLOVE) ×1 IMPLANT
GLOVE BIOGEL PI INDICATOR 7.5 (GLOVE) ×4
GLOVE BIOGEL PI INDICATOR 8 (GLOVE) ×2
GLOVE ECLIPSE 8.0 STRL XLNG CF (GLOVE) ×3 IMPLANT
GLOVE ORTHO TXT STRL SZ7.5 (GLOVE) ×9 IMPLANT
GOWN SPEC L3 XXLG W/TWL (GOWN DISPOSABLE) ×3 IMPLANT
GOWN STRL REUS W/TWL LRG LVL3 (GOWN DISPOSABLE) ×3 IMPLANT
HANDPIECE INTERPULSE COAX TIP (DISPOSABLE) ×2
KIT BASIN OR (CUSTOM PROCEDURE TRAY) ×3 IMPLANT
MANIFOLD NEPTUNE II (INSTRUMENTS) ×3 IMPLANT
NDL SAFETY ECLIPSE 18X1.5 (NEEDLE) ×1 IMPLANT
NEEDLE HYPO 18GX1.5 SHARP (NEEDLE) ×2
PACK TOTAL JOINT (CUSTOM PROCEDURE TRAY) ×3 IMPLANT
POSITIONER SURGICAL ARM (MISCELLANEOUS) ×3 IMPLANT
SET HNDPC FAN SPRY TIP SCT (DISPOSABLE) ×1 IMPLANT
SET PAD KNEE POSITIONER (MISCELLANEOUS) ×3 IMPLANT
SPONGE GAUZE 2X2 STER 10/PKG (GAUZE/BANDAGES/DRESSINGS)
SUCTION FRAZIER 12FR DISP (SUCTIONS) ×3 IMPLANT
SUT MNCRL AB 4-0 PS2 18 (SUTURE) ×3 IMPLANT
SUT VIC AB 1 CT1 36 (SUTURE) ×3 IMPLANT
SUT VIC AB 2-0 CT1 27 (SUTURE) ×6
SUT VIC AB 2-0 CT1 TAPERPNT 27 (SUTURE) ×3 IMPLANT
SUT VLOC 180 0 24IN GS25 (SUTURE) ×3 IMPLANT
SYRINGE 60CC LL (MISCELLANEOUS) ×3 IMPLANT
TOWEL OR 17X26 10 PK STRL BLUE (TOWEL DISPOSABLE) ×3 IMPLANT
TOWEL OR NON WOVEN STRL DISP B (DISPOSABLE) IMPLANT
TRAY FOLEY CATH 14FRSI W/METER (CATHETERS) ×3 IMPLANT
WATER STERILE IRR 1500ML POUR (IV SOLUTION) ×3 IMPLANT
WRAP KNEE MAXI GEL POST OP (GAUZE/BANDAGES/DRESSINGS) ×3 IMPLANT

## 2014-04-11 NOTE — Anesthesia Preprocedure Evaluation (Signed)
Anesthesia Evaluation  Patient identified by MRN, date of birth, ID band Patient awake    Reviewed: Allergy & Precautions, H&P , NPO status , Patient's Chart, lab work & pertinent test results  Airway Mallampati: II TM Distance: >3 FB Neck ROM: Full    Dental no notable dental hx.    Pulmonary neg pulmonary ROS,  breath sounds clear to auscultation  Pulmonary exam normal       Cardiovascular hypertension, Pt. on medications negative cardio ROS  Rhythm:Regular Rate:Normal     Neuro/Psych negative neurological ROS  negative psych ROS   GI/Hepatic negative GI ROS, Neg liver ROS,   Endo/Other  negative endocrine ROS  Renal/GU negative Renal ROS  negative genitourinary   Musculoskeletal negative musculoskeletal ROS (+)   Abdominal   Peds negative pediatric ROS (+)  Hematology negative hematology ROS (+)   Anesthesia Other Findings   Reproductive/Obstetrics negative OB ROS                           Anesthesia Physical Anesthesia Plan  ASA: II  Anesthesia Plan: Spinal   Post-op Pain Management:    Induction:   Airway Management Planned: Simple Face Mask  Additional Equipment:   Intra-op Plan:   Post-operative Plan:   Informed Consent: I have reviewed the patients History and Physical, chart, labs and discussed the procedure including the risks, benefits and alternatives for the proposed anesthesia with the patient or authorized representative who has indicated his/her understanding and acceptance.   Dental advisory given  Plan Discussed with: CRNA  Anesthesia Plan Comments:         Anesthesia Quick Evaluation

## 2014-04-11 NOTE — Op Note (Signed)
NAME:  Shane Haney [facility]                      MEDICAL RECORD NO.:  [id number]                             FACILITY:  [facility]      PHYSICIAN:  Pietro Cassis. Alvan Dame, M.D.  DATE OF BIRTH:  1960/10/26      DATE OF PROCEDURE:  04/11/2014                                     OPERATIVE REPORT         PREOPERATIVE DIAGNOSIS:  Left knee osteoarthritis.      POSTOPERATIVE DIAGNOSIS:  Left knee osteoarthritis.      FINDINGS:  The patient was noted to have complete loss of cartilage and   bone-on-bone arthritis with associated osteophytes in all three compartments of   the knee with a significant synovitis and associated effusion.      PROCEDURE:  Left total knee replacement.      COMPONENTS USED:  DePuy Attune rotating platform posterior stabilized knee   system, a size 8 PS femur, 8 tibia, size 5 mm PS AOX insert, and 41 "anatomic" AOX patellar   button.      SURGEON:  Pietro Cassis. Alvan Dame, M.D.      ASSISTANT:  Danae Orleans, PA-C.      ANESTHESIA:  Spinal.      SPECIMENS:  None.      COMPLICATION:  None.      DRAINS:  None  EBL: <200cc      TOURNIQUET TIME:   Total Tourniquet Time Documented: Thigh (Left) - 63 minutes Total: Thigh (Left) - 63 minutes      The patient was stable to the recovery room.      INDICATION FOR PROCEDURE:  Shane Haney is a 53 y.o. male patient of   mine.  The patient had been seen, evaluated, and treated conservatively in the   office with medication, activity modification, and injections.  The patient had   radiographic changes of bone-on-bone arthritis with endplate sclerosis and osteophytes noted.      The patient failed conservative measures including medication, injections, and activity modification, and at this point was ready for more definitive measures.   Based on the radiographic changes and failed conservative measures, the patient   decided to proceed with total knee replacement.  Risks of infection,   DVT, component failure, need for  revision surgery, postop course, and   expectations were all   discussed and reviewed.  Consent was obtained for benefit of pain   relief.      PROCEDURE IN DETAIL:  The patient was brought to the operative theater.   Once adequate anesthesia, preoperative antibiotics, 3 gm of Ancef administered, the patient was positioned supine with the left thigh tourniquet placed.  The  left lower extremity was prepped and draped in sterile fashion.  A time-   out was performed identifying the patient, planned procedure, and   extremity.      The left lower extremity was placed in the [facility] leg holder.  The leg was   exsanguinated, tourniquet elevated to 250 mmHg.  A midline incision was   made followed by median parapatellar arthrotomy.  Following initial   exposure, attention  was first directed to the patella.  Precut   measurement was noted to be 27 mm.  I resected down to 15 mm and used a   41 patellar button to restore patellar height as well as cover the cut   surface.      The lug holes were drilled and a trial button was placed to protect the   patella from retractors and saw blades.      At this point, attention was now directed to the femur.  The femoral   canal was opened with a drill, irrigated to try to prevent fat emboli.  An   intramedullary rod was passed at 5 degrees valgus, 9 mm of bone was   resected off the distal femur.  Following this resection, the tibia was   subluxated anteriorly.  Using the extramedullary guide, 2 mm of bone was resected off   the proximal medial tibia.  We confirmed the gap would be   stable medially and laterally with a size 5 insert as well as confirmed   the cut was perpendicular in the coronal plane, checking with an alignment rod.      Once this was done, I sized the femur to be a size 8 in the anterior-   posterior dimension, chose a standard component based on medial and   lateral dimension.  The size 8 cutting block was then pinned in    position anterior referenced using the sizing and tensioning device to set rotation.  The   anterior, posterior, and  chamfer cuts were made without difficulty nor   notching making certain that I was along the anterior cortex to help   with flexion gap stability.      The final box cut was made off the lateral aspect of distal femur.      At this point, the tibia was sized to be a size 8, the size 8 tray was   then pinned in position through the medial third of the tubercle,   drilled, and keel punched.  Trial reduction was now carried with a 8 femur,  8 tibia, a 5 then size 6 mm insert, and the 41 patella botton.  The knee was brought to   extension, full extension with good flexion stability with the patella   tracking through the trochlea without application of pressure.  Given   all these findings, the trial components removed.  Final components were   opened and cement was mixed.  The knee was irrigated with normal saline   solution and pulse lavage.  The synovial lining was   then injected with 20cc of Exparel, 30cc of 0.25% Marcaine with epinephrine and 1 cc of Toradol,   total of 61 cc.      The knee was irrigated.  Final implants were then cemented onto clean and   dried cut surfaces of bone with the knee brought to extension with a size 6   mm trial insert.      Once the cement had fully cured, the excess cement was removed   throughout the knee.  I confirmed I was satisfied with the range of   motion and stability, and the final size 6 mm PS AOXinsert was chosen.  It was   placed into the knee.      The tourniquet had been let down at 63 minutes.  No significant   hemostasis required.  The   extensor mechanism was then reapproximated using #1 Vicryl with the  knee   in flexion.  The   remaining wound was closed with 2-0 Vicryl and running 4-0 Monocryl.   The knee was cleaned, dried, dressed sterilely using Dermabond and   Aquacel dressing.  The patient was then   brought  to recovery room in stable condition, tolerating the procedure   well.   Please note that Physician Assistant, Danae Orleans, was present for the entirety of the case, and was utilized for pre-operative positioning, peri-operative retractor management, general facilitation of the procedure.  He was also utilized for primary wound closure at the end of the case.              Pietro Cassis Alvan Dame, M.D.    04/11/2014 1:10 PM

## 2014-04-11 NOTE — Anesthesia Postprocedure Evaluation (Signed)
  Anesthesia Post-op Note  Patient: Shane Haney  Procedure(s) Performed: Procedure(s) (LRB): LEFT TOTAL KNEE ARTHROPLASTY WITH HARDWARE REMOVAL (Left)  Patient Location: PACU  Anesthesia Type: Spinal  Level of Consciousness: awake and alert   Airway and Oxygen Therapy: Patient Spontanous Breathing  Post-op Pain: mild  Post-op Assessment: Post-op Vital signs reviewed, Patient's Cardiovascular Status Stable, Respiratory Function Stable, Patent Airway and No signs of Nausea or vomiting  Last Vitals:  Filed Vitals:   04/11/14 1700  BP: 138/87  Pulse: 81  Temp: 36.8 C  Resp: 16    Post-op Vital Signs: stable   Complications: No apparent anesthesia complications

## 2014-04-11 NOTE — Transfer of Care (Signed)
Immediate Anesthesia Transfer of Care Note  Patient: Shane Haney [facility]  Procedure(s) Performed: Procedure(s): LEFT TOTAL KNEE ARTHROPLASTY WITH HARDWARE REMOVAL (Left)  Patient Location: PACU  Anesthesia Type:Spinal  Level of Consciousness: awake, alert , oriented and patient cooperative  Airway & Oxygen Therapy: Patient Spontanous Breathing and Patient connected to face mask oxygen  Post-op Assessment: Report given to PACU RN and Post -op Vital signs reviewed and stable  Post vital signs: stable  Complications: No apparent anesthesia complications  T 11 spinal level

## 2014-04-11 NOTE — Interval H&P Note (Signed)
History and Physical Interval Note:  04/11/2014 9:41 AM  Shane Haney  has presented today for surgery, with the diagnosis of left knee osteoarthritis  The various methods of treatment have been discussed with the patient and family. After consideration of risks, benefits and other options for treatment, the patient has consented to  Procedure(s): LEFT TOTAL KNEE ARTHROPLASTY WITH HARDWARE REMOVAL (Left) as a surgical intervention .  The patient's history has been reviewed, patient examined, no change in status, stable for surgery.  I have reviewed the patient's chart and labs.  Questions were answered to the patient's satisfaction.     [name]

## 2014-04-12 LAB — CBC
HCT: 33.4 % — ABNORMAL LOW (ref 39.0–52.0)
Hemoglobin: 12 g/dL — ABNORMAL LOW (ref 13.0–17.0)
MCH: 32 pg (ref 26.0–34.0)
MCHC: 35.9 g/dL (ref 30.0–36.0)
MCV: 89.1 fL (ref 78.0–100.0)
PLATELETS: 238 10*3/uL (ref 150–400)
RBC: 3.75 MIL/uL — ABNORMAL LOW (ref 4.22–5.81)
RDW: 12.3 % (ref 11.5–15.5)
WBC: 12.7 10*3/uL — AB (ref 4.0–10.5)

## 2014-04-12 LAB — BASIC METABOLIC PANEL
BUN: 18 mg/dL (ref 6–23)
CHLORIDE: 97 meq/L (ref 96–112)
CO2: 23 mEq/L (ref 19–32)
Calcium: 8.5 mg/dL (ref 8.4–10.5)
Creatinine, Ser: 0.93 mg/dL (ref 0.50–1.35)
GFR calc non Af Amer: >90 mL/min (ref >90)
Glucose, Bld: 130 mg/dL — ABNORMAL HIGH (ref 70–99)
Potassium: 4.4 mEq/L (ref 3.7–5.3)
SODIUM: 132 meq/L — AB (ref 137–147)

## 2014-04-12 MED ORDER — HYDROCODONE-ACETAMINOPHEN 7.5-325 MG PO TABS: 1.0000 | ORAL_TABLET | ORAL | Status: DC

## 2014-04-12 MED ORDER — METHOCARBAMOL 500 MG PO TABS: 500.0000 mg | ORAL_TABLET | Freq: Four times a day (QID) | ORAL | Status: DC | PRN

## 2014-04-12 MED ORDER — POLYETHYLENE GLYCOL 3350 17 G PO PACK: 17.0000 g | PACK | Freq: Two times a day (BID) | ORAL | Status: DC

## 2014-04-12 MED ORDER — DSS 100 MG PO CAPS: 100.0000 mg | ORAL_CAPSULE | Freq: Two times a day (BID) | ORAL | Status: DC

## 2014-04-12 MED ORDER — ASPIRIN 325 MG PO TBEC
325.0000 mg | DELAYED_RELEASE_TABLET | Freq: Two times a day (BID) | ORAL | Status: AC
Start: 2014-04-12 — End: 2014-05-10

## 2014-04-12 MED ORDER — PREDNISONE (PAK) 10 MG PO TABS: ORAL_TABLET | Freq: Every day | ORAL | Status: DC

## 2014-04-12 MED ORDER — FERROUS SULFATE 325 (65 FE) MG PO TABS: 325.0000 mg | ORAL_TABLET | Freq: Three times a day (TID) | ORAL | Status: DC

## 2014-04-12 NOTE — Progress Notes (Signed)
[facility]  [facility] is providing the following services: RW and Commode  If patient discharges after hours, please call [phone].   Shane Haney 04/12/2014, 8:28 AM

## 2014-04-12 NOTE — Progress Notes (Addendum)
Physical Therapy Treatment Note   04/12/14 1400  PT Visit Information  Last PT Received On 04/12/14  Assistance Needed +1  History of Present Illness Pt is a 53 year old male s/p L TKA  PT Time Calculation  PT Start Time 1341  PT Stop Time 1357  PT Time Calculation (min) 16 min  Subjective Data  Subjective Pt ambulated again in hallway and practiced stairs with rail and crutch.  Spouse present and educated as well.  Reviewed HEP handout with pt and answered questions.  Pt feels ready for d/c home.  Precautions  Precautions Knee  Restrictions  Other Position/Activity Restrictions WBAT  Cognition  Arousal/Alertness Awake/alert  Behavior During Therapy WFL for tasks assessed/performed  Overall Cognitive Status Within Functional Limits for tasks assessed  Bed Mobility  General bed mobility comments pt up in recliner on arrival  Transfers  Overall transfer level Needs assistance  Equipment used Rolling walker (2 wheeled)  Transfers Sit to/from Stand  Sit to Stand Supervision  Ambulation/Gait  Ambulation/Gait assistance Supervision  Ambulation Distance (Feet) 280 Feet  Assistive device Rolling walker (2 wheeled)  Gait Pattern/deviations Step-through pattern;Antalgic;Trunk flexed  General Gait Details verbal cues for step length and posture  Stairs Yes  Stairs assistance Min guard  Stair Management Step to pattern;Forwards;With crutches;One rail Left  Number of Stairs 4 (x2)  General stair comments pt and spouse educated on safe stair technique, verbal cues for sequence and crutch placement, performed 4 steps twice  PT - End of Session  Activity Tolerance Patient tolerated treatment well  Patient left in chair;with call bell/phone within reach;with family/visitor present  PT - Assessment/Plan  PT Plan Current plan remains appropriate  PT Frequency 7X/week  Follow Up Recommendations Home health PT  PT equipment Rolling walker with 5" wheels  PT Goal Progression  Progress  towards PT goals Progressing toward goals  PT General Charges  $$ ACUTE PT VISIT 1 Procedure  PT Treatments  $Gait Training 8-22 mins   Carmelia Bake, PT, DPT 04/12/2014 Pager: [phone]

## 2014-04-12 NOTE — Evaluation (Signed)
Occupational Therapy Evaluation Patient Details Name: Shane Haney MRN: [id number] DOB: 01/08/1961 Today's Date: 04/12/2014    History of Present Illness Pt is a 53 year old male s/p L TKA   Clinical Impression   Pt doing well. All education completed with pt and spouse. Reviewed and practiced with LB dressing including socks, shoes and shorts. Issued elastic shoe laces to make tennis shoe use easier. Practiced 3in1 transfers and discussed tub safety and DME options as well as AE that is available if desired.     Follow Up Recommendations  No OT follow up;Supervision/Assistance - 24 hour    Equipment Recommendations  3 in 1 bedside comode (in room)    Recommendations for Other Services       Precautions / Restrictions Precautions Precautions: Knee Restrictions Weight Bearing Restrictions: No Other Position/Activity Restrictions: WBAT      Mobility Bed Mobility               General bed mobility comments: pt up in recliner on arrival  Transfers Overall transfer level: Needs assistance Equipment used: Rolling walker (2 wheeled) Transfers: Sit to/from Stand Sit to Stand: Supervision              Balance                                            ADL Overall ADL's : Needs assistance/impaired Eating/Feeding: Independent;Sitting   Grooming: Set up;Sitting   Upper Body Bathing: Set up;Sitting   Lower Body Bathing: Min guard;Sit to/from stand   Upper Body Dressing : Set up;Sitting   Lower Body Dressing: Min guard;Sit to/from stand Lower Body Dressing Details (indicate cue type and reason): able to doff and don L sock, able to start shorts over L LE, used shoe horn to don L shoe. Issued elastic laces Toilet Transfer: Min guard;Ambulation;RW;BSC   Toileting- Water quality scientist and Hygiene: Min guard;Sit to/from stand         General ADL Comments: Discussed tub transfer safety and sequence of stepping in and out of tub.  Discussed tubseat options versus bench but pt states he will likely sponge bathe a few days . Advised pt that [facility] can assess tub transfer also. Reviewed and practiced sequence for LB dressing and pt did well with donning shorts and socks without AE. Issued elastic laces to help make tennis shoe use easier initially. Educated on where to obtain AE including shoe horn if he desires. Needed min cues to not let go of walker prematurely before backing up to 3in1 fully.      Vision                     Perception     Praxis      Pertinent Vitals/Pain 5/10 L knee; reposition, ice, informed nursing     Hand Dominance     Extremity/Trunk Assessment Upper Extremity Assessment Upper Extremity Assessment: Overall WFL for tasks assessed          Communication Communication Communication: No difficulties   Cognition Arousal/Alertness: Awake/alert Behavior During Therapy: WFL for tasks assessed/performed Overall Cognitive Status: Within Functional Limits for tasks assessed                     General Comments       Exercises       Shoulder Instructions  Home Living Family/patient expects to be discharged to:: Private residence Living Arrangements: Spouse/significant other Available Help at Discharge: Family Type of Home: House Home Access: Level entry     [place]: Two level;1/2 bath on main level;Bed/bath upstairs Alternate Level Stairs-Number of Steps: 10-12 Alternate Level Stairs-Rails: Left Bathroom Shower/Tub: Teacher, early years/pre: Standard     Home Equipment: None   Additional Comments: pt reports he can stay on main level however wishes to practice stairs prior to d/c      Prior Functioning/Environment Level of Independence: Independent        Comments: PE teacher    OT Diagnosis:     OT Problem List:     OT Treatment/Interventions:      OT Goals(Current goals can be found in the care plan section) Acute Rehab OT  Goals Patient Stated Goal: home  OT Frequency:     Barriers to D/C:            Co-evaluation              End of Session Equipment Utilized During Treatment: Rolling walker  Activity Tolerance: Patient tolerated treatment well Patient left: in chair;with call bell/phone within reach;with family/visitor present   Time: 1010-1037 OT Time Calculation (min): 27 min Charges:  OT General Charges $OT Visit: 1 Procedure OT Evaluation $Initial OT Evaluation Tier I: 1 Procedure OT Treatments $Self Care/Home Management : 8-22 mins $Therapeutic Activity: 8-22 mins G-Codes:    Jules Schick [phone] 04/12/2014, 10:49 AM

## 2014-04-12 NOTE — Progress Notes (Signed)
Patient ID: Shane Haney, male   DOB: [birth year], 53 y.o.   MRN: [id number] Subjective: 1 Day Post-Op Procedure(s) (LRB): LEFT TOTAL KNEE ARTHROPLASTY WITH HARDWARE REMOVAL (Left)    Patient reports pain as moderate.  More of a challenge sleeping with regards to his back and left sided radicular complaints.  Otherwise no events, comfortable in chair this am already  Objective:   VITALS:   Filed Vitals:   04/12/14 0639  BP: 110/73  Pulse: 90  Temp: 98.2 F (36.8 C)  Resp: 18    Neurovascular intact Incision: dressing C/D/I, ice on knee this am  LABS  Recent Labs  04/12/14 0455  HGB 12.0*  HCT 33.4*  WBC 12.7*  PLT 238     Recent Labs  04/12/14 0455  NA 132*  K 4.4  BUN 18  CREATININE 0.93  GLUCOSE 130*    No results found for this basename: LABPT, INR,  in the last 72 hours   Assessment/Plan: 1 Day Post-Op Procedure(s) (LRB): LEFT TOTAL KNEE ARTHROPLASTY WITH HARDWARE REMOVAL (Left)   Advance diet Up with therapy Discharge home with home health today after therapy  Decadron this am then discharge with 12 day prednisone dosepak to help with radicular symptoms Reviewed instructions and goals

## 2014-04-12 NOTE — Evaluation (Signed)
Physical Therapy Evaluation Patient Details Name: Shane Haney MRN: [id number] DOB: 1961-07-14 Today's Date: 04/12/2014   History of Present Illness  Pt is a 53 year old male s/p L TKA  Clinical Impression  Pt is s/p L TKA resulting in the deficits listed below (see PT Problem List).  Pt will benefit from skilled PT to increase their independence and safety with mobility to allow discharge to the venue listed below.  Pt already up in recliner on arrival and performing mobility well POD #1.  Pt also performed exercises.  Pt plans to ambulate again this afternoon and practice stairs as he hopes to possibly d/c home later today.     Follow Up Recommendations Home health PT (will likely quickly progress)    Equipment Recommendations  Rolling walker with 5" wheels    Recommendations for Other Services       Precautions / Restrictions Precautions Precautions: Knee Restrictions Weight Bearing Restrictions: No Other Position/Activity Restrictions: WBAT      Mobility  Bed Mobility               General bed mobility comments: pt up in recliner on arrival  Transfers Overall transfer level: Needs assistance Equipment used: Rolling walker (2 wheeled) Transfers: Sit to/from Stand Sit to Stand: Supervision            Ambulation/Gait Ambulation/Gait assistance: Min guard;Supervision Ambulation Distance (Feet): 120 Feet Assistive device: Rolling walker (2 wheeled) Gait Pattern/deviations: Step-through pattern;Antalgic;Trunk flexed;Decreased step length - right     General Gait Details: verbal cues for sequence, RW distance, posture  Stairs            Wheelchair Mobility    Modified Rankin (Stroke Patients Only)       Balance                                             Pertinent Vitals/Pain Reports good pain control, activity to tolerance, ice packs applied    Home Living Family/patient expects to be discharged to:: Private  residence Living Arrangements: Spouse/significant other Available Help at Discharge: Family Type of Home: House Home Access: Level entry     Home Layout: Two level;1/2 bath on main level;Bed/bath upstairs Home Equipment: None Additional Comments: pt reports he can stay on main level however wishes to practice stairs prior to d/c    Prior Function Level of Independence: Independent         Comments: PE teacher     Hand Dominance        Extremity/Trunk Assessment   Upper Extremity Assessment: Overall WFL for tasks assessed           Lower Extremity Assessment: LLE deficits/detail   LLE Deficits / Details: good quad contraction, unable to perform SLR, L knee flexion 90*     Communication   Communication: No difficulties  Cognition Arousal/Alertness: Awake/alert Behavior During Therapy: WFL for tasks assessed/performed Overall Cognitive Status: Within Functional Limits for tasks assessed                      General Comments      Exercises Total Joint Exercises Ankle Circles/Pumps: AROM;Both;15 reps Quad Sets: AROM;15 reps;Both Towel Squeeze: AROM;Both;15 reps Short Arc Quad: AROM;15 reps;Left Heel Slides: AROM;Seated;Left;15 reps Hip ABduction/ADduction: AROM;Left;15 reps Straight Leg Raises: AAROM;Left;15 reps      Assessment/Plan  PT Assessment Patient needs continued PT services  PT Diagnosis Abnormality of gait;Acute pain   PT Problem List Decreased strength;Decreased range of motion;Decreased mobility;Pain  PT Treatment Interventions Functional mobility training;Patient/family education;Therapeutic activities;Therapeutic exercise;Stair training;Gait training;DME instruction   PT Goals (Current goals can be found in the Care Plan section) Acute Rehab PT Goals Patient Stated Goal: home PT Goal Formulation: With patient Time For Goal Achievement: 04/15/14 Potential to Achieve Goals: Good    Frequency 7X/week   Barriers to discharge         Co-evaluation               End of Session   Activity Tolerance: Patient tolerated treatment well Patient left: in chair;with call bell/phone within reach;with family/visitor present           Time: 0223-3612 PT Time Calculation (min): 24 min   Charges:   PT Evaluation $Initial PT Evaluation Tier I: 1 Procedure PT Treatments $Gait Training: 8-22 mins $Therapeutic Exercise: 8-22 mins   PT G Codes:          Amante Fomby,KATHrine E 04/12/2014, 12:24 PM Carmelia Bake, PT, DPT 04/12/2014 Pager: [phone]

## 2014-04-13 NOTE — Discharge Summary (Signed)
Physician Discharge Summary  Patient ID: JAYDRIAN CORPENING MRN: [id number] DOB/AGE: 1961-05-07 53 y.o.  Admit date: 04/11/2014 Discharge date: 04/12/2014   Procedures:  Procedure(s) (LRB): LEFT TOTAL KNEE ARTHROPLASTY WITH HARDWARE REMOVAL (Left)  Attending Physician:  Dr. Paralee Cancel   Admission Diagnoses:   Left knee OA / pain  Discharge Diagnoses:  Principal Problem:   S/P left TKA  Past Medical History  Diagnosis Date  . Hyperlipidemia   . H/O echocardiogram 2010    seen by Dr. Stanford Breed- told to return as needed   . Hypertension     Dr, Stanford Breed told pt. in 2010, ? extra beats.  . Dysrhythmia     "irregular at one time" (07/21/2013)  . Snores   . Osteoarthritis     "knees and wrists" (07/21/2013)  . Neuromuscular disorder     spinal cord compression, carpal tunnel - R hand  . Left 3 fingers remains with some tingling.    HPI: Shane FOUSE, 53 y.o. male, has a history of pain and functional disability in the left knee due to arthritis and has failed non-surgical conservative treatments for greater than 12 weeks to includeNSAID's and/or analgesics, corticosteriod injections, use of assistive devices and activity modification. Onset of symptoms was gradual, starting >10 years ago with gradually worsening course since that time. The patient noted prior procedures on the knee to include arthroscopy and ACL reconstruction on the left knee(s). Patient currently rates pain in the left knee(s) at 9 out of 10 with activity. Patient has night pain, worsening of pain with activity and weight bearing, pain that interferes with activities of daily living, pain with passive range of motion, crepitus and joint swelling. Patient has evidence of periarticular osteophytes and joint space narrowing by imaging studies. There is no active infection. Risks, benefits and expectations were discussed with the patient. Risks including but not limited to the risk of anesthesia, blood clots, nerve  damage, blood vessel damage, failure of the prosthesis, infection and up to and including death. Patient understand the risks, benefits and expectations and wishes to proceed with surgery.   PCP: Garnet Koyanagi, DO   Discharged Condition: good  Hospital Course:  Patient underwent the above stated procedure on 04/11/2014. Patient tolerated the procedure well and brought to the recovery room in good condition and subsequently to the floor.  POD #1 BP: 110/73 ; Pulse: 90 ; Temp: 98.2 F (36.8 C) ; Resp: 18  Patient reports pain as moderate. More of a challenge sleeping with regards to his back and left sided radicular complaints. Otherwise no events, comfortable in chair this am already. Neurovascular intact and incision: dressing C/D/I. Has ice on knee this am  LABS  Basename    HGB  12.0  HCT  33.4     Discharge Exam: General appearance: alert, cooperative and no distress Extremities: Homans sign is negative, no sign of DVT, no edema, redness or tenderness in the calves or thighs and no ulcers, gangrene or trophic changes  Disposition: Home with follow up in 2 weeks   Follow-up Information   Follow up with [name], MD. Schedule an appointment as soon as possible for a visit in 2 weeks.   Specialty:  Orthopedic Surgery   Contact information:   [address] [place] [address] [phone]       Discharge Instructions   Call MD / Call 911    Complete by:  As directed   If you experience chest pain or  shortness of breath, CALL 911 and be transported to the hospital emergency room.  If you develope a fever above 101 F, pus (white drainage) or increased drainage or redness at the wound, or calf pain, call your surgeon's office.     Change dressing    Complete by:  As directed   Maintain surgical dressing for 10-14 days, or until follow up in the clinic.     Constipation Prevention    Complete by:  As directed   Drink plenty of fluids.  Prune juice may  be helpful.  You may use a stool softener, such as Colace (over the counter) 100 mg twice a day.  Use MiraLax (over the counter) for constipation as needed.     Diet - low sodium heart healthy    Complete by:  As directed      Discharge instructions    Complete by:  As directed   Maintain surgical dressing for 10-14 days, or until follow up in the clinic. [facility] in 2 [facility] at [facility]. Call with any questions or concerns.     Increase activity slowly as tolerated    Complete by:  As directed      TED hose    Complete by:  As directed   Use stockings (TED hose) for 2 weeks on both leg(s).  You may remove them at night for sleeping.     Weight bearing as tolerated    Complete by:  As directed   Laterality:  left  Extremity:  Lower             Medication List    STOP taking these medications       meloxicam 15 MG tablet  Commonly known as:  MOBIC     traMADol 50 MG tablet  Commonly known as:  ULTRAM      TAKE these medications       aspirin 325 MG EC tablet  Take 1 tablet (325 mg total) by mouth 2 (two) times daily.     b complex vitamins tablet  Take 1 tablet by mouth daily.     DSS 100 MG Caps  Take 100 mg by mouth 2 (two) times daily.     ferrous sulfate 325 (65 FE) MG tablet  Take 1 tablet (325 mg total) by mouth 3 (three) times daily after meals.     gabapentin 300 MG capsule  Commonly known as:  NEURONTIN  Take 300 mg by mouth 3 (three) times daily.     HYDROcodone-acetaminophen 7.5-325 MG per tablet  Commonly known as:  NORCO  Take 1-2 tablets by mouth every 4 (four) hours.     lisinopril 20 MG tablet  Commonly known as:  PRINIVIL,ZESTRIL  Take 20 mg by mouth every morning.     methocarbamol 500 MG tablet  Commonly known as:  ROBAXIN  Take 1 tablet (500 mg total) by mouth every 6 (six) hours as needed for muscle spasms.     multivitamin with minerals Tabs tablet  Take 1 tablet by mouth daily.     polyethylene glycol packet    Commonly known as:  MIRALAX / GLYCOLAX  Take 17 g by mouth 2 (two) times daily.     predniSONE 10 MG tablet  Commonly known as:  STERAPRED UNI-PAK  Take by mouth daily. As directed on package.         Signed: West Pugh. Kalai Baca   PA-C  04/13/2014, 5:51 PM

## 2014-06-13 ENCOUNTER — Other Ambulatory Visit: Payer: Self-pay | Admitting: Family Medicine

## 2014-07-08 ENCOUNTER — Other Ambulatory Visit: Payer: Self-pay | Admitting: Family Medicine

## 2014-07-08 NOTE — Telephone Encounter (Signed)
Letter mailed to schedule OV.    KP 

## 2014-07-08 NOTE — Telephone Encounter (Signed)
Medication filled by Ronaldo Miyamoto, CMA

## 2014-10-28 ENCOUNTER — Other Ambulatory Visit: Payer: Self-pay | Admitting: Family Medicine

## 2014-10-28 NOTE — Telephone Encounter (Signed)
Patient scheduled for office visit 12/05/14.  Patient Rx for lisinopril refilled until upcoming appointment.  eal

## 2014-12-05 ENCOUNTER — Ambulatory Visit (INDEPENDENT_AMBULATORY_CARE_PROVIDER_SITE_OTHER): Payer: BC Managed Care – PPO | Admitting: Family Medicine

## 2014-12-05 ENCOUNTER — Encounter: Payer: Self-pay | Admitting: Family Medicine

## 2014-12-05 VITALS — BP 108/60 | HR 51 | Temp 98.1°F | Ht 70.0 in | Wt 275.0 lb

## 2014-12-05 DIAGNOSIS — Z23 Encounter for immunization: Secondary | ICD-10-CM

## 2014-12-05 DIAGNOSIS — R195 Other fecal abnormalities: Secondary | ICD-10-CM

## 2014-12-05 DIAGNOSIS — Z0000 Encounter for general adult medical examination without abnormal findings: Secondary | ICD-10-CM | POA: Insufficient documentation

## 2014-12-05 DIAGNOSIS — Z1211 Encounter for screening for malignant neoplasm of colon: Secondary | ICD-10-CM | POA: Insufficient documentation

## 2014-12-05 DIAGNOSIS — I10 Essential (primary) hypertension: Secondary | ICD-10-CM

## 2014-12-05 LAB — POCT URINALYSIS DIPSTICK
Bilirubin, UA: NEGATIVE
Blood, UA: NEGATIVE
Glucose, UA: NEGATIVE
Ketones, UA: NEGATIVE
Leukocytes, UA: NEGATIVE
Nitrite, UA: NEGATIVE
PH UA: 6
Protein, UA: NEGATIVE
Spec Grav, UA: 1.015
Urobilinogen, UA: 0.2

## 2014-12-05 MED ORDER — LISINOPRIL 20 MG PO TABS
ORAL_TABLET | ORAL | Status: DC
Start: 2014-12-05 — End: 2014-12-29

## 2014-12-05 NOTE — Assessment & Plan Note (Signed)
ghm utd Check labs See AVS 

## 2014-12-05 NOTE — Progress Notes (Signed)
Pre visit review using our clinic review tool, if applicable. No additional management support is needed unless otherwise documented below in the visit note. 

## 2014-12-05 NOTE — Progress Notes (Signed)
Subjective:    Patient ID: Shane Haney, male    DOB: 1961/04/30, 54 y.o.   MRN: [id number]  HPI  Patient here for cpe and c/o cough..  He thinks it is from lisinopril  Past Medical History  Diagnosis Date  . Hyperlipidemia   . H/O echocardiogram 2010    seen by Dr. Stanford Breed- told to return as needed   . Hypertension     Dr, Stanford Breed told pt. in 2010, ? extra beats.  . Dysrhythmia     "irregular at one time" (07/21/2013)  . Snores   . Osteoarthritis     "knees and wrists" (07/21/2013)  . Neuromuscular disorder     spinal cord compression, carpal tunnel - R hand  . Left 3 fingers remains with some tingling.   Family History  Problem Relation Age of Onset  . Stroke Father   . Hypertension Father   . Alcohol abuse Father   . Hypertension Mother   . Aneurysm Mother     Brain  . HIV Brother     passed away from AIDS  \ History   Social History  . Marital Status: Married    Spouse Name: N/A  . Number of Children: N/A  . Years of Education: N/A   Occupational History  . teacher--- [employer]   Social History Main Topics  . Smoking status: Never Smoker   . Smokeless tobacco: Never Used  . Alcohol Use: 2.4 oz/week    2 Cans of beer, 2 Shots of liquor per week     Comment: 04-05-14 "3-5 mixed drinks or 6 beers on the weekends  . Drug Use: No  . Sexual Activity:    Partners: Female   Other Topics Concern  . Not on file   Social History Narrative   Exercise-- 5-6 days a week   Current Outpatient Prescriptions  Medication Sig Dispense Refill  . lisinopril (PRINIVIL,ZESTRIL) 20 MG tablet 1/2 tab po qd 30 tablet 1  . Multiple Vitamin (MULTIVITAMIN WITH MINERALS) TABS tablet Take 1 tablet by mouth daily.     No current facility-administered medications for this visit.   History   Social History  . Marital Status: Married    Spouse Name: N/A  . Number of Children: N/A  . Years of Education: N/A   Occupational History  . 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 Smoking status: Never Smoker   . Smokeless tobacco: Never Used  . Alcohol Use: 2.4 oz/week    2 Cans of beer, 2 Shots of liquor per week     Comment: 04-05-14 "3-5 mixed drinks or 6 beers on the weekends  . Drug Use: No  . Sexual Activity:    Partners: Female   Other Topics Concern  . Not on file   Social History Narrative   Exercise-- 5-6 days a week     Review of Systems  Constitutional: Negative.  Negative for fatigue and unexpected weight change.  HENT: Negative for congestion, ear pain, hearing loss, nosebleeds, postnasal drip, rhinorrhea, sinus pressure, sneezing and tinnitus.   Eyes: Negative for photophobia, discharge, itching and visual disturbance.  Respiratory: Negative.  Negative for cough and shortness of breath.   Cardiovascular: Negative.  Negative for chest pain and palpitations.  Gastrointestinal: Negative for abdominal pain, constipation, blood in stool, abdominal distention and anal bleeding.  Endocrine: Negative.   Genitourinary: Negative.   Musculoskeletal: Negative.   Skin: Negative.  Allergic/Immunologic: Negative.   Neurological: Negative for dizziness, weakness, light-headedness, numbness and headaches.  Psychiatric/Behavioral: Negative for suicidal ideas, confusion, sleep disturbance, dysphoric mood, decreased concentration and agitation. The patient is not nervous/anxious.        Objective:    Physical Exam  Constitutional: He is oriented to person, place, and time. He appears well-developed and well-nourished. No distress.  HENT:  Head: Normocephalic and atraumatic.  Right Ear: External ear normal.  Left Ear: External ear normal.  Nose: Nose normal.  Mouth/Throat: Oropharynx is clear and moist. No oropharyngeal exudate.  Eyes: Conjunctivae and EOM are normal. Pupils are equal, round, and reactive to light. Right eye exhibits no discharge. Left eye exhibits no discharge.  Neck: Normal  range of motion. Neck supple. No JVD present. No thyromegaly present.  Cardiovascular: Normal rate, regular rhythm, normal heart sounds and intact distal pulses.  Exam reveals no gallop and no friction rub.   No murmur heard. Pulmonary/Chest: Effort normal and breath sounds normal. No respiratory distress. He has no wheezes. He has no rales. He exhibits no tenderness.  Abdominal: Soft. Bowel sounds are normal. He exhibits no distension and no mass. There is no tenderness. There is no rebound and no guarding.  Genitourinary: Prostate normal and penis normal. Guaiac positive stool.  Musculoskeletal: Normal range of motion. He exhibits no edema or tenderness.  Lymphadenopathy:    He has no cervical adenopathy.  Neurological: He is alert and oriented to person, place, and time. He displays normal reflexes. He exhibits normal muscle tone.  Skin: Skin is warm and dry. No rash noted. He is not diaphoretic. No erythema. No pallor.  Psychiatric: He has a normal mood and affect. His behavior is normal. Judgment and thought content normal.    BP 108/60 mmHg  Pulse 51  Temp(Src) 98.1 F (36.7 C) (Oral)  Ht 5\' 10"  (1.778 m)  Wt 275 lb (124.739 kg)  BMI 39.46 kg/m2  SpO2 98% Wt Readings from Last 3 Encounters:  12/05/14 275 lb (124.739 kg)  04/11/14 280 lb (127.007 kg)  12/03/13 275 lb (124.739 kg)     Lab Results  Component Value Date   WBC 12.7* 04/12/2014   HGB 12.0* 04/12/2014   HCT 33.4* 04/12/2014   PLT 238 04/12/2014   GLUCOSE 130* 04/12/2014   CHOL 196 01/26/2013   TRIG 79.0 01/26/2013   HDL 37.80* 01/26/2013   LDLDIRECT 159.5 12/09/2011   LDLCALC 142* 01/26/2013   ALT 34 07/21/2013   AST 24 07/21/2013   NA 132* 04/12/2014   K 4.4 04/12/2014   CL 97 04/12/2014   CREATININE 0.93 04/12/2014   BUN 18 04/12/2014   CO2 23 04/12/2014   TSH 1.22 01/26/2013   PSA 0.63 01/26/2013   INR 0.99 04/05/2014   HGBA1C 5.5 06/04/2013    No results found.     Assessment & Plan:    Problem List Items Addressed This Visit    Preventative health care - Primary    ghm utd Check labs See AVS      Relevant Orders   Basic metabolic panel   CBC with Differential/Platelet   Hepatic function panel   Lipid panel   POCT urinalysis dipstick (Completed)   PSA   TSH    Other Visit Diagnoses    Essential hypertension        Relevant Medications    lisinopril (PRINIVIL,ZESTRIL) tablet    Other Relevant Orders    Basic metabolic panel    Heme positive stool  Relevant Orders    Ambulatory referral to Gastroenterology        Garnet Koyanagi, DO

## 2014-12-06 ENCOUNTER — Encounter: Payer: Self-pay | Admitting: Gastroenterology

## 2014-12-06 LAB — LIPID PANEL
Cholesterol: 224 mg/dL — ABNORMAL HIGH (ref 0–200)
HDL: 49.4 mg/dL (ref >39.00)
LDL Cholesterol: 156 mg/dL — ABNORMAL HIGH (ref 0–99)
NonHDL: 174.6
TRIGLYCERIDES: 95 mg/dL (ref 0.0–149.0)
Total CHOL/HDL Ratio: 5
VLDL: 19 mg/dL (ref 0.0–40.0)

## 2014-12-06 LAB — BASIC METABOLIC PANEL
BUN: 11 mg/dL (ref 6–23)
CALCIUM: 10 mg/dL (ref 8.4–10.5)
CHLORIDE: 103 meq/L (ref 96–112)
CO2: 28 meq/L (ref 19–32)
CREATININE: 1.04 mg/dL (ref 0.40–1.50)
GFR: 79.8 mL/min (ref >60.00)
Glucose, Bld: 89 mg/dL (ref 70–99)
Potassium: 4.5 mEq/L (ref 3.5–5.1)
Sodium: 139 mEq/L (ref 135–145)

## 2014-12-06 LAB — CBC WITH DIFFERENTIAL/PLATELET
BASOS ABS: 0.1 10*3/uL (ref 0.0–0.1)
Basophils Relative: 0.8 % (ref 0.0–3.0)
Eosinophils Absolute: 0.2 10*3/uL (ref 0.0–0.7)
Eosinophils Relative: 2.8 % (ref 0.0–5.0)
HCT: 45.7 % (ref 39.0–52.0)
Hemoglobin: 15.8 g/dL (ref 13.0–17.0)
LYMPHS ABS: 2.7 10*3/uL (ref 0.7–4.0)
Lymphocytes Relative: 35.6 % (ref 12.0–46.0)
MCHC: 34.6 g/dL (ref 30.0–36.0)
MCV: 92.1 fl (ref 78.0–100.0)
MONO ABS: 0.6 10*3/uL (ref 0.1–1.0)
MONOS PCT: 7.7 % (ref 3.0–12.0)
Neutro Abs: 4.1 10*3/uL (ref 1.4–7.7)
Neutrophils Relative %: 53.1 % (ref 43.0–77.0)
PLATELETS: 267 10*3/uL (ref 150.0–400.0)
RBC: 4.97 Mil/uL (ref 4.22–5.81)
RDW: 12.8 % (ref 11.5–15.5)
WBC: 7.6 10*3/uL (ref 4.0–10.5)

## 2014-12-06 LAB — HEPATIC FUNCTION PANEL
ALT: 26 U/L (ref 0–53)
AST: 31 U/L (ref 0–37)
Albumin: 4.6 g/dL (ref 3.5–5.2)
Alkaline Phosphatase: 67 U/L (ref 39–117)
BILIRUBIN TOTAL: 0.9 mg/dL (ref 0.2–1.2)
Bilirubin, Direct: 0.1 mg/dL (ref 0.0–0.3)
TOTAL PROTEIN: 7.1 g/dL (ref 6.0–8.3)

## 2014-12-06 LAB — PSA: PSA: 0.84 ng/mL (ref 0.10–4.00)

## 2014-12-06 LAB — TSH: TSH: 1.38 u[IU]/mL (ref 0.35–4.50)

## 2014-12-07 NOTE — Addendum Note (Signed)
Addended by: Ewing Schlein on: 12/07/2014 10:15 AM   Modules accepted: Orders

## 2014-12-08 ENCOUNTER — Encounter: Payer: Self-pay | Admitting: Gastroenterology

## 2014-12-13 ENCOUNTER — Other Ambulatory Visit: Payer: Self-pay

## 2014-12-13 MED ORDER — SIMVASTATIN 20 MG PO TABS: 20.0000 mg | ORAL_TABLET | Freq: Every day | ORAL | Status: DC

## 2014-12-29 ENCOUNTER — Other Ambulatory Visit: Payer: Self-pay | Admitting: Family Medicine

## 2015-02-08 ENCOUNTER — Encounter: Payer: BC Managed Care – PPO | Admitting: Gastroenterology

## 2015-03-16 IMAGING — CR DG CHEST 2V
2 series · 2 of 2 positions shown · non-contrast
Comparison: None.

CLINICAL DATA: Preop

EXAM:
CHEST  2 VIEW

[w chest pa]
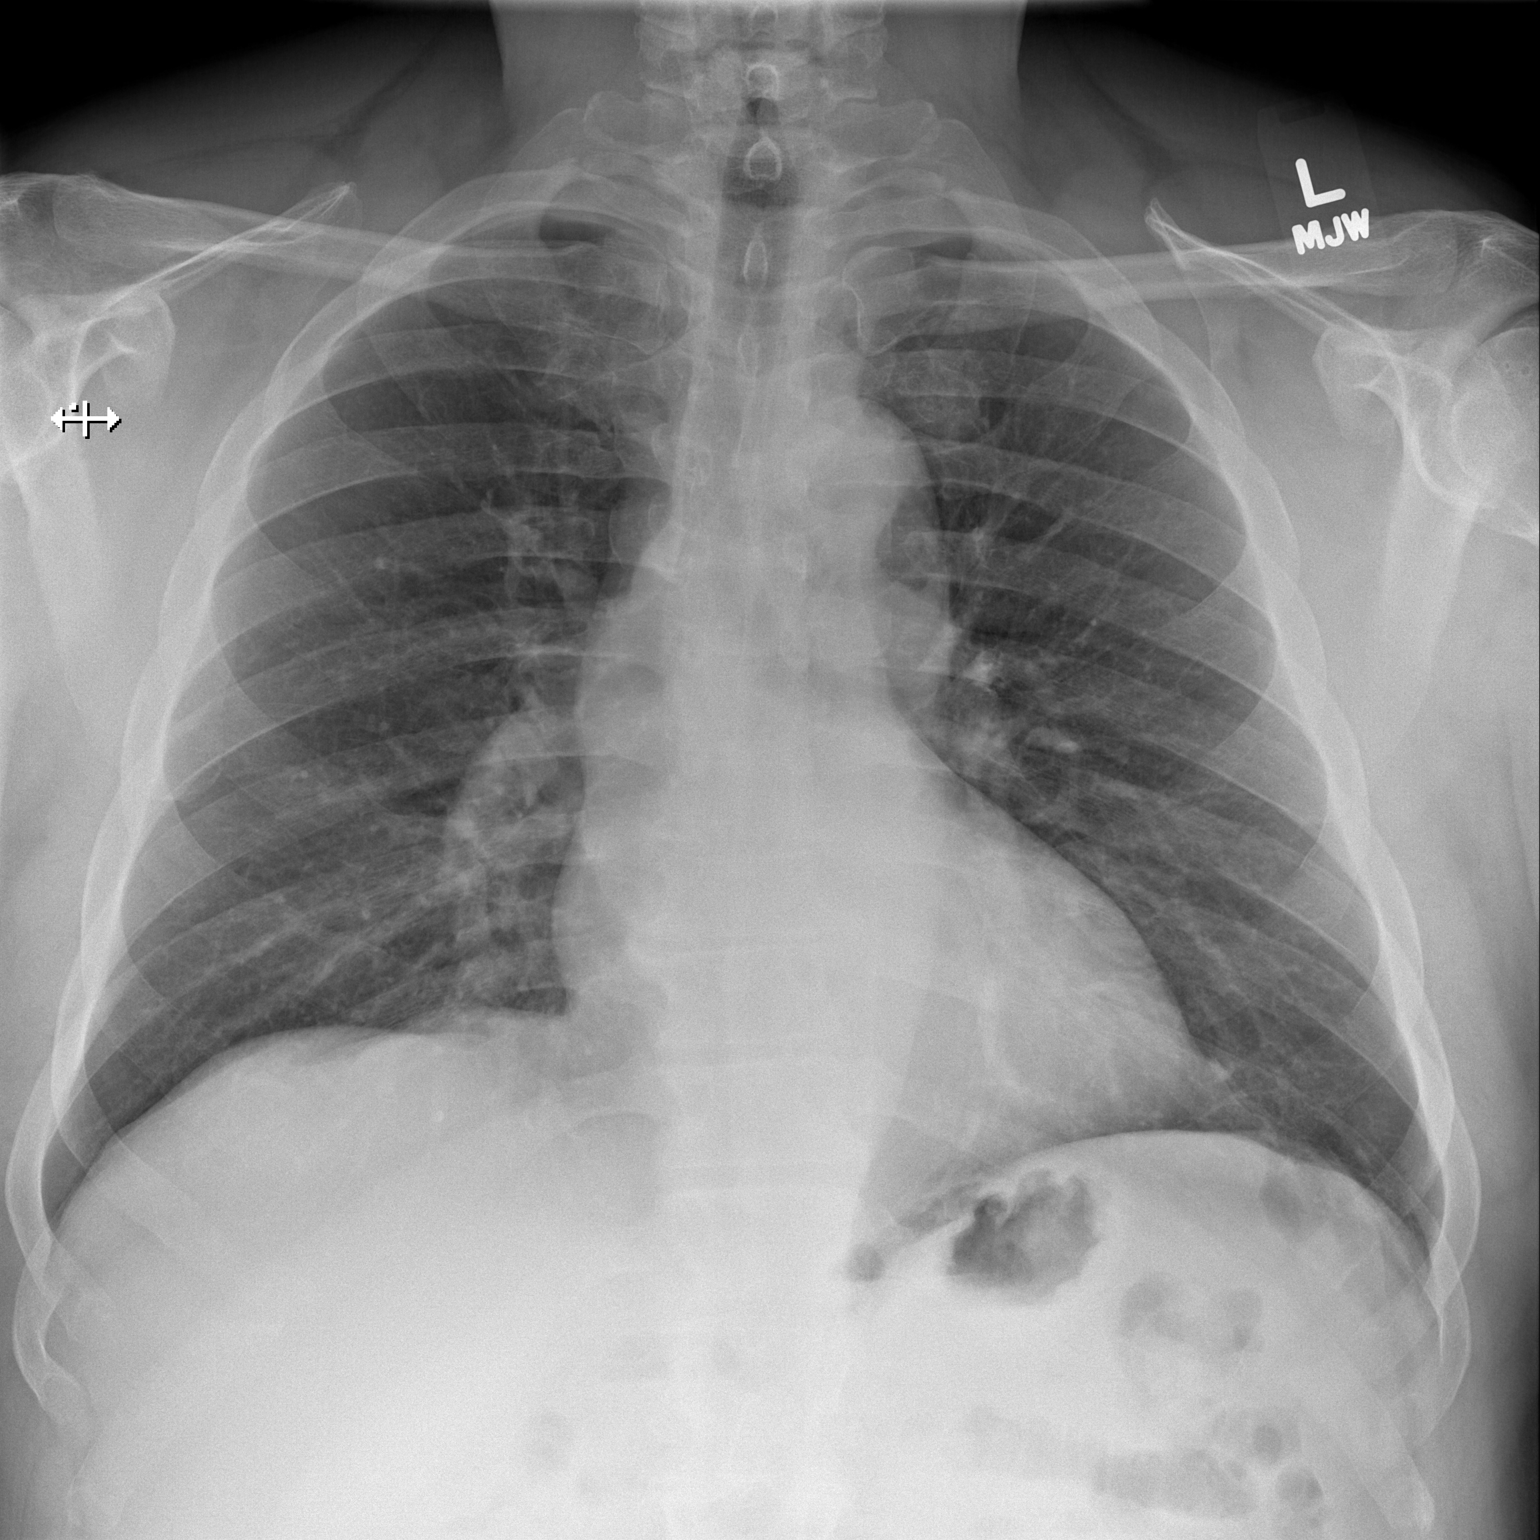

[w chest lat]
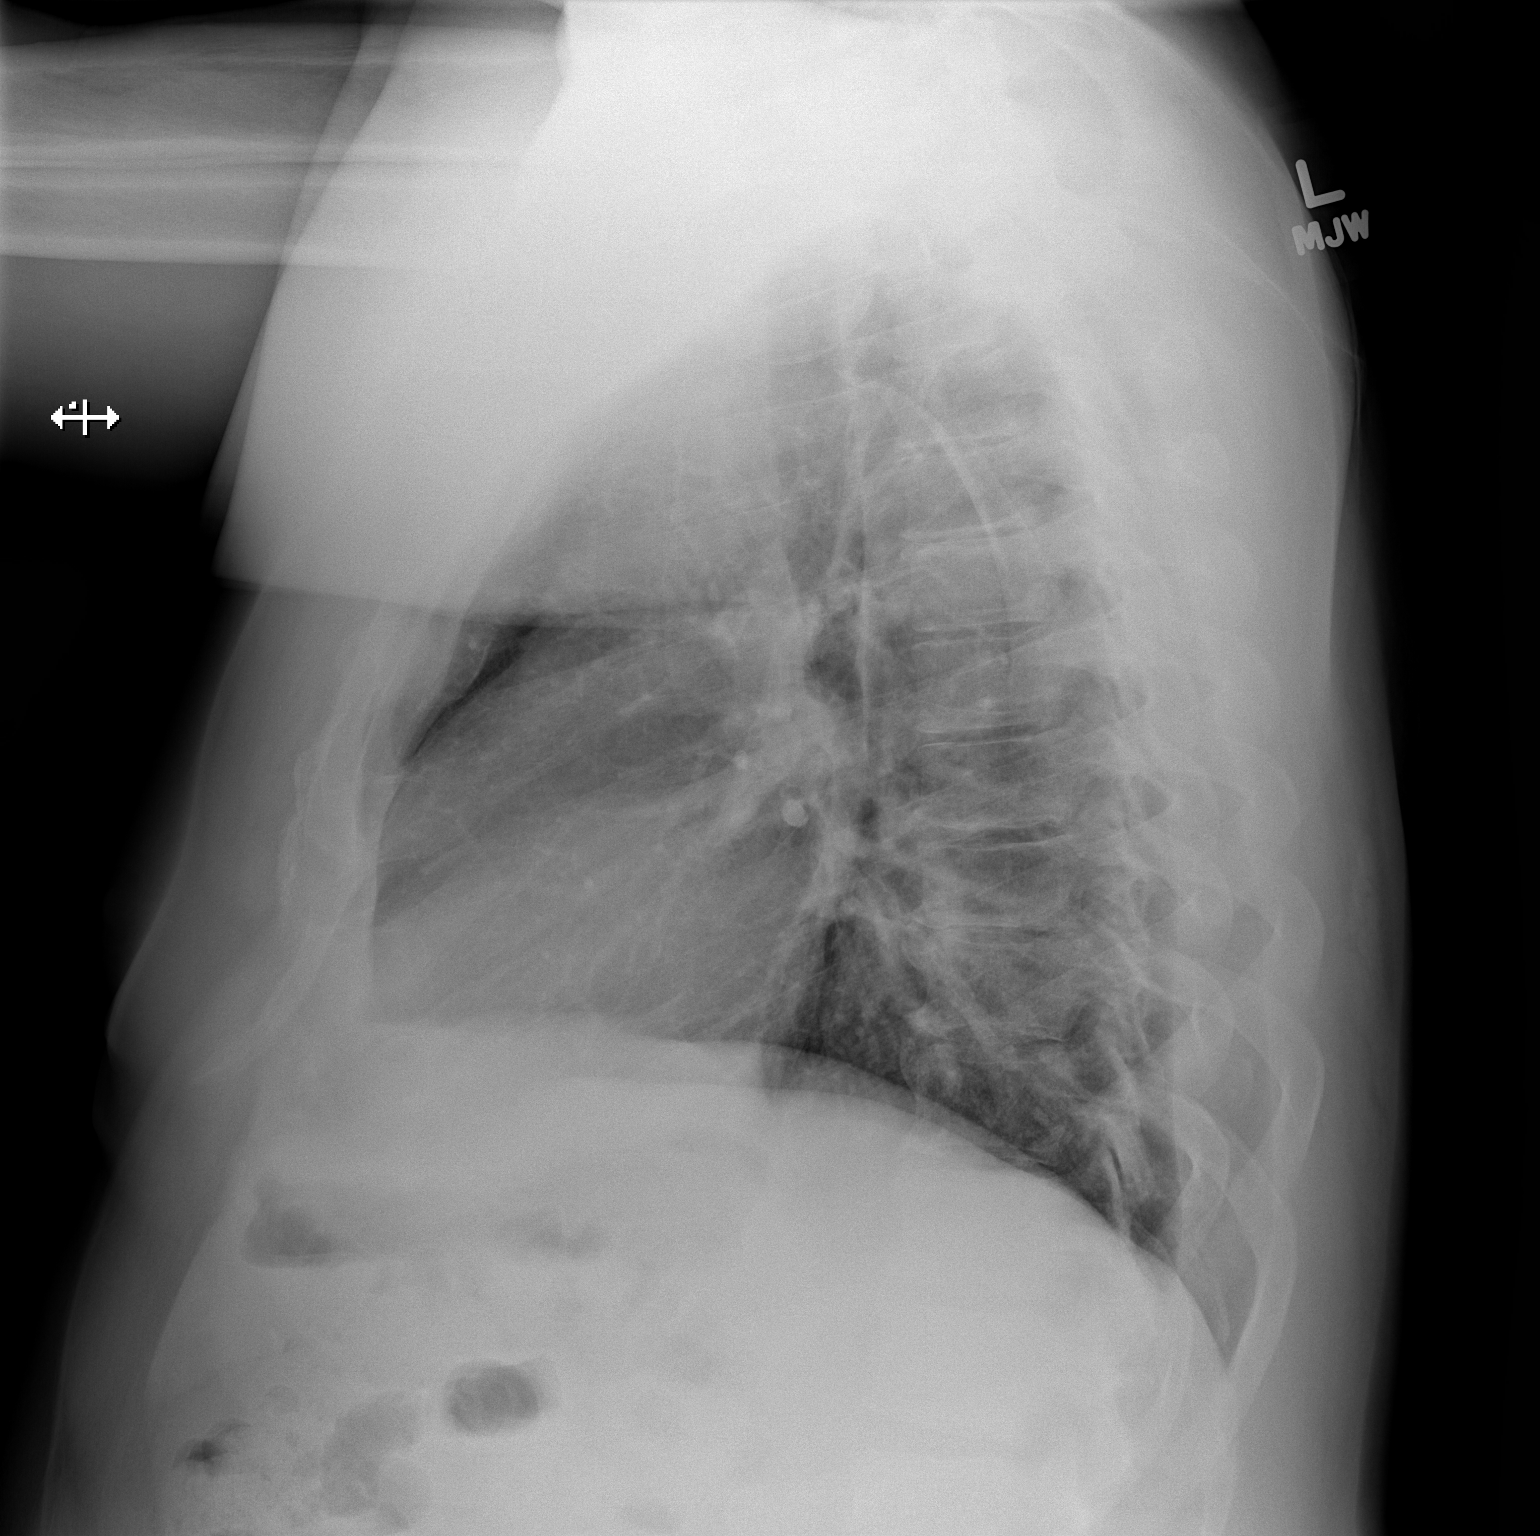

[2 of 2 positions shown; findings below may reference images not displayed]

FINDINGS: The heart size and mediastinal contours are within normal limits.
Both lungs are clear. Mild degenerative changes lower thoracic
spine. Minimal elevation of the right hemidiaphragm.
IMPRESSION: No active cardiopulmonary disease. Mild degenerative changes lower
thoracic spine.

## 2015-03-22 ENCOUNTER — Encounter: Payer: Self-pay | Admitting: Gastroenterology

## 2015-03-30 ENCOUNTER — Encounter: Payer: BC Managed Care – PPO | Admitting: Gastroenterology

## 2015-05-16 ENCOUNTER — Encounter: Payer: BC Managed Care – PPO | Admitting: *Deleted

## 2015-05-18 ENCOUNTER — Encounter: Payer: BC Managed Care – PPO | Admitting: *Deleted

## 2015-05-18 ENCOUNTER — Ambulatory Visit (AMBULATORY_SURGERY_CENTER): Payer: Self-pay

## 2015-05-18 VITALS — Ht 70.0 in | Wt 266.8 lb

## 2015-05-18 DIAGNOSIS — Z1211 Encounter for screening for malignant neoplasm of colon: Secondary | ICD-10-CM

## 2015-05-18 MED ORDER — SUPREP BOWEL PREP KIT 17.5-3.13-1.6 GM/177ML PO SOLN: 1.0000 | Freq: Once | ORAL | Status: DC

## 2015-05-18 NOTE — Progress Notes (Signed)
No allergies to eggs or soy No diet/weight loss meds No home oxygen No past problems with anesthesia  Has email  Emmi instructions given for colonoscopy 

## 2015-05-19 ENCOUNTER — Encounter: Payer: Self-pay | Admitting: Gastroenterology

## 2015-05-30 ENCOUNTER — Encounter: Payer: Self-pay | Admitting: Gastroenterology

## 2015-05-30 ENCOUNTER — Ambulatory Visit (AMBULATORY_SURGERY_CENTER): Payer: BC Managed Care – PPO | Admitting: Gastroenterology

## 2015-05-30 VITALS — BP 103/75 | HR 58 | Temp 97.6°F | Resp 18 | Ht 70.0 in | Wt 266.0 lb

## 2015-05-30 DIAGNOSIS — R195 Other fecal abnormalities: Secondary | ICD-10-CM | POA: Diagnosis present

## 2015-05-30 DIAGNOSIS — D125 Benign neoplasm of sigmoid colon: Secondary | ICD-10-CM

## 2015-05-30 MED ORDER — SODIUM CHLORIDE 0.9 % IV SOLN: 500.0000 mL | INTRAVENOUS | Status: DC

## 2015-05-30 NOTE — Patient Instructions (Signed)
YOU HAD AN ENDOSCOPIC PROCEDURE TODAY AT [facility]:   Refer to the procedure report that was given to you for any specific questions about what was found during the examination.  If the procedure report does not answer your questions, please call your gastroenterologist to clarify.  If you requested that your care partner not be given the details of your procedure findings, then the procedure report has been included in a sealed envelope for you to review at your convenience later.  YOU SHOULD EXPECT: Some feelings of bloating in the abdomen. Passage of more gas than usual.  Walking can help get rid of the air that was put into your GI tract during the procedure and reduce the bloating. If you had a lower endoscopy (such as a colonoscopy or flexible sigmoidoscopy) you may notice spotting of blood in your stool or on the toilet paper. If you underwent a bowel prep for your procedure, you may not have a normal bowel movement for a few days.  Please Note:  You might notice some irritation and congestion in your nose or some drainage.  This is from the oxygen used during your procedure.  There is no need for concern and it should clear up in a day or so.  SYMPTOMS TO REPORT IMMEDIATELY:   Following lower endoscopy (colonoscopy or flexible sigmoidoscopy):  Excessive amounts of blood in the stool  Significant tenderness or worsening of abdominal pains  Swelling of the abdomen that is new, acute  Fever of 100F or higher   For urgent or emergent issues, a gastroenterologist can be reached at any hour by calling [phone].   DIET: Your first meal following the procedure should be a small meal and then it is ok to progress to your normal diet. Heavy or fried foods are harder to digest and may make you feel nauseous or bloated.  Likewise, meals heavy in dairy and vegetables can increase bloating.  Drink plenty of fluids but you should avoid alcoholic beverages for 24  hours.  ACTIVITY:  You should plan to take it easy for the rest of today and you should NOT DRIVE or use heavy machinery until tomorrow (because of the sedation medicines used during the test).    FOLLOW UP: Our staff will call the number listed on your records the next business day following your procedure to check on you and address any questions or concerns that you may have regarding the information given to you following your procedure. If we do not reach you, we will leave a message.  However, if you are feeling well and you are not experiencing any problems, there is no need to return our call.  We will assume that you have returned to your regular daily activities without incident.  If any biopsies were taken you will be contacted by phone or by letter within the next 1-3 weeks.  Please call us at [phone] if you have not heard about the biopsies in 3 weeks.    SIGNATURES/CONFIDENTIALITY: You and/or your care partner have signed paperwork which will be entered into your electronic medical record.  These signatures attest to the fact that that the information above on your After Visit Summary has been reviewed and is understood.  Full responsibility of the confidentiality of this discharge information lies with you and/or your care -partner.  Diverticulosis/ Polys handout given High fiber diet with liberal fluid intake Await pathology report

## 2015-05-30 NOTE — Progress Notes (Signed)
Report to PACU, RN, vss, BBS= Clear.  

## 2015-05-30 NOTE — Progress Notes (Signed)
Called to room to assist during endoscopic procedure.  Patient ID and intended procedure confirmed with present staff. Received instructions for my participation in the procedure from the performing physician.  

## 2015-05-30 NOTE — Op Note (Signed)
[place]  [address]. [place], [address]   COLONOSCOPY PROCEDURE REPORT  PATIENT: Shane Haney, Shane Haney  MR#: [id number] BIRTHDATE: [birth year] , 54  yrs. old GENDER: male ENDOSCOPIST: Ladene Artist, MD, [facility] REFERRED LX:BWIOMB Lowne, DO PROCEDURE DATE:  05/30/2015 PROCEDURE:   Colonoscopy, diagnostic and Colonoscopy with snare polypectomy First Screening Colonoscopy - Avg.  risk and is 50 yrs.  old or older - No.  Prior Negative Screening - Now for repeat screening. N/A  History of Adenoma - Now for follow-up colonoscopy & has been > or = to 3 yrs.  N/A  Polyps removed today? Yes ASA CLASS:   Class II INDICATIONS:Evaluation of unexplained GI bleeding, Colorectal Neoplasm Risk Assessment for this procedure is average risk, hematochezia, and heme-positive stool. MEDICATIONS: Monitored anesthesia care and Propofol 260 mg IV DESCRIPTION OF PROCEDURE:   After the risks benefits and alternatives of the procedure were thoroughly explained, informed consent was obtained.  The digital rectal exam revealed no abnormalities of the rectum.   The LB PCF Q180 [id number]  endoscope was introduced through the anus and advanced to the cecum, which was identified by both the appendix and ileocecal valve. No adverse events experienced.   The quality of the prep was good.  (Suprep was used)  The instrument was then slowly withdrawn as the colon was fully examined. Estimated blood loss is zero unless otherwise noted in this procedure report.    COLON FINDINGS: There was moderate diverticulosis noted in the descending colon, sigmoid colon, ascending colon, and transverse colon.   A sessile polyp measuring 5 mm in size was found in the sigmoid colon.  A polypectomy was performed in a piecemeal fashion. A polypectomy was performed with a cold snare.  The resection was complete, the polyp tissue was completely retrieved and sent to histology.   The examination was otherwise normal.   Retroflexed views revealed internal Grade I hemorrhoids. The time to cecum = 2.0 Withdrawal time = 10.7   The scope was withdrawn and the procedure completed. COMPLICATIONS: There were no immediate complications.  ENDOSCOPIC IMPRESSION: 1.   Moderate diverticulosis in the descending colon, sigmoid colon, ascending colon, and transverse colon 2.   Sessile polyp in the sigmoid colon; polypectomy performed with a cold snare 3.   Grade l internal hemorrhoids  RECOMMENDATIONS: 1.  Await pathology results 2.  High fiber diet with liberal fluid intake. 3.  Repeat colonoscopy in 5 years if polyp adenomatous; otherwise 10 years  eSigned:  Ladene Artist, MD, [facility] 05/30/2015 9:29 AM

## 2015-05-31 ENCOUNTER — Telehealth: Payer: Self-pay

## 2015-05-31 NOTE — Telephone Encounter (Signed)
No answer, left voicemail message.

## 2015-06-05 ENCOUNTER — Encounter: Payer: Self-pay | Admitting: Gastroenterology

## 2015-09-06 ENCOUNTER — Ambulatory Visit (INDEPENDENT_AMBULATORY_CARE_PROVIDER_SITE_OTHER): Payer: BC Managed Care – PPO | Admitting: Family

## 2015-09-06 ENCOUNTER — Encounter: Payer: Self-pay | Admitting: Family

## 2015-09-06 VITALS — BP 132/79 | HR 77 | Temp 98.4°F | Resp 18 | Ht 70.0 in | Wt 275.2 lb

## 2015-09-06 DIAGNOSIS — J0100 Acute maxillary sinusitis, unspecified: Secondary | ICD-10-CM | POA: Diagnosis not present

## 2015-09-06 MED ORDER — BENZONATATE 100 MG PO CAPS: 100.0000 mg | ORAL_CAPSULE | Freq: Three times a day (TID) | ORAL | Status: DC | PRN

## 2015-09-06 MED ORDER — AMOXICILLIN-POT CLAVULANATE 875-125 MG PO TABS: 1.0000 | ORAL_TABLET | Freq: Two times a day (BID) | ORAL | Status: DC

## 2015-09-06 NOTE — Patient Instructions (Signed)
Please start augmentin for sinus infection. You may use tessalon as needed for cough. Call if symptoms worsen, if you develop fever or if symptoms are not improved in 3 days.

## 2015-09-06 NOTE — Progress Notes (Signed)
Pre visit review using our clinic review tool, if applicable. No additional management support is needed unless otherwise documented below in the visit note. 

## 2015-09-06 NOTE — Progress Notes (Signed)
Subjective:    Patient ID: Shane Haney, male    DOB: 22-Feb-1961, 54 y.o.   MRN: [id number]  HPI  Mr. Weiand is a 54 yr old male who presents today with chief complaint of cough. Cough has been present x 1 week and is worst at night.   Reports associated sinus drainage and headache.  Using mucinex and alka seltzer sinus.  Patient's son has had similar symptoms.  Nasla drainage is described as clear to yellow in color.  Reports + sinus HA and pressure. Denies associated fever.    Review of Systems See HPI  Past Medical History  Diagnosis Date  . Hyperlipidemia   . H/O echocardiogram 2010    seen by Dr. Stanford Breed- told to return as needed   . Hypertension     Dr, Stanford Breed told pt. in 2010, ? extra beats.  . Dysrhythmia     "irregular at one time" (07/21/2013)  . Snores   . Osteoarthritis     "knees and wrists" (07/21/2013)  . Neuromuscular disorder ([place])     spinal cord compression, carpal tunnel - R hand  . Left 3 fingers remains with some tingling.    Social History   Social History  . Marital Status: Married    Spouse Name: N/A  . Number of Children: N/A  . Years of Education: N/A   Occupational History  . 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 Smoking status: Never Smoker   . Smokeless tobacco: Never Used  . Alcohol Use: 2.4 oz/week    2 Cans of beer, 2 Shots of liquor per week     Comment: 04-05-14 "3-5 mixed drinks or 6 beers on the weekends  . Drug Use: No  . Sexual Activity:    Partners: Female   Other Topics Concern  . Not on file   Social History Narrative   Exercise-- 5-6 days a week    Past Surgical History  Procedure Laterality Date  . Anterior cruciate ligament repair Left 1999  . Knee arthroscopy Right 2007  . Open reduction internal fixation (orif) foot lisfranc fracture Right 1993    R- /w ORIF  . Tonsillectomy    . Carpal tunnel release Left 05/2013  . Anterior cervical decomp/discectomy fusion N/A  07/08/2013    Procedure: ANTERIOR CERVICAL DECOMPRESSION/DISCECTOMY FUSION 2 LEVELS C3-C5;  Surgeon: Melina Schools, MD;  Location: [facility];  Service: Orthopedics;  Laterality: N/A;  . Carpal tunnel release Right 07/08/2013    Procedure: LIMITED OPEN RIGHT CARPAL TUNNEL RELEASE;  Surgeon: Roseanne Kaufman, MD;  Location: [facility];  Service: Orthopedics;  Laterality: Right;  . Incision and drainage of wound N/A 07/21/2013    Procedure: IRRIGATION AND DEBRIDEMENT OF CERVICAL WOUND;  Surgeon: Melina Schools, MD;  Location: [facility];  Service: Orthopedics;  Laterality: N/A;  . Total knee arthroplasty Left 04/11/2014    Procedure: LEFT TOTAL KNEE ARTHROPLASTY WITH HARDWARE REMOVAL;  Surgeon: [name], MD;  Location: [facility];  Service: Orthopedics;  Laterality: Left;    Family History  Problem Relation Age of Onset  . Stroke Father   . Hypertension Father   . Alcohol abuse Father   . Hypertension Mother   . Aneurysm Mother     Brain  . HIV Brother     passed away from AIDS  . Colon cancer Neg Hx   . Colon polyps Neg Hx     No Known Allergies  Current Outpatient Prescriptions  on File Prior to Visit  Medication Sig Dispense Refill  . lisinopril (PRINIVIL,ZESTRIL) 20 MG tablet TAKE 1 TABLET BY MOUTH DAILY 30 tablet 11  . Multiple Vitamin (MULTIVITAMIN WITH MINERALS) TABS tablet Take 1 tablet by mouth daily.    . simvastatin (ZOCOR) 20 MG tablet Take 1 tablet (20 mg total) by mouth at bedtime. 30 tablet 2   No current facility-administered medications on file prior to visit.    BP 132/79 mmHg  Pulse 77  Temp(Src) 98.4 F (36.9 C) (Oral)  Resp 18  Ht 5\' 10"  (1.778 m)  Wt 275 lb 3.2 oz (124.83 kg)  BMI 39.49 kg/m2  SpO2 96%       Objective:   Physical Exam  Constitutional: He is oriented to person, place, and time. He appears well-developed and well-nourished. No distress.  HENT:  Head: Normocephalic and atraumatic.  Right Ear: Tympanic membrane and ear canal normal.  Left Ear:  Tympanic membrane and ear canal normal.  Nose: Right sinus exhibits maxillary sinus tenderness. Right sinus exhibits no frontal sinus tenderness. Left sinus exhibits maxillary sinus tenderness. Left sinus exhibits no frontal sinus tenderness.  Mouth/Throat: No posterior oropharyngeal erythema.  Eyes: Conjunctivae are normal. No scleral icterus.  Cardiovascular: Normal rate and regular rhythm.   No murmur heard. Pulmonary/Chest: Effort normal and breath sounds normal. No respiratory distress. He has no wheezes. He has no rales.  Musculoskeletal: He exhibits no edema.  Lymphadenopathy:    He has no cervical adenopathy.  Neurological: He is alert and oriented to person, place, and time.  Skin: Skin is warm and dry.  Psychiatric: He has a normal mood and affect. His behavior is normal. Thought content normal.          Assessment & Plan:  Sinusitis- new. advised pt as follows:  Please start augmentin for sinus infection. You may use tessalon as needed for cough. Call if symptoms worsen, if you develop fever or if symptoms are not improved in 3 days.

## 2015-12-05 ENCOUNTER — Encounter: Payer: Self-pay | Admitting: Physician Assistant

## 2015-12-05 ENCOUNTER — Telehealth: Payer: Self-pay | Admitting: Family Medicine

## 2015-12-05 ENCOUNTER — Ambulatory Visit (INDEPENDENT_AMBULATORY_CARE_PROVIDER_SITE_OTHER): Payer: Managed Care, Other (non HMO) | Admitting: Physician Assistant

## 2015-12-05 VITALS — BP 117/77 | HR 79 | Temp 98.0°F | Ht 70.0 in | Wt 272.0 lb

## 2015-12-05 DIAGNOSIS — B9689 Other specified bacterial agents as the cause of diseases classified elsewhere: Secondary | ICD-10-CM

## 2015-12-05 DIAGNOSIS — J019 Acute sinusitis, unspecified: Secondary | ICD-10-CM

## 2015-12-05 MED ORDER — AMOXICILLIN-POT CLAVULANATE 875-125 MG PO TABS: 1.0000 | ORAL_TABLET | Freq: Two times a day (BID) | ORAL | Status: DC

## 2015-12-05 MED ORDER — BENZONATATE 200 MG PO CAPS: 200.0000 mg | ORAL_CAPSULE | Freq: Two times a day (BID) | ORAL | Status: DC | PRN

## 2015-12-05 NOTE — Progress Notes (Signed)
Patient presents to clinic today c/o sinus pressure, sinus pain and cough productive of clear sputum over the past 6 days. Endorses symptoms have been gradually worsening. Endorses some aches and fatigues. Denies fever, chills. Denies recent travel. Denies sick contact. Patient endorses taking Tylenol -- Cold and Flu.   Past Medical History  Diagnosis Date  . Hyperlipidemia   . H/O echocardiogram 2010    seen by Dr. Stanford Breed- told to return as needed   . Hypertension     Dr, Stanford Breed told pt. in 2010, ? extra beats.  . Dysrhythmia     "irregular at one time" (07/21/2013)  . Snores   . Osteoarthritis     "knees and wrists" (07/21/2013)  . Neuromuscular disorder ([place])     spinal cord compression, carpal tunnel - R hand  . Left 3 fingers remains with some tingling.    Current Outpatient Prescriptions on File Prior to Visit  Medication Sig Dispense Refill  . lisinopril (PRINIVIL,ZESTRIL) 20 MG tablet TAKE 1 TABLET BY MOUTH DAILY 30 tablet 11  . Multiple Vitamin (MULTIVITAMIN WITH MINERALS) TABS tablet Take 1 tablet by mouth daily.    . simvastatin (ZOCOR) 20 MG tablet Take 1 tablet (20 mg total) by mouth at bedtime. 30 tablet 2   No current facility-administered medications on file prior to visit.    No Known Allergies  Family History  Problem Relation Age of Onset  . Stroke Father   . Hypertension Father   . Alcohol abuse Father   . Hypertension Mother   . Aneurysm Mother     Brain  . HIV Brother     passed away from AIDS  . Colon cancer Neg Hx   . Colon polyps Neg Hx     Social History   Social History  . Marital Status: Married    Spouse Name: N/A  . Number of Children: N/A  . Years of Education: N/A   Occupational History  . 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 Smoking status: Never Smoker   . Smokeless tobacco: Never Used  . Alcohol Use: 2.4 oz/week    2 Cans of beer, 2 Shots of liquor per week     Comment: 04-05-14 "3-5  mixed drinks or 6 beers on the weekends  . Drug Use: No  . Sexual Activity:    Partners: Female   Other Topics Concern  . None   Social History Narrative   Exercise-- 5-6 days a week   Review of Systems - See HPI.  All other ROS are negative.  BP 117/77 mmHg  Pulse 79  Temp(Src) 98 F (36.7 C) (Oral)  Ht 5\' 10"  (1.778 m)  Wt 272 lb (123.378 kg)  BMI 39.03 kg/m2  SpO2 96%  Physical Exam  Constitutional: He is oriented to person, place, and time and well-developed, well-nourished, and in no distress.  HENT:  Head: Normocephalic and atraumatic.  Right Ear: External ear normal.  Left Ear: External ear normal.  Nose: Nose normal.  Mouth/Throat: Oropharynx is clear and moist. No oropharyngeal exudate.  TM within normal limits  Eyes: Conjunctivae are normal.  Neck: Neck supple.  Cardiovascular: Normal rate, regular rhythm, normal heart sounds and intact distal pulses.   Pulmonary/Chest: Effort normal and breath sounds normal. No respiratory distress. He has no wheezes. He has no rales. He exhibits no tenderness.  Neurological: He is alert and oriented to person, place, and time.  Skin: Skin is warm and  dry. No rash noted.  Psychiatric: Affect normal.  Vitals reviewed.    Assessment/Plan: Acute bacterial sinusitis Rx Augmentin.  Increase fluids.  Rest.  Saline nasal spray.  Probiotic.  Mucinex as directed.  Humidifier in bedroom. Tessalon per orders.  Call or return to clinic if symptoms are not improving.

## 2015-12-05 NOTE — Telephone Encounter (Signed)
LVM inquiring about flu shot

## 2015-12-05 NOTE — Assessment & Plan Note (Signed)
Rx Augmentin.  Increase fluids.  Rest.  Saline nasal spray.  Probiotic.  Mucinex as directed.  Humidifier in bedroom. Tessalon per orders.  Call or return to clinic if symptoms are not improving.

## 2015-12-05 NOTE — Progress Notes (Signed)
Pre visit review using our clinic review tool, if applicable. No additional management support is needed unless otherwise documented below in the visit note. 

## 2015-12-05 NOTE — Patient Instructions (Signed)
Please take antibiotic as directed.  Increase fluid intake.  Use Saline nasal spray.  Take a daily multivitamin. Use the Tessalon as directed for cough.  Place a humidifier in the bedroom.  Please call or return clinic if symptoms are not improving.  Sinusitis Sinusitis is redness, soreness, and swelling (inflammation) of the paranasal sinuses. Paranasal sinuses are air pockets within the bones of your face (beneath the eyes, the middle of the forehead, or above the eyes). In healthy paranasal sinuses, mucus is able to drain out, and air is able to circulate through them by way of your nose. However, when your paranasal sinuses are inflamed, mucus and air can become trapped. This can allow bacteria and other germs to grow and cause infection. Sinusitis can develop quickly and last only a short time (acute) or continue over a long period (chronic). Sinusitis that lasts for more than 12 weeks is considered chronic.  CAUSES  Causes of sinusitis include:  Allergies.  Structural abnormalities, such as displacement of the cartilage that separates your nostrils (deviated septum), which can decrease the air flow through your nose and sinuses and affect sinus drainage.  Functional abnormalities, such as when the small hairs (cilia) that line your sinuses and help remove mucus do not work properly or are not present. SYMPTOMS  Symptoms of acute and chronic sinusitis are the same. The primary symptoms are pain and pressure around the affected sinuses. Other symptoms include:  Upper toothache.  Earache.  Headache.  Bad breath.  Decreased sense of smell and taste.  A cough, which worsens when you are lying flat.  Fatigue.  Fever.  Thick drainage from your nose, which often is green and may contain pus (purulent).  Swelling and warmth over the affected sinuses. DIAGNOSIS  Your caregiver will perform a physical exam. During the exam, your caregiver may:  Look in your nose for signs of abnormal  growths in your nostrils (nasal polyps).  Tap over the affected sinus to check for signs of infection.  View the inside of your sinuses (endoscopy) with a special imaging device with a light attached (endoscope), which is inserted into your sinuses. If your caregiver suspects that you have chronic sinusitis, one or more of the following tests may be recommended:  Allergy tests.  Nasal culture A sample of mucus is taken from your nose and sent to a lab and screened for bacteria.  Nasal cytology A sample of mucus is taken from your nose and examined by your caregiver to determine if your sinusitis is related to an allergy. TREATMENT  Most cases of acute sinusitis are related to a viral infection and will resolve on their own within 10 days. Sometimes medicines are prescribed to help relieve symptoms (pain medicine, decongestants, nasal steroid sprays, or saline sprays).  However, for sinusitis related to a bacterial infection, your caregiver will prescribe antibiotic medicines. These are medicines that will help kill the bacteria causing the infection.  Rarely, sinusitis is caused by a fungal infection. In theses cases, your caregiver will prescribe antifungal medicine. For some cases of chronic sinusitis, surgery is needed. Generally, these are cases in which sinusitis recurs more than 3 times per year, despite other treatments. HOME CARE INSTRUCTIONS   Drink plenty of water. Water helps thin the mucus so your sinuses can drain more easily.  Use a humidifier.  Inhale steam 3 to 4 times a day (for example, sit in the bathroom with the shower running).  Apply a warm, moist washcloth to your   face 3 to 4 times a day, or as directed by your caregiver.  Use saline nasal sprays to help moisten and clean your sinuses.  Take over-the-counter or prescription medicines for pain, discomfort, or fever only as directed by your caregiver. SEEK IMMEDIATE MEDICAL CARE IF:  You have increasing pain or  severe headaches.  You have nausea, vomiting, or drowsiness.  You have swelling around your face.  You have vision problems.  You have a stiff neck.  You have difficulty breathing. MAKE SURE YOU:   Understand these instructions.  Will watch your condition.  Will get help right away if you are not doing well or get worse. Document Released: 09/30/2005 Document Revised: 12/23/2011 Document Reviewed: 10/15/2011 [facility] Patient Information 2014 [place], [place].

## 2016-01-13 ENCOUNTER — Other Ambulatory Visit: Payer: Self-pay | Admitting: Family Medicine

## 2016-01-15 NOTE — Telephone Encounter (Signed)
Please schedule this patient a Physical Exam with Dr.Lowne.     KP

## 2016-01-16 ENCOUNTER — Other Ambulatory Visit: Payer: Self-pay

## 2016-01-16 MED ORDER — LISINOPRIL 20 MG PO TABS: 20.0000 mg | ORAL_TABLET | Freq: Every day | ORAL | Status: DC

## 2016-01-16 NOTE — Telephone Encounter (Signed)
Left message for patient to call and schedule CPE

## 2016-02-14 ENCOUNTER — Other Ambulatory Visit: Payer: Self-pay | Admitting: Family Medicine

## 2016-04-29 ENCOUNTER — Encounter: Payer: Self-pay | Admitting: Family Medicine

## 2016-04-29 ENCOUNTER — Ambulatory Visit (INDEPENDENT_AMBULATORY_CARE_PROVIDER_SITE_OTHER): Payer: Managed Care, Other (non HMO) | Admitting: Family Medicine

## 2016-04-29 VITALS — BP 109/75 | HR 66 | Temp 98.3°F | Ht 70.0 in | Wt 281.8 lb

## 2016-04-29 DIAGNOSIS — E785 Hyperlipidemia, unspecified: Secondary | ICD-10-CM

## 2016-04-29 DIAGNOSIS — I10 Essential (primary) hypertension: Secondary | ICD-10-CM

## 2016-04-29 DIAGNOSIS — Z1159 Encounter for screening for other viral diseases: Secondary | ICD-10-CM | POA: Diagnosis not present

## 2016-04-29 DIAGNOSIS — Z0000 Encounter for general adult medical examination without abnormal findings: Secondary | ICD-10-CM

## 2016-04-29 LAB — POCT URINALYSIS DIPSTICK
BILIRUBIN UA: NEGATIVE
Blood, UA: NEGATIVE
Glucose, UA: NEGATIVE
Ketones, UA: NEGATIVE
LEUKOCYTES UA: NEGATIVE
NITRITE UA: NEGATIVE
PH UA: 6
PROTEIN UA: NEGATIVE
Spec Grav, UA: >=1.03
Urobilinogen, UA: 0.2

## 2016-04-29 LAB — HEPATITIS C ANTIBODY: HCV Ab: NEGATIVE

## 2016-04-29 MED ORDER — HYDROCORTISONE ACETATE 25 MG RE SUPP: 25.0000 mg | Freq: Two times a day (BID) | RECTAL | Status: DC

## 2016-04-29 MED ORDER — LOSARTAN POTASSIUM 50 MG PO TABS: 50.0000 mg | ORAL_TABLET | Freq: Every day | ORAL | Status: DC

## 2016-04-29 NOTE — Patient Instructions (Signed)

## 2016-04-29 NOTE — Progress Notes (Signed)
Pre visit review using our clinic review tool, if applicable. No additional management support is needed unless otherwise documented below in the visit note. 

## 2016-04-29 NOTE — Progress Notes (Signed)
Patient ID: Shane Haney, male    DOB: [birth year]  Age: 55 y.o. MRN: [id number]    Subjective:  Subjective HPI Shane Haney presents for cpe and labs.  Pt is not taking zocor because he wanted to try to dec cholesterol with diet and exercise.  Pt also thinks the lisinopril is causing the dry cough he has had.  Not productive.   Review of Systems  Constitutional: Negative.   HENT: Negative for congestion, ear pain, hearing loss, nosebleeds, postnasal drip, rhinorrhea, sinus pressure, sneezing and tinnitus.   Eyes: Negative for photophobia, discharge, itching and visual disturbance.  Respiratory: Negative.   Cardiovascular: Negative.   Gastrointestinal: Negative for abdominal pain, constipation, blood in stool, abdominal distention and anal bleeding.  Endocrine: Negative.   Genitourinary: Negative.   Musculoskeletal: Negative.   Skin: Negative.   Allergic/Immunologic: Negative.   Neurological: Negative for dizziness, weakness, light-headedness, numbness and headaches.  Psychiatric/Behavioral: Negative for suicidal ideas, confusion, sleep disturbance, dysphoric mood, decreased concentration and agitation. The patient is not nervous/anxious.     History Past Medical History  Diagnosis Date  . Hyperlipidemia   . H/O echocardiogram 2010    seen by Dr. Stanford Haney- told to return as needed   . Hypertension     Dr, Shane Haney told pt. in 2010, ? extra beats.  . Dysrhythmia     "irregular at one time" (07/21/2013)  . Snores   . Osteoarthritis     "knees and wrists" (07/21/2013)  . Neuromuscular disorder ([place])     spinal cord compression, carpal tunnel - R hand  . Left 3 fingers remains with some tingling.    He has past surgical history that includes Anterior cruciate ligament repair (Left, 1999); Knee arthroscopy (Right, 2007); Open reduction internal fixation (orif) foot lisfranc fracture (Right, 1993); Tonsillectomy; Carpal tunnel release (Left, 05/2013); Anterior cervical  decomp/discectomy fusion (N/A, 07/08/2013); Carpal tunnel release (Right, 07/08/2013); Incision and drainage of wound (N/A, 07/21/2013); and Total knee arthroplasty (Left, 04/11/2014).   His family history includes Alcohol abuse in his father; Aneurysm in his mother; HIV in his brother; Hypertension in his father and mother; Stroke in his father. There is no history of Colon cancer or Colon polyps.He reports that he has never smoked. He has never used smokeless tobacco. He reports that he drinks about 2.4 oz of alcohol per week. He reports that he does not use illicit drugs.  Current Outpatient Prescriptions on File Prior to Visit  Medication Sig Dispense Refill  . Multiple Vitamin (MULTIVITAMIN WITH MINERALS) TABS tablet Take 1 tablet by mouth daily.    . simvastatin (ZOCOR) 20 MG tablet Take 1 tablet (20 mg total) by mouth at bedtime. (Patient not taking: Reported on 04/29/2016) 30 tablet 2   No current facility-administered medications on file prior to visit.     Objective:  Objective Physical Exam  Constitutional: He is oriented to person, place, and time. He appears well-developed and well-nourished. No distress.  HENT:  Head: Normocephalic and atraumatic.  Right Ear: External ear normal.  Left Ear: External ear normal.  Nose: Nose normal.  Mouth/Throat: Oropharynx is clear and moist. No oropharyngeal exudate.  Eyes: Conjunctivae and EOM are normal. Pupils are equal, round, and reactive to light. Right eye exhibits no discharge. Left eye exhibits no discharge.  Neck: Normal range of motion. Neck supple. No JVD present. No thyromegaly present.  Cardiovascular: Normal rate, regular rhythm and intact distal pulses.  Exam reveals no gallop and no friction rub.  No murmur heard. Pulmonary/Chest: Effort normal and breath sounds normal. No respiratory distress. He has no wheezes. He has no rales. He exhibits no tenderness.  Abdominal: Soft. Bowel sounds are normal. He exhibits no distension and  no mass. There is no tenderness. There is no rebound and no guarding.  Genitourinary: Rectum normal, prostate normal and penis normal. Guaiac negative stool.  Musculoskeletal: Normal range of motion. He exhibits no edema or tenderness.  Lymphadenopathy:    He has no cervical adenopathy.  Neurological: He is alert and oriented to person, place, and time. He displays normal reflexes. He exhibits normal muscle tone.  Skin: Skin is warm and dry. No rash noted. He is not diaphoretic. No erythema. No pallor.  Psychiatric: He has a normal mood and affect. His behavior is normal. Judgment and thought content normal.  Nursing note and vitals reviewed.  BP 109/75 mmHg  Pulse 66  Temp(Src) 98.3 F (36.8 C) (Oral)  Ht 5\' 10"  (1.778 m)  Wt 281 lb 12.8 oz (127.824 kg)  BMI 40.43 kg/m2  SpO2 96% Wt Readings from Last 3 Encounters:  04/29/16 281 lb 12.8 oz (127.824 kg)  12/05/15 272 lb (123.378 kg)  09/06/15 275 lb 3.2 oz (124.83 kg)     Lab Results  Component Value Date   WBC 7.6 12/05/2014   HGB 15.8 12/05/2014   HCT 45.7 12/05/2014   PLT 267.0 12/05/2014   GLUCOSE 89 12/05/2014   CHOL 224* 12/05/2014   TRIG 95.0 12/05/2014   HDL 49.40 12/05/2014   LDLDIRECT 159.5 12/09/2011   LDLCALC 156* 12/05/2014   ALT 26 12/05/2014   AST 31 12/05/2014   NA 139 12/05/2014   K 4.5 12/05/2014   CL 103 12/05/2014   CREATININE 1.04 12/05/2014   BUN 11 12/05/2014   CO2 28 12/05/2014   TSH 1.38 12/05/2014   PSA 0.84 12/05/2014   INR 0.99 04/05/2014   HGBA1C 5.5 06/04/2013    No results found.   Assessment & Plan:  Plan I have discontinued [name] benzonatate, amoxicillin-clavulanate, and lisinopril. I am also having him start on losartan and hydrocortisone. Additionally, I am having him maintain his multivitamin with minerals and simvastatin.  Meds ordered this encounter  Medications  . losartan (COZAAR) 50 MG tablet    Sig: Take 1 tablet (50 mg total) by mouth daily.    Dispense:   30 tablet    Refill:  2  . hydrocortisone (ANUSOL-HC) 25 MG suppository    Sig: Place 1 suppository (25 mg total) rectally 2 (two) times daily.    Dispense:  12 suppository    Refill:  0    Problem List Items Addressed This Visit      Unprioritized   HYPERTENSION, BENIGN ESSENTIAL    D/c lisinopril secondary to cough and change to cozaar       Relevant Medications   losartan (COZAAR) 50 MG tablet   Preventative health care - Primary    Check labs ghm utd See avs      Relevant Orders   TSH   PSA   POCT urinalysis dipstick (Completed)   Lipid panel   CBC with Differential/Platelet   Comprehensive metabolic panel    Other Visit Diagnoses    Essential hypertension        Relevant Medications    losartan (COZAAR) 50 MG tablet    Other Relevant Orders    TSH    PSA    POCT urinalysis dipstick (Completed)    Lipid  panel    CBC with Differential/Platelet    Comprehensive metabolic panel    Hyperlipidemia LDL goal <100        Relevant Medications    losartan (COZAAR) 50 MG tablet    Other Relevant Orders    Lipid panel    Comprehensive metabolic panel    Need for hepatitis C screening test        Relevant Orders    Hepatitis C antibody       Follow-up: Return in about 6 months (around 10/30/2016), or if symptoms worsen or fail to improve, for hypertension, hyperlipidemia.  Ann Held, DO

## 2016-04-29 NOTE — Assessment & Plan Note (Signed)
Check labs  ghm utd See avs 

## 2016-04-29 NOTE — Assessment & Plan Note (Signed)
D/c lisinopril secondary to cough and change to cozaar

## 2016-04-30 LAB — CBC WITH DIFFERENTIAL/PLATELET
BASOS PCT: 0.2 % (ref 0.0–3.0)
Basophils Absolute: 0 10*3/uL (ref 0.0–0.1)
EOS ABS: 0.2 10*3/uL (ref 0.0–0.7)
EOS PCT: 2.6 % (ref 0.0–5.0)
HCT: 47 % (ref 39.0–52.0)
Hemoglobin: 16.3 g/dL (ref 13.0–17.0)
LYMPHS ABS: 2.2 10*3/uL (ref 0.7–4.0)
Lymphocytes Relative: 25.8 % (ref 12.0–46.0)
MCHC: 34.7 g/dL (ref 30.0–36.0)
MCV: 93.8 fl (ref 78.0–100.0)
MONO ABS: 0.6 10*3/uL (ref 0.1–1.0)
Monocytes Relative: 7.3 % (ref 3.0–12.0)
NEUTROS PCT: 64.1 % (ref 43.0–77.0)
Neutro Abs: 5.5 10*3/uL (ref 1.4–7.7)
PLATELETS: 273 10*3/uL (ref 150.0–400.0)
RBC: 5.01 Mil/uL (ref 4.22–5.81)
RDW: 13 % (ref 11.5–15.5)
WBC: 8.6 10*3/uL (ref 4.0–10.5)

## 2016-04-30 LAB — COMPREHENSIVE METABOLIC PANEL
ALT: 21 U/L (ref 0–53)
AST: 22 U/L (ref 0–37)
Albumin: 4.4 g/dL (ref 3.5–5.2)
Alkaline Phosphatase: 57 U/L (ref 39–117)
BUN: 11 mg/dL (ref 6–23)
CO2: 30 meq/L (ref 19–32)
Calcium: 9.7 mg/dL (ref 8.4–10.5)
Chloride: 103 mEq/L (ref 96–112)
Creatinine, Ser: 1.14 mg/dL (ref 0.40–1.50)
GFR: 71.39 mL/min (ref >60.00)
Glucose, Bld: 96 mg/dL (ref 70–99)
POTASSIUM: 4.8 meq/L (ref 3.5–5.1)
SODIUM: 138 meq/L (ref 135–145)
Total Bilirubin: 1 mg/dL (ref 0.2–1.2)
Total Protein: 6.3 g/dL (ref 6.0–8.3)

## 2016-04-30 LAB — LIPID PANEL
CHOLESTEROL: 219 mg/dL — AB (ref 0–200)
HDL: 44.8 mg/dL (ref >39.00)
LDL CALC: 152 mg/dL — AB (ref 0–99)
NONHDL: 173.89
Total CHOL/HDL Ratio: 5
Triglycerides: 111 mg/dL (ref 0.0–149.0)
VLDL: 22.2 mg/dL (ref 0.0–40.0)

## 2016-04-30 LAB — TSH: TSH: 1.61 u[IU]/mL (ref 0.35–4.50)

## 2016-04-30 LAB — PSA: PSA: 0.72 ng/mL (ref 0.10–4.00)

## 2016-05-03 MED ORDER — SIMVASTATIN 20 MG PO TABS: 20.0000 mg | ORAL_TABLET | Freq: Every day | ORAL | Status: DC

## 2016-05-03 NOTE — Addendum Note (Signed)
Addended byDamita Dunnings D on: 05/03/2016 09:15 AM   Modules accepted: Orders, SmartSet

## 2016-05-25 ENCOUNTER — Other Ambulatory Visit: Payer: Self-pay | Admitting: Family Medicine

## 2016-05-27 NOTE — Telephone Encounter (Signed)
Lisinopril request was declined bc Losartan was giving by Lowne on 04/19/16.

## 2016-07-04 ENCOUNTER — Ambulatory Visit (INDEPENDENT_AMBULATORY_CARE_PROVIDER_SITE_OTHER): Payer: Managed Care, Other (non HMO) | Admitting: Family Medicine

## 2016-07-04 ENCOUNTER — Encounter: Payer: Self-pay | Admitting: Family Medicine

## 2016-07-04 VITALS — BP 124/80 | HR 62 | Temp 98.1°F | Ht 70.0 in | Wt 288.6 lb

## 2016-07-04 DIAGNOSIS — H109 Unspecified conjunctivitis: Secondary | ICD-10-CM

## 2016-07-04 MED ORDER — POLYMYXIN B-TRIMETHOPRIM 10000-0.1 UNIT/ML-% OP SOLN
1.0000 [drp] | OPHTHALMIC | 0 refills | Status: AC
Start: 2016-07-04 — End: 2016-07-11

## 2016-07-04 NOTE — Progress Notes (Signed)
Chief Complaint  Patient presents with  . Eye Problem    (R) swollen,pain and red,also (L) itching-sxs started last night.    Shane Haney is here for bilateral eye irritation.   Duration: 1 day Chemical exposure? No  Recent URI? No  Contact lenses? Yes  History of allergies? No  Treatment to date: none  ROS:  Eyes: As noted above  Past Medical History:  Diagnosis Date  . Dysrhythmia    "irregular at one time" (07/21/2013)  . H/O echocardiogram 2010   seen by Dr. Stanford Breed- told to return as needed   . Hyperlipidemia   . Hypertension    Dr, Stanford Breed told pt. in 2010, ? extra beats.  . Neuromuscular disorder ([place])    spinal cord compression, carpal tunnel - R hand  . Left 3 fingers remains with some tingling.  . Osteoarthritis    "knees and wrists" (07/21/2013)  . Snores    Family History  Problem Relation Age of Onset  . Stroke Father   . Hypertension Father   . Alcohol abuse Father   . Hypertension Mother   . Aneurysm Mother     Brain  . HIV Brother     passed away from AIDS  . Colon cancer Neg Hx   . Colon polyps Neg Hx     BP 124/80 (BP Location: Left Arm, Patient Position: Sitting, Cuff Size: Large)   Pulse 62   Temp 98.1 F (36.7 C) (Oral)   Ht 5\' 10"  (1.778 m)   Wt 288 lb 9.6 oz (130.9 kg)   SpO2 97%   BMI 41.41 kg/m  Gen: Awake, alert, appears stated age Eyes: Upper R lid is swollen and with mild erythema, no TTP, otherwise lids normal, Sclera minimally injected b/l, PERRLA, EOMi Nose: Nares patent without discharge Mouth: MMM, pharynx without erythema or exudate Psych: Age appropriate judgment and insight; mood and affect normal  Bilateral conjunctivitis - Plan: trimethoprim-polymyxin b (POLYTRIM) ophthalmic solution  Orders as above. Instructed to practice good hand hygiene and try not to touch face. Warm compresses and artificial tears also recommended. F/u if no improvement in 7-10 days. Pt voiced understanding and agreement to the  plan.  Brenda, DO 07/04/16 9:21 AM

## 2016-07-04 NOTE — Patient Instructions (Addendum)

## 2016-07-04 NOTE — Progress Notes (Signed)
Pre visit review using our clinic review tool, if applicable. No additional management support is needed unless otherwise documented below in the visit note. 

## 2016-07-26 ENCOUNTER — Other Ambulatory Visit: Payer: Self-pay | Admitting: Family Medicine

## 2016-07-26 DIAGNOSIS — I10 Essential (primary) hypertension: Secondary | ICD-10-CM

## 2016-08-20 ENCOUNTER — Encounter: Payer: Self-pay | Admitting: Family

## 2016-08-20 ENCOUNTER — Ambulatory Visit (INDEPENDENT_AMBULATORY_CARE_PROVIDER_SITE_OTHER): Payer: Managed Care, Other (non HMO) | Admitting: Family

## 2016-08-20 VITALS — BP 138/85 | HR 66 | Temp 98.3°F | Resp 18 | Ht 70.0 in | Wt 288.4 lb

## 2016-08-20 DIAGNOSIS — J019 Acute sinusitis, unspecified: Secondary | ICD-10-CM | POA: Diagnosis not present

## 2016-08-20 DIAGNOSIS — Z23 Encounter for immunization: Secondary | ICD-10-CM

## 2016-08-20 MED ORDER — AMOXICILLIN-POT CLAVULANATE 875-125 MG PO TABS: 1.0000 | ORAL_TABLET | Freq: Two times a day (BID) | ORAL | 0 refills | Status: DC

## 2016-08-20 NOTE — Progress Notes (Signed)
Subjective:    Patient ID: Shane Haney, male    DOB: 1961/02/25, 55 y.o.   MRN: [id number]  HPI  Mr.  Shane Haney is a 55 yr old male who presents today with chief complaint of sinus congestion.  Symptoms began with a cold 8-9 days ago.  Reports associated cough.  Reports some frontal headaches. Denies known fever.  Energy is poor. Has some generalized achiness.  He has tried otc daytime sinus/cold prep, mucinex. Has been using daily x 5-6 days.    Review of Systems See HPI  Past Medical History:  Diagnosis Date  . Dysrhythmia    "irregular at one time" (07/21/2013)  . H/O echocardiogram 2010   seen by Dr. Stanford Breed- told to return as needed   . Hyperlipidemia   . Hypertension    Dr, Stanford Breed told pt. in 2010, ? extra beats.  . Neuromuscular disorder ([place])    spinal cord compression, carpal tunnel - R hand  . Left 3 fingers remains with some tingling.  . Osteoarthritis    "knees and wrists" (07/21/2013)  . Snores      Social History   Social History  . Marital status: Married    Spouse name: N/A  . Number of children: N/A  . Years of education: N/A   Occupational History  . teacher--- [employer]   Social History Main Topics  . Smoking status: Never Smoker  . Smokeless tobacco: Never Used  . Alcohol use 2.4 oz/week    2 Cans of beer, 2 Shots of liquor per week     Comment: 04-05-14 "3-5 mixed drinks or 6 beers on the weekends  . Drug use: No  . Sexual activity: Yes    Partners: Female   Other Topics Concern  . Not on file   Social History Narrative   Exercise-- 5-6 days a week    Past Surgical History:  Procedure Laterality Date  . ANTERIOR CERVICAL DECOMP/DISCECTOMY FUSION N/A 07/08/2013   Procedure: ANTERIOR CERVICAL DECOMPRESSION/DISCECTOMY FUSION 2 LEVELS C3-C5;  Surgeon: Melina Schools, MD;  Location: [place];  Service: Orthopedics;  Laterality: N/A;  . ANTERIOR CRUCIATE LIGAMENT REPAIR Left 1999  . CARPAL TUNNEL RELEASE Left 05/2013  .  CARPAL TUNNEL RELEASE Right 07/08/2013   Procedure: LIMITED OPEN RIGHT CARPAL TUNNEL RELEASE;  Surgeon: Roseanne Kaufman, MD;  Location: [place];  Service: Orthopedics;  Laterality: Right;  . INCISION AND DRAINAGE OF WOUND N/A 07/21/2013   Procedure: IRRIGATION AND DEBRIDEMENT OF CERVICAL WOUND;  Surgeon: Melina Schools, MD;  Location: [place];  Service: Orthopedics;  Laterality: N/A;  . KNEE ARTHROSCOPY Right 2007  . OPEN REDUCTION INTERNAL FIXATION (ORIF) FOOT LISFRANC FRACTURE Right 1993   R- /w ORIF  . TONSILLECTOMY    . TOTAL KNEE ARTHROPLASTY Left 04/11/2014   Procedure: LEFT TOTAL KNEE ARTHROPLASTY WITH HARDWARE REMOVAL;  Surgeon: [name], MD;  Location: [facility];  Service: Orthopedics;  Laterality: Left;    Family History  Problem Relation Age of Onset  . Stroke Father   . Hypertension Father   . Alcohol abuse Father   . Hypertension Mother   . Aneurysm Mother     Brain  . HIV Brother     passed away from AIDS  . Colon cancer Neg Hx   . Colon polyps Neg Hx     No Known Allergies  Current Outpatient Prescriptions on File Prior to Visit  Medication Sig Dispense Refill  . losartan (COZAAR) 50 MG tablet  TAKE 1 TABLET(50 MG) BY MOUTH DAILY 30 tablet 2  . Multiple Vitamin (MULTIVITAMIN WITH MINERALS) TABS tablet Take 1 tablet by mouth daily.    . simvastatin (ZOCOR) 20 MG tablet Take 1 tablet (20 mg total) by mouth at bedtime. 30 tablet 2   No current facility-administered medications on file prior to visit.     BP 138/85 (BP Location: Right Arm, Patient Position: Sitting, Cuff Size: Large)   Pulse 66   Temp 98.3 F (36.8 C) (Oral)   Resp 18   Ht 5\' 10"  (1.778 m)   Wt 288 lb 6.4 oz (130.8 kg)   SpO2 96% Comment: RA  BMI 41.38 kg/m       Objective:   Physical Exam  Constitutional: He is oriented to person, place, and time. He appears well-developed and well-nourished. No distress.  HENT:  Head: Normocephalic and atraumatic.  Right Ear: Tympanic membrane and ear  canal normal.  Left Ear: Tympanic membrane and ear canal normal.  Nose: Right sinus exhibits no maxillary sinus tenderness and no frontal sinus tenderness. Left sinus exhibits no maxillary sinus tenderness and no frontal sinus tenderness.  Mouth/Throat: No posterior oropharyngeal edema or posterior oropharyngeal erythema.  Cardiovascular: Normal rate and regular rhythm.   No murmur heard. Pulmonary/Chest: Effort normal and breath sounds normal. No respiratory distress. He has no wheezes. He has no rales.  Musculoskeletal: He exhibits no edema.  Neurological: He is alert and oriented to person, place, and time.  Skin: Skin is warm and dry.  Psychiatric: He has a normal mood and affect. His behavior is normal. Thought content normal.          Assessment & Plan:  Sinusitis- advised pt if not improved by tomorrow to begin augmentin. Also advised him to begin nasal saline irrigation. Call if new/worsening symptoms or if symptoms do not improve. Flu shot today

## 2016-08-20 NOTE — Progress Notes (Signed)
Pre visit review using our clinic review tool, if applicable. No additional management support is needed unless otherwise documented below in the visit note. 

## 2016-08-20 NOTE — Patient Instructions (Signed)
Please begin augmentin for sinus infection.   Sinusitis, Adult Sinusitis is redness, soreness, and inflammation of the paranasal sinuses. Paranasal sinuses are air pockets within the bones of your face. They are located beneath your eyes, in the middle of your forehead, and above your eyes. In healthy paranasal sinuses, mucus is able to drain out, and air is able to circulate through them by way of your nose. However, when your paranasal sinuses are inflamed, mucus and air can become trapped. This can allow bacteria and other germs to grow and cause infection. Sinusitis can develop quickly and last only a short time (acute) or continue over a long period (chronic). Sinusitis that lasts for more than 12 weeks is considered chronic. CAUSES Causes of sinusitis include:  Allergies.  Structural abnormalities, such as displacement of the cartilage that separates your nostrils (deviated septum), which can decrease the air flow through your nose and sinuses and affect sinus drainage.  Functional abnormalities, such as when the small hairs (cilia) that line your sinuses and help remove mucus do not work properly or are not present. SIGNS AND SYMPTOMS Symptoms of acute and chronic sinusitis are the same. The primary symptoms are pain and pressure around the affected sinuses. Other symptoms include:  Upper toothache.  Earache.  Headache.  Bad breath.  Decreased sense of smell and taste.  A cough, which worsens when you are lying flat.  Fatigue.  Fever.  Thick drainage from your nose, which often is green and may contain pus (purulent).  Swelling and warmth over the affected sinuses. DIAGNOSIS Your health care provider will perform a physical exam. During your exam, your health care provider may perform any of the following to help determine if you have acute sinusitis or chronic sinusitis:  Look in your nose for signs of abnormal growths in your nostrils (nasal polyps).  Tap over the  affected sinus to check for signs of infection.  View the inside of your sinuses using an imaging device that has a light attached (endoscope). If your health care provider suspects that you have chronic sinusitis, one or more of the following tests may be recommended:  Allergy tests.  Nasal culture. A sample of mucus is taken from your nose, sent to a lab, and screened for bacteria.  Nasal cytology. A sample of mucus is taken from your nose and examined by your health care provider to determine if your sinusitis is related to an allergy. TREATMENT Most cases of acute sinusitis are related to a viral infection and will resolve on their own within 10 days. Sometimes, medicines are prescribed to help relieve symptoms of both acute and chronic sinusitis. These may include pain medicines, decongestants, nasal steroid sprays, or saline sprays. However, for sinusitis related to a bacterial infection, your health care provider will prescribe antibiotic medicines. These are medicines that will help kill the bacteria causing the infection. Rarely, sinusitis is caused by a fungal infection. In these cases, your health care provider will prescribe antifungal medicine. For some cases of chronic sinusitis, surgery is needed. Generally, these are cases in which sinusitis recurs more than 3 times per year, despite other treatments. HOME CARE INSTRUCTIONS  Drink plenty of water. Water helps thin the mucus so your sinuses can drain more easily.  Use a humidifier.  Inhale steam 3-4 times a day (for example, sit in the bathroom with the shower running).  Apply a warm, moist washcloth to your face 3-4 times a day, or as directed by your health care  provider.  Use saline nasal sprays to help moisten and clean your sinuses.  Take medicines only as directed by your health care provider.  If you were prescribed either an antibiotic or antifungal medicine, finish it all even if you start to feel better. SEEK  IMMEDIATE MEDICAL CARE IF:  You have increasing pain or severe headaches.  You have nausea, vomiting, or drowsiness.  You have swelling around your face.  You have vision problems.  You have a stiff neck.  You have difficulty breathing.   This information is not intended to replace advice given to you by your health care provider. Make sure you discuss any questions you have with your health care provider.   Document Released: 09/30/2005 Document Revised: 10/21/2014 Document Reviewed: 10/15/2011 Elsevier Interactive Patient Education [employer].

## 2016-09-11 ENCOUNTER — Ambulatory Visit: Payer: Managed Care, Other (non HMO) | Admitting: Family Medicine

## 2016-09-12 ENCOUNTER — Ambulatory Visit: Payer: Managed Care, Other (non HMO) | Admitting: Family Medicine

## 2016-12-08 ENCOUNTER — Other Ambulatory Visit: Payer: Self-pay | Admitting: Family Medicine

## 2016-12-08 DIAGNOSIS — I10 Essential (primary) hypertension: Secondary | ICD-10-CM

## 2017-02-03 ENCOUNTER — Ambulatory Visit (INDEPENDENT_AMBULATORY_CARE_PROVIDER_SITE_OTHER): Payer: 59 | Admitting: Family Medicine

## 2017-02-03 ENCOUNTER — Encounter: Payer: Self-pay | Admitting: Family Medicine

## 2017-02-03 ENCOUNTER — Ambulatory Visit (HOSPITAL_BASED_OUTPATIENT_CLINIC_OR_DEPARTMENT_OTHER)
Admission: RE | Admit: 2017-02-03 | Discharge: 2017-02-03 | Disposition: A | Discharge status: Home / Self Care | Payer: 59 | Source: Ambulatory Visit | Attending: Family Medicine | Admitting: Family Medicine

## 2017-02-03 VITALS — BP 140/80 | HR 67 | Temp 98.0°F | Resp 16 | Ht 70.0 in | Wt 302.8 lb

## 2017-02-03 DIAGNOSIS — X58XXXA Exposure to other specified factors, initial encounter: Secondary | ICD-10-CM | POA: Insufficient documentation

## 2017-02-03 DIAGNOSIS — M542 Cervicalgia: Secondary | ICD-10-CM

## 2017-02-03 DIAGNOSIS — M5030 Other cervical disc degeneration, unspecified cervical region: Secondary | ICD-10-CM | POA: Insufficient documentation

## 2017-02-03 DIAGNOSIS — W19XXXA Unspecified fall, initial encounter: Secondary | ICD-10-CM

## 2017-02-03 NOTE — Progress Notes (Signed)
Pre visit review using our clinic review tool, if applicable. No additional management support is needed unless otherwise documented below in the visit note. 

## 2017-02-03 NOTE — Patient Instructions (Signed)
Cervical Sprain A cervical sprain is a stretch or tear in one or more of the tough, cord-like tissues that connect bones (ligaments) in the neck. Cervical sprains can range from mild to severe. Severe cervical sprains can cause the spinal bones (vertebrae) in the neck to be unstable. This can lead to spinal cord damage and can result in serious nervous system problems. The amount of time that it takes for a cervical sprain to get better depends on the cause and extent of the injury. Most cervical sprains heal in 4-6 weeks. What are the causes? Cervical sprains may be caused by an injury (trauma), such as from a motor vehicle accident, a fall, or sudden forward and backward whipping movement of the head and neck (whiplash injury). Mild cervical sprains may be caused by wear and tear over time, such as from poor posture, sitting in a chair that does not provide support, or looking up or down for long periods of time. What increases the risk? The following factors may make you more likely to develop this condition:  Participating in activities that have a high risk of trauma to the neck. These include contact sports, auto racing, gymnastics, and diving.  Taking risks when driving or riding in a motor vehicle, such as speeding.  Having osteoarthritis of the spine.  Having poor strength and flexibility of the neck.  A previous neck injury.  Having poor posture.  Spending a lot of time in certain positions that put stress on the neck, such as sitting at a computer for long periods of time. What are the signs or symptoms? Symptoms of this condition include:  Pain, soreness, stiffness, tenderness, swelling, or a burning sensation in the front, back, or sides of the neck.  Sudden tightening of neck muscles that you cannot control (muscle spasms).  Pain in the shoulders or upper back.  Limited ability to move the neck.  Headache.  Dizziness.  Nausea.  Vomiting.  Weakness, numbness, or  tingling in a hand or an arm. Symptoms may develop right away after injury, or they may develop over a few days. In some cases, symptoms may go away with treatment and return (recur) over time. How is this diagnosed? This condition may be diagnosed based on:  Your medical history.  Your symptoms.  Any recent injuries or known neck problems that you have, such as arthritis in the neck.  A physical exam.  Imaging tests, such as:  X-rays.  MRI.  CT scan. How is this treated? This condition is treated by resting and icing the injured area and doing physical therapy exercises. Depending on the severity of your condition, treatment may also include:  Keeping your neck in place (immobilized) for periods of time. This may be done using:  A cervical collar. This supports your chin and the back of your head.  A cervical traction device. This is a sling that holds up your head. This removes weight and pressure from your neck, and it may help to relieve pain.  Medicines that help to relieve pain and inflammation.  Medicines that help to relax your muscles (muscle relaxants).  Surgery. This is rare. Follow these instructions at home: If you have a cervical collar:   Wear it as told by your health care provider. Do not remove the collar unless instructed by your health care provider.  Ask your health care provider before you make any adjustments to your collar.  If you have long hair, keep it outside of the collar.    Ask your health care provider if you can remove the collar for cleaning and bathing. If you are allowed to remove the collar for cleaning or bathing:  Follow instructions from your health care provider about how to remove the collar safely.  Clean the collar by wiping it with mild soap and water and drying it completely.  If your collar has removable pads, remove them every 1-2 days and wash them by hand with soap and water. Let them air-dry completely before you put  them back in the collar.  Check your skin under the collar for irritation or sores. If you see any, tell your health care provider. Managing pain, stiffness, and swelling   If directed, use a cervical traction device as told by your health care provider.  If directed, apply heat to the affected area before you do your physical therapy or as often as told by your health care provider. Use the heat source that your health care provider recommends, such as a moist heat pack or a heating pad.  Place a towel between your skin and the heat source.  Leave the heat on for 20-30 minutes.  Remove the heat if your skin turns bright red. This is especially important if you are unable to feel pain, heat, or cold. You may have a greater risk of getting burned.  If directed, put ice on the affected area:  Put ice in a plastic bag.  Place a towel between your skin and the bag.  Leave the ice on for 20 minutes, 2-3 times a day. Activity   Do not drive while wearing a cervical collar. If you do not have a cervical collar, ask your health care provider if it is safe to drive while your neck heals.  Do not drive or use heavy machinery while taking prescription pain medicine or muscle relaxants, unless your health care provider approves.  Do not lift anything that is heavier than 10 lb (4.5 kg) until your health care provider tells you that it is safe.  Rest as directed by your health care provider. Avoid positions and activities that make your symptoms worse. Ask your health care provider what activities are safe for you.  If physical therapy was prescribed, do exercises as told by your health care provider or physical therapist. General instructions   Take over-the-counter and prescription medicines only as told by your health care provider.  Do not use any products that contain nicotine or tobacco, such as cigarettes and e-cigarettes. These can delay healing. If you need help quitting, ask your  health care provider.  Keep all follow-up visits as told by your health care provider or physical therapist. This is important. How is this prevented? To prevent a cervical sprain from happening again:  Use and maintain good posture. Make any needed adjustments to your workstation to help you use good posture.  Exercise regularly as directed by your health care provider or physical therapist.  Avoid risky activities that may cause a cervical sprain. Contact a health care provider if:  You have symptoms that get worse or do not get better after 2 weeks of treatment.  You have pain that gets worse or does not get better with medicine.  You develop new, unexplained symptoms.  You have sores or irritated skin on your neck from wearing your cervical collar. Get help right away if:  You have severe pain.  You develop numbness, tingling, or weakness in any part of your body.  You cannot move   a part of your body (you have paralysis).  You have neck pain along with:  Severe dizziness.  Headache. Summary  A cervical sprain is a stretch or tear in one or more of the tough, cord-like tissues that connect bones (ligaments) in the neck.  Cervical sprains may be caused by an injury (trauma), such as from a motor vehicle accident, a fall, or sudden forward and backward whipping movement of the head and neck (whiplash injury).  Symptoms may develop right away after injury, or they may develop over a few days.  This condition is treated by resting and icing the injured area and doing physical therapy exercises. This information is not intended to replace advice given to you by your health care provider. Make sure you discuss any questions you have with your health care provider. Document Released: 07/28/2007 Document Revised: 05/29/2016 Document Reviewed: 05/29/2016 Elsevier Interactive Patient Education  2017 Else[employer] Inc[employer]  

## 2017-02-03 NOTE — Progress Notes (Signed)
Patient ID: Shane Haney, male   DOB: 10/06/1961, 56 y.o.   MRN: [id number]     Subjective:  I acted as a Education administrator for Dr. Carollee Herter.  Shane Haney, [name]   Patient ID: Shane Haney, male    DOB: [birth year], 56 y.o.   MRN: [id number]  Chief Complaint  Patient presents with  . Fall    Fall  Incident onset: fell about 8 days ago. Fall occurred: fell while sitting down in chair and fell backwards and rolled down 15 ft hill. He landed on grass. The pain is present in the neck. Pain scale: 3-4 out of 10. The symptoms are aggravated by rotation and movement. Pertinent negatives include no fever, headaches, loss of consciousness, tingling or vomiting. He has tried ice, acetaminophen and NSAID for the symptoms. The treatment provided mild relief.    Patient is in today for fall.  Has no numbness or tingling.  Patient Care Team: Ann Held, DO as PCP - General   Past Medical History:  Diagnosis Date  . Dysrhythmia    "irregular at one time" (07/21/2013)  . H/O echocardiogram 2010   seen by Dr. Stanford Breed- told to return as needed   . Hyperlipidemia   . Hypertension    Dr, Stanford Breed told pt. in 2010, ? extra beats.  . Neuromuscular disorder ([place])    spinal cord compression, carpal tunnel - R hand  . Left 3 fingers remains with some tingling.  . Osteoarthritis    "knees and wrists" (07/21/2013)  . Snores     Past Surgical History:  Procedure Laterality Date  . ANTERIOR CERVICAL DECOMP/DISCECTOMY FUSION N/A 07/08/2013   Procedure: ANTERIOR CERVICAL DECOMPRESSION/DISCECTOMY FUSION 2 LEVELS C3-C5;  Surgeon: Melina Schools, MD;  Location: [facility];  Service: Orthopedics;  Laterality: N/A;  . ANTERIOR CRUCIATE LIGAMENT REPAIR Left 1999  . CARPAL TUNNEL RELEASE Left 05/2013  . CARPAL TUNNEL RELEASE Right 07/08/2013   Procedure: LIMITED OPEN RIGHT CARPAL TUNNEL RELEASE;  Surgeon: Roseanne Kaufman, MD;  Location: [facility];  Service: Orthopedics;  Laterality: Right;  . INCISION AND DRAINAGE OF  WOUND N/A 07/21/2013   Procedure: IRRIGATION AND DEBRIDEMENT OF CERVICAL WOUND;  Surgeon: Melina Schools, MD;  Location: [facility];  Service: Orthopedics;  Laterality: N/A;  . KNEE ARTHROSCOPY Right 2007  . OPEN REDUCTION INTERNAL FIXATION (ORIF) FOOT LISFRANC FRACTURE Right 1993   R- /w ORIF  . TONSILLECTOMY    . TOTAL KNEE ARTHROPLASTY Left 04/11/2014   Procedure: LEFT TOTAL KNEE ARTHROPLASTY WITH HARDWARE REMOVAL;  Surgeon: [name], MD;  Location: [facility];  Service: Orthopedics;  Laterality: Left;    Family History  Problem Relation Age of Onset  . Stroke Father   . Hypertension Father   . Alcohol abuse Father   . Hypertension Mother   . Aneurysm Mother     Brain  . HIV Brother     passed away from AIDS  . Colon cancer Neg Hx   . Colon polyps Neg Hx     Social History   Social History  . Marital status: Married    Spouse name: N/A  . Number of children: N/A  . Years of education: N/A   Occupational History  . 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 Smoking status: Never Smoker  . Smokeless tobacco: Never Used  . Alcohol use 2.4 oz/week    2 Cans of beer, 2 Shots of liquor per week  Comment: 04-05-14 "3-5 mixed drinks or 6 beers on the weekends  . Drug use: No  . Sexual activity: Yes    Partners: Female   Other Topics Concern  . Not on file   Social History Narrative   Exercise-- 5-6 days a week    Outpatient Medications Prior to Visit  Medication Sig Dispense Refill  . losartan (COZAAR) 50 MG tablet TAKE 1 TABLET(50 MG) BY MOUTH DAILY 30 tablet 2  . Multiple Vitamin (MULTIVITAMIN WITH MINERALS) TABS tablet Take 1 tablet by mouth daily.    . simvastatin (ZOCOR) 20 MG tablet Take 1 tablet (20 mg total) by mouth at bedtime. 30 tablet 2  . amoxicillin-clavulanate (AUGMENTIN) 875-125 MG tablet Take 1 tablet by mouth 2 (two) times daily. 20 tablet 0   No facility-administered medications prior to visit.     No Known  Allergies  Review of Systems  Constitutional: Negative for fever and malaise/fatigue.  HENT: Negative for congestion.   Eyes: Negative for blurred vision.  Respiratory: Negative for cough and shortness of breath.   Cardiovascular: Negative for chest pain, palpitations and leg swelling.  Gastrointestinal: Negative for vomiting.  Musculoskeletal: Positive for neck pain. Negative for back pain.  Skin: Negative for rash.  Neurological: Negative for tingling, loss of consciousness and headaches.       Objective:    Physical Exam  Constitutional: He appears well-developed and well-nourished. No distress.  HENT:  Head: Normocephalic and atraumatic.  Eyes: Conjunctivae are normal.  Neck: Normal range of motion. No thyromegaly present.  Cardiovascular: Normal rate and regular rhythm.   Pulmonary/Chest: Effort normal. He has no wheezes.  Abdominal: Soft. Bowel sounds are normal. There is no tenderness.  Musculoskeletal: Normal range of motion. He exhibits no edema or deformity.  Neurological: He is alert.  Skin: Skin is warm and dry. He is not diaphoretic.  Psychiatric: He has a normal mood and affect.    BP 140/80   Pulse 67   Temp 98 F (36.7 C) (Oral)   Resp 16   Ht 5\' 10"  (1.778 m)   Wt (!) 302 lb 12.8 oz (137.3 kg)   SpO2 96%   BMI 43.45 kg/m  Wt Readings from Last 3 Encounters:  02/03/17 (!) 302 lb 12.8 oz (137.3 kg)  08/20/16 288 lb 6.4 oz (130.8 kg)  07/04/16 288 lb 9.6 oz (130.9 kg)   BP Readings from Last 3 Encounters:  02/03/17 140/80  08/20/16 138/85  07/04/16 124/80     Immunization History  Administered Date(s) Administered  . Influenza Whole 10/12/2010  . Influenza,inj,Quad PF,36+ Mos 12/05/2014, 08/20/2016  . Influenza-Unspecified 07/09/2013  . Td 10/12/2010    Health Maintenance  Topic Date Due  . HIV Screening  04/29/2017 (Originally 03/18/1978)  . INFLUENZA VACCINE  05/14/2017  . COLONOSCOPY  05/29/2020  . TETANUS/TDAP  10/12/2020  . Hepatitis  C Screening  Completed    Lab Results  Component Value Date   WBC 8.6 04/29/2016   HGB 16.3 04/29/2016   HCT 47.0 04/29/2016   PLT 273.0 04/29/2016   GLUCOSE 96 04/29/2016   CHOL 219 (H) 04/29/2016   TRIG 111.0 04/29/2016   HDL 44.80 04/29/2016   LDLDIRECT 159.5 12/09/2011   LDLCALC 152 (H) 04/29/2016   ALT 21 04/29/2016   AST 22 04/29/2016   NA 138 04/29/2016   K 4.8 04/29/2016   CL 103 04/29/2016   CREATININE 1.14 04/29/2016   BUN 11 04/29/2016   CO2 30 04/29/2016   TSH  1.61 04/29/2016   PSA 0.72 04/29/2016   INR 0.99 04/05/2014   HGBA1C 5.5 06/04/2013    Lab Results  Component Value Date   TSH 1.61 04/29/2016   Lab Results  Component Value Date   WBC 8.6 04/29/2016   HGB 16.3 04/29/2016   HCT 47.0 04/29/2016   MCV 93.8 04/29/2016   PLT 273.0 04/29/2016   Lab Results  Component Value Date   NA 138 04/29/2016   K 4.8 04/29/2016   CO2 30 04/29/2016   GLUCOSE 96 04/29/2016   BUN 11 04/29/2016   CREATININE 1.14 04/29/2016   BILITOT 1.0 04/29/2016   ALKPHOS 57 04/29/2016   AST 22 04/29/2016   ALT 21 04/29/2016   PROT 6.3 04/29/2016   ALBUMIN 4.4 04/29/2016   CALCIUM 9.7 04/29/2016   GFR 71.39 04/29/2016   Lab Results  Component Value Date   CHOL 219 (H) 04/29/2016   Lab Results  Component Value Date   HDL 44.80 04/29/2016   Lab Results  Component Value Date   LDLCALC 152 (H) 04/29/2016   Lab Results  Component Value Date   TRIG 111.0 04/29/2016   Lab Results  Component Value Date   CHOLHDL 5 04/29/2016   Lab Results  Component Value Date   HGBA1C 5.5 06/04/2013         Assessment & Plan:   Problem List Items Addressed This Visit    None    Visit Diagnoses    Neck pain    -  Primary   Relevant Orders   DG Cervical Spine 2 or 3 views (Completed)   Fall, initial encounter       Relevant Orders   DG Cervical Spine 2 or 3 views (Completed)    check c spine xray Consider referral back to ortho  I have discontinued Mr.  Migliaccio amoxicillin-clavulanate. I am also having him maintain his multivitamin with minerals, simvastatin, and losartan.  No orders of the defined types were placed in this encounter.   CMA served as Education administrator during this visit. History, Physical and Plan performed by medical provider. Documentation and orders reviewed and attested to.  Ann Held, DO

## 2017-03-07 ENCOUNTER — Encounter: Payer: Self-pay | Admitting: Family Medicine

## 2017-03-07 ENCOUNTER — Ambulatory Visit (INDEPENDENT_AMBULATORY_CARE_PROVIDER_SITE_OTHER): Payer: 59 | Admitting: Family Medicine

## 2017-03-07 VITALS — BP 130/88 | HR 68 | Temp 98.1°F | Resp 16 | Ht 70.0 in | Wt 300.0 lb

## 2017-03-07 DIAGNOSIS — B351 Tinea unguium: Secondary | ICD-10-CM

## 2017-03-07 DIAGNOSIS — L600 Ingrowing nail: Secondary | ICD-10-CM | POA: Diagnosis not present

## 2017-03-07 MED ORDER — EFINACONAZOLE 10 % EX SOLN: CUTANEOUS | 5 refills | Status: DC

## 2017-03-07 NOTE — Progress Notes (Signed)
Patient ID: Shane Haney, male   DOB: [birth year], 56 y.o.   MRN: [id number]     Subjective:  I acted as a Education administrator for Dr. Carollee Herter.  Shane Haney, Shane Haney   Patient ID: Shane Haney, male    DOB: 02/23/1961, 56 y.o.   MRN: [id number]  Chief Complaint  Patient presents with  . Nail Problem    right great toe    HPI  Patient is in today for toenail fungus on right great toe.  He is having pain for about 2 months now.  He has had a history of toe fungus on same toe but has been untreated for about 2-3 years.    Patient Care Team: Carollee Herter, Alferd Apa, DO as PCP - General   Past Medical History:  Diagnosis Date  . Dysrhythmia    "irregular at one time" (07/21/2013)  . H/O echocardiogram 2010   seen by Dr. Stanford Breed- told to return as needed   . Hyperlipidemia   . Hypertension    Dr, Stanford Breed told pt. in 2010, ? extra beats.  . Neuromuscular disorder ([place])    spinal cord compression, carpal tunnel - R hand  . Left 3 fingers remains with some tingling.  . Osteoarthritis    "knees and wrists" (07/21/2013)  . Snores     Past Surgical History:  Procedure Laterality Date  . ANTERIOR CERVICAL DECOMP/DISCECTOMY FUSION N/A 07/08/2013   Procedure: ANTERIOR CERVICAL DECOMPRESSION/DISCECTOMY FUSION 2 LEVELS C3-C5;  Surgeon: Melina Schools, MD;  Location: [facility];  Service: Orthopedics;  Laterality: N/A;  . ANTERIOR CRUCIATE LIGAMENT REPAIR Left 1999  . CARPAL TUNNEL RELEASE Left 05/2013  . CARPAL TUNNEL RELEASE Right 07/08/2013   Procedure: LIMITED OPEN RIGHT CARPAL TUNNEL RELEASE;  Surgeon: Roseanne Kaufman, MD;  Location: [facility];  Service: Orthopedics;  Laterality: Right;  . INCISION AND DRAINAGE OF WOUND N/A 07/21/2013   Procedure: IRRIGATION AND DEBRIDEMENT OF CERVICAL WOUND;  Surgeon: Melina Schools, MD;  Location: [facility];  Service: Orthopedics;  Laterality: N/A;  . KNEE ARTHROSCOPY Right 2007  . OPEN REDUCTION INTERNAL FIXATION (ORIF) FOOT LISFRANC FRACTURE Right 1993   R- /w ORIF  .  TONSILLECTOMY    . TOTAL KNEE ARTHROPLASTY Left 04/11/2014   Procedure: LEFT TOTAL KNEE ARTHROPLASTY WITH HARDWARE REMOVAL;  Surgeon: [name], MD;  [facility];  Service: Orthopedics;  Laterality: Left;    Family History  Problem Relation Age of Onset  . Stroke Father   . Hypertension Father   . Alcohol abuse Father   . Hypertension Mother   . Aneurysm Mother        Brain  . HIV Brother        passed away from AIDS  . Colon cancer Neg Hx   . Colon polyps Neg Hx     Social History   Social History  . Marital status: Married    Spouse name: N/A  . Number of children: N/A  . Years of education: N/A   Occupational History  . 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 Smoking status: Never Smoker  . Smokeless tobacco: Never Used  . Alcohol use 2.4 oz/week    2 Cans of beer, 2 Shots of liquor per week     Comment: 04-05-14 "3-5 mixed drinks or 6 beers on the weekends  . Drug use: No  . Sexual activity: Yes    Partners: Female   Other Topics Concern  . Not on  file   Social History Narrative   Exercise-- 5-6 days a week    Outpatient Medications Prior to Visit  Medication Sig Dispense Refill  . losartan (COZAAR) 50 MG tablet TAKE 1 TABLET(50 MG) BY MOUTH DAILY 30 tablet 2  . Multiple Vitamin (MULTIVITAMIN WITH MINERALS) TABS tablet Take 1 tablet by mouth daily.    . simvastatin (ZOCOR) 20 MG tablet Take 1 tablet (20 mg total) by mouth at bedtime. 30 tablet 2   No facility-administered medications prior to visit.     No Known Allergies  Review of Systems  Constitutional: Negative for fever and malaise/fatigue.  HENT: Negative for congestion.   Eyes: Negative for blurred vision.  Respiratory: Negative for cough and shortness of breath.   Cardiovascular: Negative for chest pain, palpitations and leg swelling.  Gastrointestinal: Negative for vomiting.  Musculoskeletal: Negative for back pain.       Right great toe pain     Skin: Negative for rash.  Neurological: Negative for loss of consciousness and headaches.       Objective:    Physical Exam  Constitutional: He appears well-developed and well-nourished. No distress.  HENT:  Head: Normocephalic and atraumatic.  Eyes: Conjunctivae are normal.  Neck: Normal range of motion. No thyromegaly present.  Cardiovascular: Normal rate and regular rhythm.   Pulmonary/Chest: Effort normal. He has no wheezes.  Abdominal: Soft. Bowel sounds are normal. There is no tenderness.  Musculoskeletal: Normal range of motion. He exhibits no edema or deformity.  Neurological: He is alert.  Skin: Skin is warm and dry. He is not diaphoretic.  Psychiatric: He has a normal mood and affect.    BP 130/88 (BP Location: Right Arm, Cuff Size: Large)   Pulse 68   Temp 98.1 F (36.7 C) (Oral)   Resp 16   Ht 5\' 10"  (1.778 m)   Wt 300 lb (136.1 kg)   SpO2 95%   BMI 43.05 kg/m  Wt Readings from Last 3 Encounters:  03/07/17 300 lb (136.1 kg)  02/03/17 (!) 302 lb 12.8 oz (137.3 kg)  08/20/16 288 lb 6.4 oz (130.8 kg)   BP Readings from Last 3 Encounters:  03/07/17 130/88  02/03/17 140/80  08/20/16 138/85     Immunization History  Administered Date(s) Administered  . Influenza Whole 10/12/2010  . Influenza,inj,Quad PF,36+ Mos 12/05/2014, 08/20/2016  . Influenza-Unspecified 07/09/2013  . Td 10/12/2010    Health Maintenance  Topic Date Due  . HIV Screening  04/29/2017 (Originally 03/18/1978)  . INFLUENZA VACCINE  05/14/2017  . COLONOSCOPY  05/29/2020  . TETANUS/TDAP  10/12/2020  . Hepatitis C Screening  Completed    Lab Results  Component Value Date   WBC 8.6 04/29/2016   HGB 16.3 04/29/2016   HCT 47.0 04/29/2016   PLT 273.0 04/29/2016   GLUCOSE 96 04/29/2016   CHOL 219 (H) 04/29/2016   TRIG 111.0 04/29/2016   HDL 44.80 04/29/2016   LDLDIRECT 159.5 12/09/2011   LDLCALC 152 (H) 04/29/2016   ALT 21 04/29/2016   AST 22 04/29/2016   NA 138 04/29/2016   K 4.8  04/29/2016   CL 103 04/29/2016   CREATININE 1.14 04/29/2016   BUN 11 04/29/2016   CO2 30 04/29/2016   TSH 1.61 04/29/2016   PSA 0.72 04/29/2016   INR 0.99 04/05/2014   HGBA1C 5.5 06/04/2013    Lab Results  Component Value Date   TSH 1.61 04/29/2016   Lab Results  Component Value Date   WBC 8.6 04/29/2016  HGB 16.3 04/29/2016   HCT 47.0 04/29/2016   MCV 93.8 04/29/2016   PLT 273.0 04/29/2016   Lab Results  Component Value Date   NA 138 04/29/2016   K 4.8 04/29/2016   CO2 30 04/29/2016   GLUCOSE 96 04/29/2016   BUN 11 04/29/2016   CREATININE 1.14 04/29/2016   BILITOT 1.0 04/29/2016   ALKPHOS 57 04/29/2016   AST 22 04/29/2016   ALT 21 04/29/2016   PROT 6.3 04/29/2016   ALBUMIN 4.4 04/29/2016   CALCIUM 9.7 04/29/2016   GFR 71.39 04/29/2016   Lab Results  Component Value Date   CHOL 219 (H) 04/29/2016   Lab Results  Component Value Date   HDL 44.80 04/29/2016   Lab Results  Component Value Date   LDLCALC 152 (H) 04/29/2016   Lab Results  Component Value Date   TRIG 111.0 04/29/2016   Lab Results  Component Value Date   CHOLHDL 5 04/29/2016   Lab Results  Component Value Date   HGBA1C 5.5 06/04/2013         Assessment & Plan:   Problem List Items Addressed This Visit    None    Visit Diagnoses    Onychomycosis of right great toe    -  Primary   Relevant Medications   Efinaconazole (JUBLIA) 10 % SOLN   Other Relevant Orders   Ambulatory referral to Podiatry   Ingrown toenail       Relevant Orders   Ambulatory referral to Podiatry      I am having Mr. Valverde start on Efinaconazole. I am also having him maintain his multivitamin with minerals, simvastatin, and losartan.  Meds ordered this encounter  Medications  . Efinaconazole (JUBLIA) 10 % SOLN    Sig: APPLY QD X 48 WEEKS    Dispense:  4 mL    Refill:  5    CMA served as scribe during this visit. History, Physical and Plan performed by medical provider. Documentation and  orders reviewed and attested to.  Ann Held, DO

## 2017-03-07 NOTE — Patient Instructions (Signed)

## 2017-03-15 ENCOUNTER — Other Ambulatory Visit: Payer: Self-pay | Admitting: Family Medicine

## 2017-03-15 DIAGNOSIS — I10 Essential (primary) hypertension: Secondary | ICD-10-CM

## 2017-03-25 ENCOUNTER — Ambulatory Visit: Payer: Self-pay | Admitting: Podiatry

## 2017-04-08 ENCOUNTER — Ambulatory Visit (INDEPENDENT_AMBULATORY_CARE_PROVIDER_SITE_OTHER): Payer: 59 | Admitting: Podiatry

## 2017-04-08 ENCOUNTER — Encounter: Payer: Self-pay | Admitting: Podiatry

## 2017-04-08 DIAGNOSIS — B351 Tinea unguium: Secondary | ICD-10-CM | POA: Diagnosis not present

## 2017-04-08 NOTE — Patient Instructions (Signed)

## 2017-04-08 NOTE — Progress Notes (Signed)
   Subjective:    Patient ID: Shane Haney, male    DOB: 11-02-1960, 56 y.o.   MRN: [id number]  HPI 56 year old male presents the office today for concerns of nail fungus the right big toe. He recently saw his primary care physician was started on Jublia. He states the nail started become painful with wearing shoes and pressure. Denies any redness or swelling or any drainage the toenail sites. 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 He's had no other recent treatment. He has no other concerns today.   Review of Systems  All other systems reviewed and are negative.      Objective:   Physical Exam General: AAO x3, NAD  Dermatological: Right hallux toenail is dystrophic, discolored yellow-brown discoloration there is dystrophy of the nail. Mild incurvation on the medial nail border. There is no surrounding edema, erythema, drainage or pus or any clinical signs of infection identified today. There is mild tenderness toenail.   Vascular: DP/PT pulses 2/4, CRT less than 3 seconds. There is no pain with calf compression, swelling, warmth, erythema.   Neruologic: Grossly intact via light touch bilateral. . Protective threshold with Semmes Wienstein monofilament intact to all pedal sites bilateral.   Musculoskeletal:  Mild tenderness to the right hallux toenail. No other area of tenderness then applied. No gross boney pedal deformities bilateral. No pain, crepitus, or limitation noted with foot and ankle range of motion bilateral. Muscular strength 5/5 in all groups tested bilateral.  Gait: Unassisted, Nonantalgic.      Assessment & Plan:   56 year old male right hallux onychomycosis  -Treatment options discussed including all alternatives, risks, and complications -Etiology of symptoms were discussed -Discussed treatment options for nail fungus. He is already on [place] and he wishes to continue with topical treatment and hold off on Lamisil.  However the nail is painful and discussed options for this. He'll at the have the nail removed temporarily however he'll do this with the comes back from vacation in August. Monitor for any signs or symptoms of infection.   Celesta Gentile, DPM

## 2017-05-10 ENCOUNTER — Other Ambulatory Visit: Payer: Self-pay | Admitting: Family Medicine

## 2017-05-10 DIAGNOSIS — I10 Essential (primary) hypertension: Secondary | ICD-10-CM

## 2017-05-27 ENCOUNTER — Ambulatory Visit: Payer: 59 | Admitting: Podiatry

## 2017-06-06 ENCOUNTER — Encounter: Payer: 59 | Admitting: Family Medicine

## 2017-06-07 ENCOUNTER — Other Ambulatory Visit: Payer: Self-pay | Admitting: Family Medicine

## 2017-06-07 DIAGNOSIS — I10 Essential (primary) hypertension: Secondary | ICD-10-CM

## 2017-08-19 ENCOUNTER — Ambulatory Visit (INDEPENDENT_AMBULATORY_CARE_PROVIDER_SITE_OTHER): Payer: 59 | Admitting: Family Medicine

## 2017-08-19 ENCOUNTER — Encounter: Payer: Self-pay | Admitting: Family Medicine

## 2017-08-19 VITALS — BP 142/70 | HR 70 | Temp 98.4°F | Resp 16 | Wt 299.2 lb

## 2017-08-19 DIAGNOSIS — I10 Essential (primary) hypertension: Secondary | ICD-10-CM | POA: Diagnosis not present

## 2017-08-19 DIAGNOSIS — Z23 Encounter for immunization: Secondary | ICD-10-CM | POA: Diagnosis not present

## 2017-08-19 DIAGNOSIS — H1031 Unspecified acute conjunctivitis, right eye: Secondary | ICD-10-CM

## 2017-08-19 DIAGNOSIS — J019 Acute sinusitis, unspecified: Secondary | ICD-10-CM

## 2017-08-19 DIAGNOSIS — K529 Noninfective gastroenteritis and colitis, unspecified: Secondary | ICD-10-CM | POA: Diagnosis not present

## 2017-08-19 DIAGNOSIS — B9689 Other specified bacterial agents as the cause of diseases classified elsewhere: Secondary | ICD-10-CM

## 2017-08-19 MED ORDER — DOXYCYCLINE HYCLATE 100 MG PO TABS: 100.0000 mg | ORAL_TABLET | Freq: Two times a day (BID) | ORAL | 0 refills | Status: DC

## 2017-08-19 MED ORDER — POLYMYXIN B-TRIMETHOPRIM 10000-0.1 UNIT/ML-% OP SOLN: 1.0000 [drp] | Freq: Three times a day (TID) | OPHTHALMIC | 0 refills | Status: DC

## 2017-08-19 NOTE — Patient Instructions (Addendum)
Encouraged increased rest and hydration, add probiotics, zinc such as Coldeze or Xicam. Treat fevers as needed. Elderberry liquid, vitamin c 500 to 1000mg , Mucinex twice daily Zyrtec 10 mg daily and Flonase Food Choices to Help Relieve Diarrhea, Adult When you have diarrhea, the foods you eat and your eating habits are very important. Choosing the right foods and drinks can help:  Relieve diarrhea.  Replace lost fluids and nutrients.  Prevent dehydration.  What general guidelines should I follow? Relieving diarrhea  Choose foods with less than 2 g or .07 oz. of fiber per serving.  Limit fats to less than 8 tsp (38 g or 1.34 oz.) a day.  Avoid the following: ? Foods and beverages sweetened with high-fructose corn syrup, honey, or sugar alcohols such as xylitol, sorbitol, and mannitol. ? Foods that contain a lot of fat or sugar. ? Fried, greasy, or spicy foods. ? High-fiber grains, breads, and cereals. ? Raw fruits and vegetables.  Eat foods that are rich in probiotics. These foods include dairy products such as yogurt and fermented milk products. They help increase healthy bacteria in the stomach and intestines (gastrointestinal tract, or GI tract).  If you have lactose intolerance, avoid dairy products. These may make your diarrhea worse.  Take medicine to help stop diarrhea (antidiarrheal medicine) only as told by your health care provider. Replacing nutrients  Eat small meals or snacks every 3-4 hours.  Eat bland foods, such as white rice, toast, or baked potato, until your diarrhea starts to get better. Gradually reintroduce nutrient-rich foods as tolerated or as told by your health care provider. This includes: ? Well-cooked protein foods. ? Peeled, seeded, and soft-cooked fruits and vegetables. ? Low-fat dairy products.  Take vitamin and mineral supplements as told by your health care provider. Preventing dehydration   Start by sipping water or a special solution to  prevent dehydration (oral rehydration solution, ORS). Urine that is clear or pale yellow means that you are getting enough fluid.  Try to drink at least 8-10 cups of fluid each day to help replace lost fluids.  You may add other liquids in addition to water, such as clear juice or decaffeinated sports drinks, as tolerated or as told by your health care provider.  Avoid drinks with caffeine, such as coffee, tea, or soft drinks.  Avoid alcohol. What foods are recommended? The items listed may not be a complete list. Talk with your health care provider about what dietary choices are best for you. Grains White rice. White, [place], or pita breads (fresh or toasted), including plain rolls, buns, or bagels. White pasta. Saltine, soda, or graham crackers. Pretzels. Low-fiber cereal. Cooked cereals made with water (such as cornmeal, farina, or cream cereals). Plain muffins. Matzo. Melba toast. Zwieback. Vegetables Potatoes (without the skin). Most well-cooked and canned vegetables without skins or seeds. Tender lettuce. Fruits Apple sauce. Fruits canned in juice. Cooked apricots, cherries, grapefruit, peaches, pears, or plums. Fresh bananas and cantaloupe. Meats and other protein foods Baked or boiled chicken. Eggs. Tofu. Fish. Seafood. Smooth nut butters. Ground or well-cooked tender beef, ham, veal, lamb, pork, or poultry. Dairy Plain yogurt, kefir, and unsweetened liquid yogurt. Lactose-free milk, buttermilk, skim milk, or soy milk. Low-fat or nonfat hard cheese. Beverages Water. Low-calorie sports drinks. Fruit juices without pulp. Strained tomato and vegetable juices. Decaffeinated teas. Sugar-free beverages not sweetened with sugar alcohols. Oral rehydration solutions, if approved by your health care provider. Seasoning and other foods Bouillon, broth, or soups made from recommended foods.  What foods are not recommended? The items listed may not be a complete list. Talk with your health care  provider about what dietary choices are best for you. Grains Whole grain, whole wheat, bran, or rye breads, rolls, pastas, and crackers. Wild or brown rice. Whole grain or bran cereals. Barley. Oats and oatmeal. Corn tortillas or taco shells. Granola. Popcorn. Vegetables Raw vegetables. Fried vegetables. Cabbage, broccoli, Brussels sprouts, artichokes, baked beans, beet greens, corn, kale, legumes, peas, sweet potatoes, and yams. Potato skins. Cooked spinach and cabbage. Fruits Dried fruit, including raisins and dates. Raw fruits. Stewed or dried prunes. Canned fruits with syrup. Meat and other protein foods Fried or fatty meats. Deli meats. Chunky nut butters. Nuts and seeds. Beans and lentils. Berniece Salines. Hot dogs. Sausage. Dairy High-fat cheeses. Whole milk, chocolate milk, and beverages made with milk, such as milk shakes. Half-and-half. Cream. sour cream. Ice cream. Beverages Caffeinated beverages (such as coffee, tea, soda, or energy drinks). Alcoholic beverages. Fruit juices with pulp. Prune juice. Soft drinks sweetened with high-fructose corn syrup or sugar alcohols. High-calorie sports drinks. Fats and oils Butter. Cream sauces. Margarine. Salad oils. Plain salad dressings. Olives. Avocados. Mayonnaise. Sweets and desserts Sweet rolls, doughnuts, and sweet breads. Sugar-free desserts sweetened with sugar alcohols such as xylitol and sorbitol. Seasoning and other foods Honey. Hot sauce. Chili powder. Gravy. Cream-based or milk-based soups. Pancakes and waffles. Summary  When you have diarrhea, the foods you eat and your eating habits are very important.  Make sure you get at least 8-10 cups of fluid each day, or enough to keep your urine clear or pale yellow.  Eat bland foods and gradually reintroduce healthy, nutrient-rich foods as tolerated, or as told by your health care provider.  Avoid high-fiber, fried, greasy, or spicy foods. This information is not intended to replace advice  given to you by your health care provider. Make sure you discuss any questions you have with your health care provider. Document Released: 12/21/2003 Document Revised: 09/27/2016 Document Reviewed: 09/27/2016 Elsevier Interactive Patient Education  2017 [employer].

## 2017-08-19 NOTE — Progress Notes (Signed)
Subjective:  I acted as a Education administrator for [employer]. Shane Haney, Shane Haney   Patient ID: Shane Haney, male    DOB: [birth year], 55 y.o.   MRN: [id number]  No chief complaint on file.   HPI  Patient is in today for acute visit. Pt complains of stomach virus, possible sinus infection, and right swollen eye. No recent febrile illness or acute hospitalizations. Denies CP/palp/SOB or GU c/o. Taking meds as prescribed. He is in with multiple concerns. Last week he developed abdominal cramps on Friday followed by the onset of diarrhea 6 or more times daily on Sunday. Endorses nausea and anorexia but no vomitining. He notes some intermittent low grade fevers and chills as well as myalgias and fatigue. He also notes some itching in his eyes the past few days with swelling and redness and discharge from right eye. No photophobia or eye pain.    Patient Care Team: Carollee Herter, Alferd Apa, DO as PCP - General   Past Medical History:  Diagnosis Date  . Dysrhythmia    "irregular at one time" (07/21/2013)  . Gastroenteritis 08/20/2017  . H/O echocardiogram 2010   seen by Dr. Stanford Breed- told to return as needed   . Hyperlipidemia   . Hypertension    Dr, Stanford Breed told pt. in 2010, ? extra beats.  . Neuromuscular disorder ([place])    spinal cord compression, carpal tunnel - R hand  . Left 3 fingers remains with some tingling.  . Osteoarthritis    "knees and wrists" (07/21/2013)  . Snores     Past Surgical History:  Procedure Laterality Date  . ANTERIOR CRUCIATE LIGAMENT REPAIR Left 1999  . CARPAL TUNNEL RELEASE Left 05/2013  . KNEE ARTHROSCOPY Right 2007  . OPEN REDUCTION INTERNAL FIXATION (ORIF) FOOT LISFRANC FRACTURE Right 1993   R- /w ORIF  . TONSILLECTOMY      Family History  Problem Relation Age of Onset  . Stroke Father   . Hypertension Father   . Alcohol abuse Father   . Hypertension Mother   . Aneurysm Mother        Brain  . HIV Brother        passed away from AIDS  . Colon cancer Neg Hx     . Colon polyps Neg Hx     Social History   Socioeconomic History  . Marital status: Married    Spouse name: Not on file  . Number of children: Not on file  . Years of education: Not on file  . Highest education level: Not on file  Social Needs  . Financial resource strain: Not on file  . Food insecurity - worry: Not on file  . Food insecurity - inability: Not on file  . Transportation needs - medical: Not on file  . Transportation needs - non-medical: Not on file  Occupational History  . Occupation: Pharmacist, hospital--- [employer]    Employer: [employer]  Tobacco Use  . Smoking status: Never Smoker  . Smokeless tobacco: Never Used  Substance and Sexual Activity  . Alcohol use: Yes    Alcohol/week: 2.4 oz    Types: 2 Cans of beer, 2 Shots of liquor per week    Comment: 04-05-14 "3-5 mixed drinks or 6 beers on the weekends  . Drug use: No  . Sexual activity: Yes    Partners: Female  Other Topics Concern  . Not on file  Social History Narrative   Exercise-- 5-6 days a week    Outpatient Medications Prior  to Visit  Medication Sig Dispense Refill  . Efinaconazole (JUBLIA) 10 % SOLN APPLY QD X 48 WEEKS 4 mL 5  . losartan (COZAAR) 50 MG tablet TAKE 1 TABLET(50 MG) BY MOUTH DAILY 30 tablet 0  . losartan (COZAAR) 50 MG tablet Take 1 tablet (50 mg total) by mouth daily. 90 tablet 0  . Multiple Vitamin (MULTIVITAMIN WITH MINERALS) TABS tablet Take 1 tablet by mouth daily.    . simvastatin (ZOCOR) 20 MG tablet Take 1 tablet (20 mg total) by mouth at bedtime. 30 tablet 2   No facility-administered medications prior to visit.     No Known Allergies  Review of Systems  Constitutional: Negative for fever and malaise/fatigue.  HENT: Positive for congestion.   Eyes: Positive for discharge and redness.  Respiratory: Positive for cough and shortness of breath.   Cardiovascular: Negative for chest pain and palpitations.  Gastrointestinal: Positive for abdominal pain, diarrhea and  nausea. Negative for blood in stool, constipation, melena and vomiting.  Musculoskeletal: Negative for back pain.  Skin: Negative for rash.  Neurological: Positive for headaches. Negative for loss of consciousness.       Objective:    Physical Exam  Constitutional: He is oriented to person, place, and time. He appears well-developed and well-nourished. No distress.  HENT:  Head: Normocephalic and atraumatic.  Eyes: Conjunctivae are normal.  Neck: Normal range of motion. No thyromegaly present.  Cardiovascular: Normal rate and regular rhythm.  Pulmonary/Chest: Effort normal and breath sounds normal. He has no wheezes.  Abdominal: Soft. Bowel sounds are normal. There is no tenderness.  Musculoskeletal: Normal range of motion. He exhibits no edema or deformity.  Neurological: He is alert and oriented to person, place, and time.  Skin: Skin is warm and dry. He is not diaphoretic.  Psychiatric: He has a normal mood and affect.    BP (!) 142/70 (BP Location: Right Arm, Patient Position: Sitting, Cuff Size: Large)   Pulse 70   Temp 98.4 F (36.9 C) (Oral)   Resp 16   Wt 299 lb 3.2 oz (135.7 kg)   SpO2 96%   BMI 42.93 kg/m  Wt Readings from Last 3 Encounters:  08/19/17 299 lb 3.2 oz (135.7 kg)  03/07/17 300 lb (136.1 kg)  02/03/17 (!) 302 lb 12.8 oz (137.3 kg)   BP Readings from Last 3 Encounters:  08/19/17 (!) 142/70  03/07/17 130/88  02/03/17 140/80     Immunization History  Administered Date(s) Administered  . Influenza Whole 10/12/2010  . Influenza,inj,Quad PF,6+ Mos 12/05/2014, 08/20/2016, 08/19/2017  . Influenza-Unspecified 07/09/2013  . Td 10/12/2010    Health Maintenance  Topic Date Due  . HIV Screening  03/18/1978  . COLONOSCOPY  05/29/2020  . TETANUS/TDAP  10/12/2020  . INFLUENZA VACCINE  Completed  . Hepatitis C Screening  Completed    Lab Results  Component Value Date   WBC 8.6 04/29/2016   HGB 16.3 04/29/2016   HCT 47.0 04/29/2016   PLT 273.0  04/29/2016   GLUCOSE 96 04/29/2016   CHOL 219 (H) 04/29/2016   TRIG 111.0 04/29/2016   HDL 44.80 04/29/2016   LDLDIRECT 159.5 12/09/2011   LDLCALC 152 (H) 04/29/2016   ALT 21 04/29/2016   AST 22 04/29/2016   NA 138 04/29/2016   K 4.8 04/29/2016   CL 103 04/29/2016   CREATININE 1.14 04/29/2016   BUN 11 04/29/2016   CO2 30 04/29/2016   TSH 1.61 04/29/2016   PSA 0.72 04/29/2016   INR 0.99 04/05/2014  HGBA1C 5.5 06/04/2013    Lab Results  Component Value Date   TSH 1.61 04/29/2016   Lab Results  Component Value Date   WBC 8.6 04/29/2016   HGB 16.3 04/29/2016   HCT 47.0 04/29/2016   MCV 93.8 04/29/2016   PLT 273.0 04/29/2016   Lab Results  Component Value Date   NA 138 04/29/2016   K 4.8 04/29/2016   CO2 30 04/29/2016   GLUCOSE 96 04/29/2016   BUN 11 04/29/2016   CREATININE 1.14 04/29/2016   BILITOT 1.0 04/29/2016   ALKPHOS 57 04/29/2016   AST 22 04/29/2016   ALT 21 04/29/2016   PROT 6.3 04/29/2016   ALBUMIN 4.4 04/29/2016   CALCIUM 9.7 04/29/2016   GFR 71.39 04/29/2016   Lab Results  Component Value Date   CHOL 219 (H) 04/29/2016   Lab Results  Component Value Date   HDL 44.80 04/29/2016   Lab Results  Component Value Date   LDLCALC 152 (H) 04/29/2016   Lab Results  Component Value Date   TRIG 111.0 04/29/2016   Lab Results  Component Value Date   CHOLHDL 5 04/29/2016   Lab Results  Component Value Date   HGBA1C 5.5 06/04/2013         Assessment & Plan:   Problem List Items Addressed This Visit    HYPERTENSION, BENIGN ESSENTIAL    Well controlled, no changes to meds. Encouraged heart healthy diet such as the DASH diet and exercise as tolerated.       Acute bacterial sinusitis    Unclear bacterial vs viral. Encouraged increased rest and hydration, add probiotics, zinc such as Coldeze or Xicam. Treat fevers as needed, elderberry and vitamin C and given Doxycyline if symptoms do not resolve or worsen.       Relevant Medications    doxycycline (VIBRA-TABS) 100 MG tablet   Conjunctivitis    Encouraged hot compresses tid and polytrim eye drops report wosening symptoms      Gastroenteritis    Encouraged 24 hours of clear fluids followed for BRAT diet and then progress diet as tolerated.        Other Visit Diagnoses    Needs flu shot    -  Primary   Relevant Orders   Flu Vaccine QUAD 6+ mos PF IM (Fluarix Quad PF) (Completed)      I am having Shane Haney start on trimethoprim-polymyxin b and doxycycline. I am also having him maintain his multivitamin with minerals, simvastatin, Efinaconazole, losartan, and losartan.  Meds ordered this encounter  Medications  . trimethoprim-polymyxin b (POLYTRIM) ophthalmic solution    Sig: Place 1 drop 3 (three) times daily into both eyes.    Dispense:  10 mL    Refill:  0  . doxycycline (VIBRA-TABS) 100 MG tablet    Sig: Take 1 tablet (100 mg total) 2 (two) times daily by mouth.    Dispense:  20 tablet    Refill:  0    CMA served as scribe during this visit. History, Physical and Plan performed by medical provider. Documentation and orders reviewed and attested to.  Penni Homans, MD

## 2017-08-20 ENCOUNTER — Encounter: Payer: Self-pay | Admitting: Family Medicine

## 2017-08-20 DIAGNOSIS — H109 Unspecified conjunctivitis: Secondary | ICD-10-CM | POA: Insufficient documentation

## 2017-08-20 DIAGNOSIS — K529 Noninfective gastroenteritis and colitis, unspecified: Secondary | ICD-10-CM

## 2017-08-20 HISTORY — DX: Noninfective gastroenteritis and colitis, unspecified: K52.9

## 2017-08-20 NOTE — Assessment & Plan Note (Signed)
Well controlled, no changes to meds. Encouraged heart healthy diet such as the DASH diet and exercise as tolerated.  °

## 2017-08-20 NOTE — Assessment & Plan Note (Signed)
Unclear bacterial vs viral. Encouraged increased rest and hydration, add probiotics, zinc such as Coldeze or Xicam. Treat fevers as needed, elderberry and vitamin C and given Doxycyline if symptoms do not resolve or worsen.

## 2017-08-20 NOTE — Assessment & Plan Note (Signed)
Encouraged hot compresses tid and polytrim eye drops report wosening symptoms

## 2017-08-20 NOTE — Assessment & Plan Note (Signed)
Encouraged 24 hours of clear fluids followed for BRAT diet and then progress diet as tolerated.

## 2017-09-11 ENCOUNTER — Encounter: Payer: 59 | Admitting: Family Medicine

## 2017-09-12 ENCOUNTER — Encounter: Payer: Self-pay | Admitting: Family Medicine

## 2017-09-21 ENCOUNTER — Other Ambulatory Visit: Payer: Self-pay | Admitting: Family Medicine

## 2017-09-21 DIAGNOSIS — I10 Essential (primary) hypertension: Secondary | ICD-10-CM

## 2017-11-26 ENCOUNTER — Telehealth: Payer: Self-pay | Admitting: Family Medicine

## 2017-11-26 NOTE — Telephone Encounter (Signed)
Left voicemail message that appt on 12/15/17 needs to be rescheduled.

## 2017-12-15 ENCOUNTER — Encounter: Payer: 59 | Admitting: Family Medicine

## 2017-12-18 ENCOUNTER — Ambulatory Visit (INDEPENDENT_AMBULATORY_CARE_PROVIDER_SITE_OTHER): Payer: 59 | Admitting: Medical

## 2017-12-18 ENCOUNTER — Ambulatory Visit: Payer: Self-pay | Admitting: *Deleted

## 2017-12-18 ENCOUNTER — Encounter: Payer: Self-pay | Admitting: Medical

## 2017-12-18 VITALS — BP 155/90 | HR 57 | Resp 18 | Ht 70.0 in | Wt 308.8 lb

## 2017-12-18 DIAGNOSIS — I10 Essential (primary) hypertension: Secondary | ICD-10-CM | POA: Diagnosis not present

## 2017-12-18 DIAGNOSIS — E785 Hyperlipidemia, unspecified: Secondary | ICD-10-CM | POA: Diagnosis not present

## 2017-12-18 MED ORDER — LOSARTAN POTASSIUM 100 MG PO TABS: 100.0000 mg | ORAL_TABLET | Freq: Every day | ORAL | 3 refills | Status: DC

## 2017-12-18 MED ORDER — FLUTICASONE PROPIONATE 50 MCG/ACT NA SUSP: 2.0000 | Freq: Every day | NASAL | 1 refills | Status: DC

## 2017-12-18 NOTE — Patient Instructions (Addendum)
For your history of moderate high blood pressure recently, I did prescribe you higher dose of losartan 100 mg to take daily.  Today when you get home I would like for you to take additional 50 mg tablet.  Then start the 100 mg tablets tomorrow morning.  Recommend that try to lose some weight gradually as the weather warms up.  If you have worse headache with any cardiac or neurologic type signs/symptoms then be seen at the emergency department.  You have some recent sinus pressure and that may be with related to allergic rhinitis.  I think will be a good idea to go ahead and start Flonase nasal spray before the allergy season gets started  If blood pressure becomes well controlled and allergies are controlled but you still have headache then might consider CT imaging of head.  Recommend getting over-the-counter wrist blood pressure cuff and check blood pressure every other day.  Goal blood pressure would be 130/80 to 140/90.   Follow-up in 2-3 weeks or as needed.  Please get metabolic panel and lipid panel today.

## 2017-12-18 NOTE — Telephone Encounter (Signed)
Pt called with c/o HTN. 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 The nurse there states cuff may be a little small for his arm. Pt denies any other cardiac symptoms: n/v, weakness, shortness of breath, chest pain or blurred vision. He stated that he woke up with a headache this morning and now it is just a dull headache.  He states his b/p is normally around 140/90-80 with taking his b/p meds.  Appointment made per protocol.  Pt given care advice and to call back for any symptoms he may before checking in with the doctor. Pt voiced understanding.  Reason for Disposition . [1] Systolic BP  >= 638 OR Diastolic >= 466  AND [5] having NO cardiac or neurologic symptoms  Answer Assessment - Initial Assessment Questions 1. 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 ONSET: "When did you take your blood pressure?"     This morning  3. HOW: "How did you obtain the blood pressure?" (e.g., visiting nurse, automatic home BP monitor)     Old fashion monitor 4. HISTORY: "Do you have a history of high blood pressure?"     yes 5. MEDICATIONS: "Are you taking any medications for blood pressure?" "Have you missed any doses recently?"     Is on b/p meds and have not missed any doses 6. OTHER SYMPTOMS: "Do you have any symptoms?" (e.g., headache, chest pain, blurred vision, difficulty breathing, weakness)     Woke up with headache (is a dull ache right now) 7. PREGNANCY: "Is there any chance you are pregnant?" "When was your last menstrual period?"     no  Protocols used: HIGH BLOOD PRESSURE-A-AH

## 2017-12-18 NOTE — Telephone Encounter (Signed)
Appt w/ Percell Miller later today.

## 2017-12-18 NOTE — Progress Notes (Signed)
Subjective:    Patient ID: Shane Haney, male    DOB: 1961/08/21, 57 y.o.   MRN: [id number]  HPI  Pt in states faint dull ha today. He states this is low level ha now. He has had worse ha before in his life.  This current headache is very mild/low level.  He states HA one every couple of weeks recently.   But he states actually at times has frontal sinus ha related to allergic rhinitis or sinus infection.  But he clarifies that he has no recent colored mucus drainage from his nose.  He describes more just nasal congestion.  Currently ha is bi-temporal. He 1st noticed it this morning. No light sensitivity. No nausea and no vomiting. No gross motor or sensory function deficits.   Pt went Wednesday to give plasma and his bp was 190/101. Then he went back to try to donate blood and bp was 190/101. 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 At plasma center they checked with new machine. At work today was manual check but with smaller cuff.  This winter he states seemed to gain about 25 pounds.    Review of Systems  Constitutional: Negative for chills, fatigue and fever.  HENT: Positive for congestion and sinus pressure. Negative for ear pain, mouth sores, postnasal drip, sneezing and tinnitus.   Respiratory: Negative for choking, chest tightness and wheezing.   Cardiovascular: Negative for chest pain and palpitations.  Gastrointestinal: Negative for abdominal pain.  Genitourinary: Negative for dysuria, flank pain, frequency and penile pain.  Musculoskeletal: Negative for back pain.  Skin: Negative for rash.  Neurological: Negative for dizziness, speech difficulty, weakness, numbness and headaches.       Very faint low level headache.  No associated neurologic signs or symptoms.  Hematological: Negative for adenopathy. Does not bruise/bleed easily.  Psychiatric/Behavioral: Negative for behavioral problems, confusion, dysphoric mood, hallucinations, sleep disturbance and  suicidal ideas.    Past Medical History:  Diagnosis Date  . Dysrhythmia    "irregular at one time" (07/21/2013)  . Gastroenteritis 08/20/2017  . H/O echocardiogram 2010   seen by Dr. Stanford Breed- told to return as needed   . Hyperlipidemia   . Hypertension    Dr, Stanford Breed told pt. in 2010, ? extra beats.  . Neuromuscular disorder ([place])    spinal cord compression, carpal tunnel - R hand  . Left 3 fingers remains with some tingling.  . Osteoarthritis    "knees and wrists" (07/21/2013)  . Snores      Social History   Socioeconomic History  . Marital status: Married    Spouse name: Not on file  . Number of children: Not on file  . Years of education: Not on file  . Highest education level: Not on file  Social Needs  . Financial resource strain: Not on file  . Food insecurity - worry: Not on file  . Food insecurity - inability: Not on file  . Transportation needs - medical: Not on file  . Transportation needs - non-medical: Not on file  Occupational History  . Occupation: Pharmacist, hospital--- [employer]    Employer: [employer]  Tobacco Use  . Smoking status: Never Smoker  . Smokeless tobacco: Never Used  Substance and Sexual Activity  . Alcohol use: Yes    Alcohol/week: 2.4 oz    Types: 2 Cans of beer, 2 Shots of liquor per week    Comment: 04-05-14 "3-5 mixed drinks or 6 beers on the weekends  .  Drug use: No  . Sexual activity: Yes    Partners: Female  Other Topics Concern  . Not on file  Social History Narrative   Exercise-- 5-6 days a week    Past Surgical History:  Procedure Laterality Date  . ANTERIOR CERVICAL DECOMP/DISCECTOMY FUSION N/A 07/08/2013   Procedure: ANTERIOR CERVICAL DECOMPRESSION/DISCECTOMY FUSION 2 LEVELS C3-C5;  Surgeon: Melina Schools, MD;  Location: [facility];  Service: Orthopedics;  Laterality: N/A;  . ANTERIOR CRUCIATE LIGAMENT REPAIR Left 1999  . CARPAL TUNNEL RELEASE Left 05/2013  . CARPAL TUNNEL RELEASE Right 07/08/2013   Procedure: LIMITED OPEN RIGHT  CARPAL TUNNEL RELEASE;  Surgeon: Roseanne Kaufman, MD;  Location: [facility];  Service: Orthopedics;  Laterality: Right;  . INCISION AND DRAINAGE OF WOUND N/A 07/21/2013   Procedure: IRRIGATION AND DEBRIDEMENT OF CERVICAL WOUND;  Surgeon: Melina Schools, MD;  Location: [facility];  Service: Orthopedics;  Laterality: N/A;  . KNEE ARTHROSCOPY Right 2007  . OPEN REDUCTION INTERNAL FIXATION (ORIF) FOOT LISFRANC FRACTURE Right 1993   R- /w ORIF  . TONSILLECTOMY    . TOTAL KNEE ARTHROPLASTY Left 04/11/2014   Procedure: LEFT TOTAL KNEE ARTHROPLASTY WITH HARDWARE REMOVAL;  Surgeon: [name], MD;  Location: [facility];  Service: Orthopedics;  Laterality: Left;    Family History  Problem Relation Age of Onset  . Stroke Father   . Hypertension Father   . Alcohol abuse Father   . Hypertension Mother   . Aneurysm Mother        Brain  . HIV Brother        passed away from AIDS  . Colon cancer Neg Hx   . Colon polyps Neg Hx     No Known Allergies  Current Outpatient Medications on File Prior to Visit  Medication Sig Dispense Refill  . Efinaconazole (JUBLIA) 10 % SOLN APPLY QD X 48 WEEKS 4 mL 5  . losartan (COZAAR) 50 MG tablet TAKE 1 TABLET(50 MG) BY MOUTH DAILY 30 tablet 0  . losartan (COZAAR) 50 MG tablet TAKE 1 TABLET(50 MG) BY MOUTH DAILY 90 tablet 0  . Multiple Vitamin (MULTIVITAMIN WITH MINERALS) TABS tablet Take 1 tablet by mouth daily.    Marland Kitchen trimethoprim-polymyxin b (POLYTRIM) ophthalmic solution Place 1 drop 3 (three) times daily into both eyes. 10 mL 0  . simvastatin (ZOCOR) 20 MG tablet Take 1 tablet (20 mg total) by mouth at bedtime. (Patient not taking: Reported on 12/18/2017) 30 tablet 2   No current facility-administered medications on file prior to visit.     BP (!) 147/89 (BP Location: Right Arm, Patient Position: Sitting, Cuff Size: Large)   Pulse (!) 57   Resp 18   Ht 5\' 10"  (1.778 m)   Wt (!) 308 lb 12.8 oz (140.1 kg)   SpO2 95%   BMI 44.31 kg/m       Objective:   Physical  Exam  General Mental Status- Alert. General Appearance- Not in acute distress.   Skin General: Color- Normal Color. Moisture- Normal Moisture.  Neck Carotid Arteries- Normal color. Moisture- Normal Moisture. No carotid bruits. No JVD.  Chest and Lung Exam Auscultation: Breath Sounds:-Normal.  Cardiovascular Auscultation:Rythm- Regular. Murmurs & Other Heart Sounds:Auscultation of the heart reveals- No Murmurs.  Abdomen Inspection:-Inspeection Normal. Palpation/Percussion:Note:No mass. Palpation and Percussion of the abdomen reveal- Non Tender, Non Distended + BS, no rebound or guarding.   Neurologic Cranial Nerve exam:- CN III-XII intact(No nystagmus), symmetric smile. Drift Test:- No drift. Finger to Nose:- Normal/Intact Strength:- 5/5  equal and symmetric strength both upper and lower extremities.      Assessment & Plan:   For your history of moderate high blood pressure recently, I did prescribe you higher dose of losartan 100 mg to take daily.  Today when you get home I would like for you to take additional 50 mg tablet.  Then start the 100 mg tablets tomorrow morning.  Recommend that try to lose some weight gradually as the weather warms up.  If you have worse headache with any cardiac or neurologic type signs/symptoms then be seen at the emergency department.  You have some recent sinus pressure and that may be with related to allergic rhinitis.  I think will be a good idea to go ahead and start Flonase nasal spray before the allergy season gets started  If blood pressure becomes well controlled and allergies are controlled but you still have headache then might consider CT imaging of head.  Recommend getting over-the-counter wrist blood pressure cuff and check blood pressure every other day.  Goal blood pressure would be 130/80 to 140/90.   Follow-up in 2-3 weeks or as needed.  Please get metabolic panel and lipid panel today.  Mackie Pai, PA-C

## 2017-12-19 LAB — COMPREHENSIVE METABOLIC PANEL
ALBUMIN: 4.5 g/dL (ref 3.5–5.2)
ALK PHOS: 66 U/L (ref 39–117)
ALT: 21 U/L (ref 0–53)
AST: 22 U/L (ref 0–37)
BUN: 17 mg/dL (ref 6–23)
CO2: 30 mEq/L (ref 19–32)
CREATININE: 1 mg/dL (ref 0.40–1.50)
Calcium: 9.8 mg/dL (ref 8.4–10.5)
Chloride: 103 mEq/L (ref 96–112)
GFR: 82.53 mL/min (ref >60.00)
Glucose, Bld: 95 mg/dL (ref 70–99)
POTASSIUM: 4.2 meq/L (ref 3.5–5.1)
SODIUM: 140 meq/L (ref 135–145)
Total Bilirubin: 0.9 mg/dL (ref 0.2–1.2)
Total Protein: 7.1 g/dL (ref 6.0–8.3)

## 2017-12-19 LAB — LIPID PANEL
CHOLESTEROL: 224 mg/dL — AB (ref 0–200)
HDL: 39.7 mg/dL (ref >39.00)
LDL Cholesterol: 163 mg/dL — ABNORMAL HIGH (ref 0–99)
NonHDL: 184.56
Total CHOL/HDL Ratio: 6
Triglycerides: 108 mg/dL (ref 0.0–149.0)
VLDL: 21.6 mg/dL (ref 0.0–40.0)

## 2017-12-20 ENCOUNTER — Telehealth: Payer: Self-pay | Admitting: Medical

## 2017-12-20 MED ORDER — SIMVASTATIN 20 MG PO TABS: 20.0000 mg | ORAL_TABLET | Freq: Every day | ORAL | 2 refills | Status: DC

## 2017-12-20 NOTE — Telephone Encounter (Signed)
Prescription of zocor sent to pt pharmacy.

## 2017-12-22 ENCOUNTER — Telehealth: Payer: Self-pay | Admitting: *Deleted

## 2017-12-22 NOTE — Telephone Encounter (Signed)
Copied from [place]. Topic: Inquiry >> Dec 22, 2017 11:49 AM Arletha Grippe wrote: Reason for CRM: [phone]  Pt returning call, no crm  Please call

## 2017-12-23 NOTE — Telephone Encounter (Signed)
Left message on machine.  Not sure who called.  Only message was that rx was sent in.

## 2018-01-08 ENCOUNTER — Ambulatory Visit: Payer: 59

## 2018-01-14 ENCOUNTER — Ambulatory Visit: Payer: 59

## 2018-01-23 ENCOUNTER — Telehealth: Payer: Self-pay | Admitting: Family Medicine

## 2018-01-23 ENCOUNTER — Ambulatory Visit: Payer: 59

## 2018-01-23 NOTE — Telephone Encounter (Signed)
Copied from [place] [id number]. Topic: Quick Communication - See Telephone Encounter >> Jan 23, 2018  8:13 AM Rosalin Hawking wrote: CRM for notification. See Telephone encounter for: 01/23/18.   LVM to cancels pt appt for today, pt does not need appt for BP check only needs to have a 3/4 wk fu appt (stated on pt's AVS from 12-18-17 last appt- by S[place]) Pt needs a fu appt with provider.

## 2018-02-16 ENCOUNTER — Other Ambulatory Visit: Payer: Self-pay | Admitting: Medical

## 2018-02-26 ENCOUNTER — Encounter: Payer: 59 | Admitting: Family Medicine

## 2018-04-09 ENCOUNTER — Other Ambulatory Visit: Payer: Self-pay | Admitting: Medical

## 2018-04-20 ENCOUNTER — Ambulatory Visit: Payer: Self-pay | Admitting: *Deleted

## 2018-04-20 ENCOUNTER — Encounter: Payer: Self-pay | Admitting: Family Medicine

## 2018-04-20 ENCOUNTER — Ambulatory Visit: Payer: BC Managed Care – PPO | Admitting: Family Medicine

## 2018-04-20 VITALS — BP 151/61 | HR 73 | Temp 98.3°F | Ht 70.0 in | Wt 314.6 lb

## 2018-04-20 DIAGNOSIS — R030 Elevated blood-pressure reading, without diagnosis of hypertension: Secondary | ICD-10-CM | POA: Diagnosis not present

## 2018-04-20 DIAGNOSIS — I10 Essential (primary) hypertension: Secondary | ICD-10-CM | POA: Diagnosis not present

## 2018-04-20 DIAGNOSIS — L309 Dermatitis, unspecified: Secondary | ICD-10-CM

## 2018-04-20 MED ORDER — METHYLPREDNISOLONE ACETATE 80 MG/ML IJ SUSP
80.0000 mg | Freq: Once | INTRAMUSCULAR | Status: AC
  Administered 2018-04-20: 80 mg via INTRAMUSCULAR

## 2018-04-20 MED ORDER — TRIAMCINOLONE ACETONIDE 0.5 % EX CREA: 1.0000 "application " | TOPICAL_CREAM | Freq: Two times a day (BID) | CUTANEOUS | 0 refills | Status: AC

## 2018-04-20 MED ORDER — VALSARTAN 320 MG PO TABS: 320.0000 mg | ORAL_TABLET | Freq: Every day | ORAL | 3 refills | Status: DC

## 2018-04-20 NOTE — Progress Notes (Signed)
Chief Complaint  Patient presents with  . Rash    c/o rash that started last week on his abdomen and has spread to different area. Pt states the rash does itch. Found tick on his head last week. Has tried hydrocortisone cream and neosporin which has helped some but is still itching.     Shane Haney is a 57 y.o. male here for a skin complaint.  Duration: 1 week Location: abd, L forearm, L thigh Pruritic? yes Painful? No Drainage? No New soaps/lotions/topicals/detergents? No Sick contacts? No Other associated symptoms: none Therapies tried thus far: hydrocortisone, Neosporin  Hypertension Patient presents for hypertension follow up. He does not monitor home blood pressures. He is compliant with medication losartan 100 mg/d. He was changed from lisinopril to ARB due to cough, ACEi worked better for him though. Patient has these side effects of medication: none He is sometimes adhering to a healthy diet overall. Exercise: pool work   ROS:  Const: No fevers Skin: As noted in HPI  Past Medical History:  Diagnosis Date  . Dysrhythmia    "irregular at one time" (07/21/2013)  . Gastroenteritis 08/20/2017  . H/O echocardiogram 2010   seen by Dr. Stanford Breed- told to return as needed   . Hyperlipidemia   . Hypertension    Dr, Stanford Breed told pt. in 2010, ? extra beats.  . Neuromuscular disorder ([place])    spinal cord compression, carpal tunnel - R hand  . Left 3 fingers remains with some tingling.  . Osteoarthritis    "knees and wrists" (07/21/2013)  . Snores    BP (!) 151/61   Pulse 73   Temp 98.3 F (36.8 C) (Oral)   Ht 5\' 10"  (1.778 m)   Wt (!) 314 lb 9.6 oz (142.7 kg)   SpO2 96%   BMI 45.14 kg/m  Gen: awake, alert, appearing stated age Heart: RRR Lungs: No accessory muscle use Skin: +excoriation, scaling, erythematous patches in umbilical region, volar L forearm and antero-medial L thigh; no drainage or ttp Psych: Age appropriate judgment and insight  Dermatitis -  Plan: triamcinolone cream (KENALOG) 0.5 %  Elevated blood pressure reading  HYPERTENSION, BENIGN ESSENTIAL  Orders as above. Depo injection today. Start PO antihistamine. Stop TAO. Nonscented emollient, avoid chlorine and other scented products. BP elevated on recheck. Stop Losartan, start Valsartan.  F/u in 4 weeks for BP check with Dr. Etter Sjogren.  The patient voiced understanding and agreement to the plan.  [place], DO 04/20/18 11:07 AM

## 2018-04-20 NOTE — Telephone Encounter (Signed)
  Pt called with rash on his body on the left side: foot, upper thigh, arm, buttock and belly (that is now drying up). It's itching. He also has it on the right arm inner elbow area. He is not sure if it got it from walking in the grass, been swimming or just doing the yard work. He has used hydrocortisone and neosporin that has helped some. He also found a tick in his hair while he was driving and threw it out the window, "it wasn't fat". Felt like it was not attached, no sore spot or itching there. No fever or pain. Appointment scheduled per protocol today. [facility] route [facility].  Reason for Disposition . Mild widespread rash  Answer Assessment - Initial Assessment Questions 1. APPEARANCE of RASH: "Describe the rash." (e.g., spots, blisters, raised areas, skin peeling, scaly)     Small raised bumps 2. SIZE: "How big are the spots?" (e.g., tip of pen, eraser, coin; inches, centimeters)     small 3. LOCATION: "Where is the rash located?"     Left side of belly, foot, arm and upper thigh, and right arm (elbow area) 4. COLOR: "What color is the rash?" (Note: It is difficult to assess rash color in people with darker-colored skin. When this situation occurs, simply ask the caller to describe what they see.)     red 5. ONSET: "When did the rash begin?"     Going on for about a week 6. FEVER: "Do you have a fever?" If so, ask: "What is your temperature, how was it measured, and when did it start?"     no 7. ITCHING: "Does the rash itch?" If so, ask: "How bad is the itch?" (Scale 1-10; or mild, moderate, severe)     Itching, moderate 8. CAUSE: "What do you think is causing the rash?"     Not sure 9. MEDICATION FACTORS: "Have you started any new medications within the last 2 weeks?" (e.g., antibiotics)      no 10. OTHER SYMPTOMS: "Do you have any other symptoms?" (e.g., dizziness, headache, sore throat, joint pain)       no  Protocols used: RASH OR REDNESS -  [facility]

## 2018-04-20 NOTE — Addendum Note (Signed)
Addended by: Emi Holes on: 04/20/2018 11:11 AM   Modules accepted: Orders

## 2018-04-20 NOTE — Patient Instructions (Addendum)
Avoid Neosporin.  Keep your skin hydrated with a lotion without any scent.   Try your best not to itch.  Chlorine can irritate the skin.   Claritin (loratadine), Allegra (fexofenadine), Zyrtec (cetirizine); these are listed in order from weakest to strongest. Generic, and therefore cheaper, options are in the parentheses.   Let us know if you need anything.

## 2018-05-04 ENCOUNTER — Ambulatory Visit (INDEPENDENT_AMBULATORY_CARE_PROVIDER_SITE_OTHER): Payer: BC Managed Care – PPO | Admitting: Family Medicine

## 2018-05-04 ENCOUNTER — Encounter: Payer: Self-pay | Admitting: Family Medicine

## 2018-05-04 VITALS — BP 135/86 | HR 88 | Ht 70.0 in | Wt 311.6 lb

## 2018-05-04 DIAGNOSIS — E785 Hyperlipidemia, unspecified: Secondary | ICD-10-CM | POA: Diagnosis not present

## 2018-05-04 DIAGNOSIS — Z0000 Encounter for general adult medical examination without abnormal findings: Secondary | ICD-10-CM

## 2018-05-04 DIAGNOSIS — Z23 Encounter for immunization: Secondary | ICD-10-CM

## 2018-05-04 DIAGNOSIS — I10 Essential (primary) hypertension: Secondary | ICD-10-CM

## 2018-05-04 NOTE — Assessment & Plan Note (Signed)
See AVS ghm utd Check labs shingrix given today

## 2018-05-04 NOTE — Assessment & Plan Note (Signed)
Well controlled, no changes to meds. Encouraged heart healthy diet such as the DASH diet and exercise as tolerated.  °

## 2018-05-04 NOTE — Patient Instructions (Signed)

## 2018-05-04 NOTE — Progress Notes (Signed)
Check labs.  Adjust meds prnCheck labs.  Adjust meds prnCheck labs.  Adjust meds prnCheck labs.  Adjust meds prn Patient ID: Shane Haney, male    DOB: August 14, 1961  Age: 57 y.o. MRN: [id number]    Subjective:  Subjective  HPI Shane Haney presents for cpe and labs .  No complaints  Review of Systems  Constitutional: Negative for chills and fever.  HENT: Negative for congestion and hearing loss.   Eyes: Negative for discharge.  Respiratory: Negative for cough and shortness of breath.   Cardiovascular: Negative for chest pain, palpitations and leg swelling.  Gastrointestinal: Negative for abdominal pain, blood in stool, constipation, diarrhea, nausea and vomiting.  Genitourinary: Negative for dysuria, frequency, hematuria and urgency.  Musculoskeletal: Negative for back pain and myalgias.  Skin: Negative for rash.  Allergic/Immunologic: Negative for environmental allergies.  Neurological: Negative for dizziness, weakness and headaches.  Hematological: Does not bruise/bleed easily.  Psychiatric/Behavioral: Negative for suicidal ideas. The patient is not nervous/anxious.     History Past Medical History:  Diagnosis Date  . Dysrhythmia    "irregular at one time" (07/21/2013)  . Gastroenteritis 08/20/2017  . H/O echocardiogram 2010   seen by Dr. Stanford Breed- told to return as needed   . Hyperlipidemia   . Hypertension    Dr, Stanford Breed told pt. in 2010, ? extra beats.  . Neuromuscular disorder ([place])    spinal cord compression, carpal tunnel - R hand  . Left 3 fingers remains with some tingling.  . Osteoarthritis    "knees and wrists" (07/21/2013)  . Snores     He has a past surgical history that includes Anterior cruciate ligament repair (Left, 1999); Knee arthroscopy (Right, 2007); Open reduction internal fixation (orif) foot lisfranc fracture (Right, 1993); Tonsillectomy; Carpal tunnel release (Left, 05/2013); Anterior cervical decomp/discectomy fusion (N/A, 07/08/2013); Carpal  tunnel release (Right, 07/08/2013); Incision and drainage of wound (N/A, 07/21/2013); and Total knee arthroplasty (Left, 04/11/2014).   His family history includes Alcohol abuse in his father; Aneurysm in his mother; HIV in his brother; Hypertension in his father and mother; Stroke in his father.He reports that he has never smoked. He has never used smokeless tobacco. He reports that he drinks about 2.4 oz of alcohol per week. He reports that he does not use drugs.  Current Outpatient Medications on File Prior to Visit  Medication Sig Dispense Refill  . fluticasone (FLONASE) 50 MCG/ACT nasal spray SHAKE LIQUID AND USE 2 SPRAYS IN EACH NOSTRIL DAILY 16 g 0  . Multiple Vitamin (MULTIVITAMIN WITH MINERALS) TABS tablet Take 1 tablet by mouth daily.    . simvastatin (ZOCOR) 20 MG tablet Take 1 tablet (20 mg total) by mouth at bedtime. 30 tablet 2  . valsartan (DIOVAN) 320 MG tablet Take 1 tablet (320 mg total) by mouth daily. 30 tablet 3   No current facility-administered medications on file prior to visit.      Objective:  Objective  Physical Exam  Constitutional: He is oriented to person, place, and time. Vital signs are normal. He appears well-developed and well-nourished. He is sleeping. No distress.  HENT:  Head: Normocephalic and atraumatic.  Right Ear: External ear normal.  Left Ear: External ear normal.  Nose: Nose normal.  Mouth/Throat: Oropharynx is clear and moist. No oropharyngeal exudate.  Eyes: Pupils are equal, round, and reactive to light. Conjunctivae and EOM are normal. Right eye exhibits no discharge. Left eye exhibits no discharge.  Neck: Normal range of motion. Neck supple. No JVD present.  No thyromegaly present.  Cardiovascular: Normal rate, regular rhythm and intact distal pulses. Exam reveals no gallop and no friction rub.  No murmur heard. Pulmonary/Chest: Effort normal and breath sounds normal. No respiratory distress. He has no wheezes. He has no rales. He exhibits no  tenderness.  Abdominal: Soft. Bowel sounds are normal. He exhibits no distension and no mass. There is no tenderness. There is no rebound and no guarding. No hernia.  Genitourinary: Rectum normal, prostate normal and penis normal. Rectal exam shows guaiac negative stool.  Musculoskeletal: Normal range of motion. He exhibits no edema or tenderness.  Lymphadenopathy:    He has no cervical adenopathy.  Neurological: He is alert and oriented to person, place, and time. He has normal reflexes. He displays normal reflexes. He exhibits normal muscle tone.  Skin: Skin is warm and dry. No rash noted. He is not diaphoretic. No erythema. No pallor.  Psychiatric: He has a normal mood and affect. His behavior is normal. Judgment and thought content normal.  Nursing note and vitals reviewed.  BP 135/86 (BP Location: Left Arm, Patient Position: Sitting, Cuff Size: Large)   Pulse 88   Ht 5\' 10"  (1.778 m)   Wt (!) 311 lb 9.6 oz (141.3 kg)   SpO2 96%   BMI 44.71 kg/m  Wt Readings from Last 3 Encounters:  05/04/18 (!) 311 lb 9.6 oz (141.3 kg)  04/20/18 (!) 314 lb 9.6 oz (142.7 kg)  12/18/17 (!) 308 lb 12.8 oz (140.1 kg)     Lab Results  Component Value Date   WBC 8.6 04/29/2016   HGB 16.3 04/29/2016   HCT 47.0 04/29/2016   PLT 273.0 04/29/2016   GLUCOSE 95 12/18/2017   CHOL 224 (H) 12/18/2017   TRIG 108.0 12/18/2017   HDL 39.70 12/18/2017   LDLDIRECT 159.5 12/09/2011   LDLCALC 163 (H) 12/18/2017   ALT 21 12/18/2017   AST 22 12/18/2017   NA 140 12/18/2017   K 4.2 12/18/2017   CL 103 12/18/2017   CREATININE 1.00 12/18/2017   BUN 17 12/18/2017   CO2 30 12/18/2017   TSH 1.61 04/29/2016   PSA 0.72 04/29/2016   INR 0.99 04/05/2014   HGBA1C 5.5 06/04/2013    Dg Cervical Spine 2 Or 3 Views  Result Date: 02/03/2017 CLINICAL DATA:  Fall several days ago with neck pain, initial encounter EXAM: CERVICAL SPINE - 2-3 VIEW COMPARISON:  10/01/2013 FINDINGS: Fusion is noted at C3-4 and C4-5 with  anterior fixation. Osteophytic changes at C5-6 and C6-7 are noted. Facet hypertrophic changes are noted at multiple levels. No hardware failure is seen. The odontoid is within normal limits. No soft tissue abnormality is noted. IMPRESSION: Degenerative and postsurgical changes without acute abnormality. Electronically Signed   By: Inez Catalina M.D.   On: 02/03/2017 16:12     Assessment & Plan:  Plan  I am having Shane Haney maintain his multivitamin with minerals, simvastatin, fluticasone, and valsartan.  No orders of the defined types were placed in this encounter.   Problem List Items Addressed This Visit      Unprioritized   Hyperlipidemia LDL goal <100   Relevant Orders   Lipid panel   Comprehensive metabolic panel   HYPERTENSION, BENIGN ESSENTIAL    Well controlled, no changes to meds. Encouraged heart healthy diet such as the DASH diet and exercise as tolerated.       Preventative health care - Primary    See AVS ghm utd Check labs shingrix given today  Relevant Orders   PSA   TSH   Lipid panel   CBC with Differential/Platelet   Comprehensive metabolic panel    Other Visit Diagnoses    Essential hypertension       Relevant Orders   Lipid panel   CBC with Differential/Platelet   Comprehensive metabolic panel   Immunization due       Relevant Orders   Varicella-zoster vaccine IM (Shingrix) (Completed)   Morbid obesity ([place])       Relevant Orders   Amb Ref to Medical Weight Management      Follow-up: Return in about 6 months (around 11/04/2018), or if symptoms worsen or fail to improve.  Ann Held, DO

## 2018-05-05 LAB — COMPREHENSIVE METABOLIC PANEL
ALK PHOS: 76 U/L (ref 39–117)
ALT: 23 U/L (ref 0–53)
AST: 18 U/L (ref 0–37)
Albumin: 4.5 g/dL (ref 3.5–5.2)
BUN: 15 mg/dL (ref 6–23)
CHLORIDE: 103 meq/L (ref 96–112)
CO2: 30 mEq/L (ref 19–32)
Calcium: 9.8 mg/dL (ref 8.4–10.5)
Creatinine, Ser: 1.17 mg/dL (ref 0.40–1.50)
GFR: 68.76 mL/min (ref >60.00)
Glucose, Bld: 96 mg/dL (ref 70–99)
POTASSIUM: 4.2 meq/L (ref 3.5–5.1)
Sodium: 140 mEq/L (ref 135–145)
TOTAL PROTEIN: 7 g/dL (ref 6.0–8.3)
Total Bilirubin: 0.8 mg/dL (ref 0.2–1.2)

## 2018-05-05 LAB — CBC WITH DIFFERENTIAL/PLATELET
Basophils Absolute: 0.1 10*3/uL (ref 0.0–0.1)
Basophils Relative: 1.4 % (ref 0.0–3.0)
EOS PCT: 6.1 % — AB (ref 0.0–5.0)
Eosinophils Absolute: 0.5 10*3/uL (ref 0.0–0.7)
HCT: 46.4 % (ref 39.0–52.0)
Hemoglobin: 15.8 g/dL (ref 13.0–17.0)
Lymphocytes Relative: 31.5 % (ref 12.0–46.0)
Lymphs Abs: 2.4 10*3/uL (ref 0.7–4.0)
MCHC: 34 g/dL (ref 30.0–36.0)
MCV: 94.5 fl (ref 78.0–100.0)
MONO ABS: 0.7 10*3/uL (ref 0.1–1.0)
Monocytes Relative: 9.1 % (ref 3.0–12.0)
Neutro Abs: 4 10*3/uL (ref 1.4–7.7)
Neutrophils Relative %: 51.9 % (ref 43.0–77.0)
Platelets: 245 10*3/uL (ref 150.0–400.0)
RBC: 4.91 Mil/uL (ref 4.22–5.81)
RDW: 13 % (ref 11.5–15.5)
WBC: 7.7 10*3/uL (ref 4.0–10.5)

## 2018-05-05 LAB — LIPID PANEL
CHOLESTEROL: 186 mg/dL (ref 0–200)
HDL: 35.8 mg/dL — ABNORMAL LOW (ref >39.00)
LDL CALC: 126 mg/dL — AB (ref 0–99)
NonHDL: 149.94
Total CHOL/HDL Ratio: 5
Triglycerides: 122 mg/dL (ref 0.0–149.0)
VLDL: 24.4 mg/dL (ref 0.0–40.0)

## 2018-05-05 LAB — POC HEMOCCULT BLD/STL (OFFICE/1-CARD/DIAGNOSTIC): Fecal Occult Blood, POC: NEGATIVE

## 2018-05-05 LAB — TSH: TSH: 2.43 u[IU]/mL (ref 0.35–4.50)

## 2018-05-05 LAB — PSA: PSA: 1.02 ng/mL (ref 0.10–4.00)

## 2018-05-05 NOTE — Addendum Note (Signed)
Addended by: Emi Holes on: 05/05/2018 01:04 AM   Modules accepted: Orders

## 2018-05-06 ENCOUNTER — Other Ambulatory Visit: Payer: Self-pay | Admitting: Family Medicine

## 2018-05-06 DIAGNOSIS — E785 Hyperlipidemia, unspecified: Secondary | ICD-10-CM

## 2018-05-06 DIAGNOSIS — R972 Elevated prostate specific antigen [PSA]: Secondary | ICD-10-CM

## 2018-05-11 ENCOUNTER — Other Ambulatory Visit: Payer: Self-pay | Admitting: Family Medicine

## 2018-07-09 ENCOUNTER — Ambulatory Visit: Payer: BC Managed Care – PPO

## 2018-07-23 ENCOUNTER — Ambulatory Visit: Payer: BC Managed Care – PPO

## 2018-08-21 ENCOUNTER — Other Ambulatory Visit: Payer: Self-pay | Admitting: Family Medicine

## 2018-11-05 ENCOUNTER — Ambulatory Visit: Payer: BC Managed Care – PPO | Admitting: Family Medicine

## 2018-11-11 ENCOUNTER — Other Ambulatory Visit: Payer: Self-pay | Admitting: Family Medicine

## 2018-12-08 ENCOUNTER — Ambulatory Visit: Payer: BC Managed Care – PPO | Admitting: Internal Medicine

## 2018-12-08 ENCOUNTER — Encounter: Payer: Self-pay | Admitting: Internal Medicine

## 2018-12-08 VITALS — BP 128/84 | HR 74 | Temp 98.2°F | Resp 16 | Ht 70.0 in | Wt 307.5 lb

## 2018-12-08 DIAGNOSIS — J019 Acute sinusitis, unspecified: Secondary | ICD-10-CM

## 2018-12-08 MED ORDER — AZELASTINE HCL 0.1 % NA SOLN: 2.0000 | Freq: Every evening | NASAL | 3 refills | Status: DC | PRN

## 2018-12-08 MED ORDER — HYDROCODONE-HOMATROPINE 5-1.5 MG/5ML PO SYRP: 5.0000 mL | ORAL_SOLUTION | Freq: Every evening | ORAL | 0 refills | Status: DC | PRN

## 2018-12-08 MED ORDER — AMOXICILLIN-POT CLAVULANATE 875-125 MG PO TABS: 1.0000 | ORAL_TABLET | Freq: Two times a day (BID) | ORAL | 0 refills | Status: DC

## 2018-12-08 NOTE — Patient Instructions (Signed)
Rest, fluids , tylenol  For cough:  Take Mucinex DM twice a day as needed until better If the cough is sever at night, take hydrocodone, will cause drowsiness   For nasal congestion: Use OTC  Flonase : 2 nasal sprays on each side of the nose in the morning until you feel better Use ASTELIN a prescribed spray : 2 nasal sprays on each side of the nose at night until you feel better   Avoid decongestants such as  Pseudoephedrine or phenylephrine     Take the antibiotic as prescribed     Call if not gradually better over the next  10 days  Call anytime if the symptoms are severe

## 2018-12-08 NOTE — Progress Notes (Signed)
Pre visit review using our clinic review tool, if applicable. No additional management support is needed unless otherwise documented below in the visit note. 

## 2018-12-08 NOTE — Progress Notes (Signed)
Subjective:    Patient ID: Shane Haney, male    DOB: [birth year], 58 y.o.   MRN: [id number]  DOS:  12/08/2018 Type of visit - description: Acute visit Symptoms started 4 weeks ago, worse in the last 6 nights: Cough with some sputum production.  Nasal discharge, greenish in color.  Symptoms are worse at night and sometimes he has trouble sleeping. He is taking Flonase and DayQuil.   Review of Systems Denies fever chills No history of asthma or diabetes No history of aches pains or nausea vomiting.  Past Medical History:  Diagnosis Date  . Dysrhythmia    "irregular at one time" (07/21/2013)  . Gastroenteritis 08/20/2017  . H/O echocardiogram 2010   seen by Dr. Stanford Breed- told to return as needed   . Hyperlipidemia   . Hypertension    Dr, Stanford Breed told pt. in 2010, ? extra beats.  . Neuromuscular disorder ([place])    spinal cord compression, carpal tunnel - R hand  . Left 3 fingers remains with some tingling.  . Osteoarthritis    "knees and wrists" (07/21/2013)  . Snores     Past Surgical History:  Procedure Laterality Date  . ANTERIOR CERVICAL DECOMP/DISCECTOMY FUSION N/A 07/08/2013   Procedure: ANTERIOR CERVICAL DECOMPRESSION/DISCECTOMY FUSION 2 LEVELS C3-C5;  Surgeon: Melina Schools, MD;  Location: [facility];  Service: Orthopedics;  Laterality: N/A;  . ANTERIOR CRUCIATE LIGAMENT REPAIR Left 1999  . CARPAL TUNNEL RELEASE Left 05/2013  . CARPAL TUNNEL RELEASE Right 07/08/2013   Procedure: LIMITED OPEN RIGHT CARPAL TUNNEL RELEASE;  Surgeon: Roseanne Kaufman, MD;  Location: [facility];  Service: Orthopedics;  Laterality: Right;  . INCISION AND DRAINAGE OF WOUND N/A 07/21/2013   Procedure: IRRIGATION AND DEBRIDEMENT OF CERVICAL WOUND;  Surgeon: Melina Schools, MD;  Location: [facility];  Service: Orthopedics;  Laterality: N/A;  . KNEE ARTHROSCOPY Right 2007  . OPEN REDUCTION INTERNAL FIXATION (ORIF) FOOT LISFRANC FRACTURE Right 1993   R- /w ORIF  . TONSILLECTOMY    . TOTAL KNEE ARTHROPLASTY Left  04/11/2014   Procedure: LEFT TOTAL KNEE ARTHROPLASTY WITH HARDWARE REMOVAL;  Surgeon: [name], MD;  Location: [facility];  Service: Orthopedics;  Laterality: Left;    Social History   Socioeconomic History  . Marital status: Married    Spouse name: Not on file  . Number of children: Not on file  . Years of education: Not on file  . Highest education level: Not on file  Occupational History  . Occupation: Pharmacist, hospital--- [employer]    Employer: [employer]  . Financial resource strain: Not on file  . Food insecurity:    Worry: Not on file    Inability: Not on file  . Transportation needs:    Medical: Not on file    Non-medical: Not on file  Tobacco Use  . Smoking status: Never Smoker  . Smokeless tobacco: Never Used  Substance and Sexual Activity  . Alcohol use: Yes    Alcohol/week: 4.0 standard drinks    Types: 2 Cans of beer, 2 Shots of liquor per week    Comment: 04-05-14 "3-5 mixed drinks or 6 beers on the weekends  . Drug use: No  . Sexual activity: Yes    Partners: Female  Lifestyle  . Physical activity:    Days per week: Not on file    Minutes per session: Not on file  . Stress: Not on file  Relationships  . Social connections:    Talks on  phone: Not on file    Gets together: Not on file    Attends religious service: Not on file    Active member of club or organization: Not on file    Attends meetings of clubs or organizations: Not on file    Relationship status: Not on file  . Intimate partner violence:    Fear of current or ex partner: Not on file    Emotionally abused: Not on file    Physically abused: Not on file    Forced sexual activity: Not on file  Other Topics Concern  . Not on file  Social History Narrative   Exercise-- 5-6 days a week      Allergies as of 12/08/2018   No Known Allergies     Medication List       Accurate as of December 08, 2018 11:59 PM. Always use your most recent med list.          amoxicillin-clavulanate 875-125 MG tablet Commonly known as:  AUGMENTIN Take 1 tablet by mouth 2 (two) times daily.   azelastine 0.1 % nasal spray Commonly known as:  ASTELIN Place 2 sprays into both nostrils at bedtime as needed for rhinitis. Use in each nostril as directed   fluticasone 50 MCG/ACT nasal spray Commonly known as:  FLONASE SHAKE LIQUID AND USE 2 SPRAYS IN EACH NOSTRIL DAILY   HYDROcodone-homatropine 5-1.5 MG/5ML syrup Commonly known as:  HYCODAN Take 5 mLs by mouth at bedtime as needed for cough.   multivitamin with minerals Tabs tablet Take 1 tablet by mouth daily.   simvastatin 20 MG tablet Commonly known as:  ZOCOR Take 1 tablet (20 mg total) by mouth at bedtime.   valsartan 320 MG tablet Commonly known as:  DIOVAN TAKE 1 TABLET(320 MG) BY MOUTH DAILY           Objective:   Physical Exam BP 128/84 (BP Location: Right Arm, Patient Position: Sitting, Cuff Size: Normal)   Pulse 74   Temp 98.2 F (36.8 C) (Oral)   Resp 16   Ht 5\' 10"  (1.778 m)   Wt (!) 307 lb 8 oz (139.5 kg)   SpO2 96%   BMI 44.12 kg/m  General:   Well developed, NAD, BMI noted. HEENT:  Normocephalic . Face symmetric, atraumatic.  TMs normal.  Throat slightly red but no discharge. Nose congested, sinuses no TTP Lungs:  Few rhonchi with cough Normal respiratory effort, no intercostal retractions, no accessory muscle use. Heart: RRR,  no murmur.  No pretibial edema bilaterally  Skin: Not pale. Not jaundice Neurologic:  alert & oriented X3.  Speech normal, gait appropriate for age and unassisted Psych--  Cognition and judgment appear intact.  Cooperative with normal attention span and concentration.  Behavior appropriate. No anxious or depressed appearing.      Assessment    58 year old,PMH includes high cholesterol, hypertension, presents with  Sinusitis: Exam is benign but symptoms suggest sinusitis, he has been sick for 4 weeks. Plan: Augmentin, continue Flonase,  add Astelin.  Having difficult time sleeping at night, add hydrocodone, drowsiness discussed.  See AVS.

## 2019-03-01 ENCOUNTER — Encounter: Payer: BC Managed Care – PPO | Admitting: Family Medicine

## 2019-05-12 ENCOUNTER — Other Ambulatory Visit: Payer: Self-pay | Admitting: Family Medicine

## 2019-06-03 ENCOUNTER — Encounter: Payer: BC Managed Care – PPO | Admitting: Family Medicine

## 2019-06-16 ENCOUNTER — Encounter: Payer: BC Managed Care – PPO | Admitting: Family Medicine

## 2019-07-13 ENCOUNTER — Encounter: Payer: BC Managed Care – PPO | Admitting: Family Medicine

## 2019-09-15 ENCOUNTER — Encounter: Payer: BC Managed Care – PPO | Admitting: Family Medicine

## 2019-11-04 ENCOUNTER — Other Ambulatory Visit: Payer: BC Managed Care – PPO

## 2019-11-08 ENCOUNTER — Other Ambulatory Visit: Payer: BC Managed Care – PPO

## 2019-11-09 ENCOUNTER — Other Ambulatory Visit: Payer: Self-pay | Admitting: Family Medicine

## 2019-11-11 ENCOUNTER — Encounter: Payer: BC Managed Care – PPO | Admitting: Family Medicine

## 2019-12-07 ENCOUNTER — Other Ambulatory Visit: Payer: Self-pay | Admitting: *Deleted

## 2019-12-07 MED ORDER — VALSARTAN 320 MG PO TABS: ORAL_TABLET | ORAL | 0 refills | Status: DC

## 2019-12-09 ENCOUNTER — Ambulatory Visit: Payer: BC Managed Care – PPO

## 2019-12-21 ENCOUNTER — Encounter: Payer: BC Managed Care – PPO | Admitting: Family Medicine

## 2020-01-13 ENCOUNTER — Other Ambulatory Visit: Payer: Self-pay | Admitting: *Deleted

## 2020-01-13 ENCOUNTER — Telehealth: Payer: Self-pay | Admitting: Family Medicine

## 2020-01-13 MED ORDER — VALSARTAN 320 MG PO TABS: ORAL_TABLET | ORAL | 0 refills | Status: DC

## 2020-01-13 NOTE — Telephone Encounter (Signed)
Medication: valsartan (DIOVAN) 320 MG tablet   Has the patient contacted their pharmacy? Yes.   (If no, request that the patient contact the pharmacy for the refill.) (If yes, when and what did the pharmacy advise?) There pharm stated they reached out to [place]   Preferred Pharmacy (with phone number or street name): [facility] [place], [address]  [place], [place] [address]  Agent: Please be advised that RX refills may take up to 3 business days. We ask that you follow-up with your pharmacy.

## 2020-01-13 NOTE — Telephone Encounter (Signed)
Med refill already done by [facility]. Pt was contacted back and vm was left.

## 2020-02-08 ENCOUNTER — Encounter: Payer: BC Managed Care – PPO | Admitting: Family Medicine

## 2020-02-21 ENCOUNTER — Telehealth: Payer: Self-pay

## 2020-02-21 MED ORDER — VALSARTAN 320 MG PO TABS: ORAL_TABLET | ORAL | 0 refills | Status: DC

## 2020-02-21 NOTE — Telephone Encounter (Signed)
Refill sent.

## 2020-02-21 NOTE — Telephone Encounter (Signed)
Patient called in to get a prescription refill for valsartan (DIOVAN) 320 MG tablet [id number]    Please send it to [facility] [place], [address]  [place], [place] [address]  Phone:  [phone] Fax:  [phone]  DEA #:  [id number]

## 2020-03-20 ENCOUNTER — Encounter: Payer: BC Managed Care – PPO | Admitting: Family Medicine

## 2020-03-22 ENCOUNTER — Encounter: Payer: Self-pay | Admitting: Gastroenterology

## 2020-03-28 ENCOUNTER — Other Ambulatory Visit: Payer: Self-pay

## 2020-03-28 MED ORDER — VALSARTAN 320 MG PO TABS: ORAL_TABLET | ORAL | 0 refills | Status: DC

## 2020-04-14 ENCOUNTER — Encounter: Payer: BC Managed Care – PPO | Admitting: Family Medicine

## 2020-05-01 ENCOUNTER — Telehealth: Payer: Self-pay | Admitting: Family Medicine

## 2020-05-01 MED ORDER — SIMVASTATIN 20 MG PO TABS: 20.0000 mg | ORAL_TABLET | Freq: Every day | ORAL | 0 refills | Status: DC

## 2020-05-01 MED ORDER — VALSARTAN 320 MG PO TABS: ORAL_TABLET | ORAL | 0 refills | Status: DC

## 2020-05-01 NOTE — Telephone Encounter (Signed)
Medication:  simvastatin (ZOCOR) 20 MG tablet [[id number]]   valsartan (DIOVAN) 320 MG tablet [[id number]]    Has the patient contacted their pharmacy? No. (If no, request that the patient contact the pharmacy for the refill.) (If yes, when and what did the pharmacy advise?)  Preferred Pharmacy (with phone number or street name): [facility] [place], [address]  [place], [place] [address]  Phone:  [phone] Fax:  [phone]  DEA #:  [id number]  Agent: Please be advised that RX refills may take up to 3 business days. We ask that you follow-up with your pharmacy.

## 2020-05-01 NOTE — Telephone Encounter (Signed)
Refill sent. 15 days sent to pharmacy. Pt no showed last OV. Last refill.

## 2020-05-08 ENCOUNTER — Ambulatory Visit: Payer: BC Managed Care – PPO | Admitting: Family Medicine

## 2020-05-14 ENCOUNTER — Other Ambulatory Visit: Payer: Self-pay | Admitting: Family Medicine

## 2020-05-18 ENCOUNTER — Telehealth (INDEPENDENT_AMBULATORY_CARE_PROVIDER_SITE_OTHER): Payer: BC Managed Care – PPO | Admitting: Family Medicine

## 2020-05-18 ENCOUNTER — Encounter: Payer: Self-pay | Admitting: Family Medicine

## 2020-05-18 VITALS — BP 142/90 | Ht 70.0 in | Wt 300.0 lb

## 2020-05-18 DIAGNOSIS — E785 Hyperlipidemia, unspecified: Secondary | ICD-10-CM | POA: Diagnosis not present

## 2020-05-18 DIAGNOSIS — I10 Essential (primary) hypertension: Secondary | ICD-10-CM

## 2020-05-18 MED ORDER — SIMVASTATIN 20 MG PO TABS: 20.0000 mg | ORAL_TABLET | Freq: Every day | ORAL | 2 refills | Status: DC

## 2020-05-18 MED ORDER — VALSARTAN 320 MG PO TABS: ORAL_TABLET | ORAL | 2 refills | Status: DC

## 2020-05-18 NOTE — Progress Notes (Signed)
Virtual Visit via Video Note  I connected with Shane Haney on 05/18/20 at  9:40 AM EDT by a video enabled telemedicine application and verified that I am speaking with the correct person using two identifiers.  Location: Patient: in car alone  Provider: office   I discussed the limitations of evaluation and management by telemedicine and the availability of in person appointments. The patient expressed understanding and agreed to proceed.  History of Present Illness: Pt is in his car and needs f/u bp and cholesterol and c/o bump on his rectum and hardness in testicles   No other complaints--- he is out of bp meds Observations/Objective: Today's Vitals   05/18/20 0945  BP: (!) 142/90  Weight: 300 lb (136.1 kg)  Height: 5\' 10"  (1.778 m)   Body mass index is 43.05 kg/m.  Pt is in nad  No sob No cp Assessment and Plan: 1. Essential hypertension .Poorly controlled will alter medications, encouraged DASH diet, minimize caffeine and obtain adequate sleep. Report concerning symptoms and follow up as directed and as needed - valsartan (DIOVAN) 320 MG tablet; TAKE 1 TABLET(320 MG) BY MOUTH DAILY.l  Dispense: 30 tablet; Refill: 2 Pt had run out of bp med Coming in Monday 2. Dyslipidemia Tolerating statin, encouraged heart healthy diet, avoid trans fats, minimize simple carbs and saturated fats. Increase exercise as tolerated - simvastatin (ZOCOR) 20 MG tablet; Take 1 tablet (20 mg total) by mouth at bedtime.  Dispense: 30 tablet; Refill: 2 Coming in Monday for labs and f/u Follow Up Instructions:    I discussed the assessment and treatment plan with the patient. The patient was provided an opportunity to ask questions and all were answered. The patient agreed with the plan and demonstrated an understanding of the instructions.   The patient was advised to call back or seek an in-person evaluation if the symptoms worsen or if the condition fails to improve as anticipated.  I  provided 30 minutes of non-face-to-face time during this encounter.   Ann Held, DO

## 2020-05-22 ENCOUNTER — Ambulatory Visit: Payer: BC Managed Care – PPO | Admitting: Family Medicine

## 2020-05-29 ENCOUNTER — Ambulatory Visit: Payer: BC Managed Care – PPO | Admitting: Family Medicine

## 2020-07-10 ENCOUNTER — Telehealth (INDEPENDENT_AMBULATORY_CARE_PROVIDER_SITE_OTHER): Payer: BC Managed Care – PPO | Admitting: Family Medicine

## 2020-07-10 ENCOUNTER — Other Ambulatory Visit: Payer: Self-pay

## 2020-07-10 ENCOUNTER — Encounter: Payer: Self-pay | Admitting: Family Medicine

## 2020-07-10 VITALS — Temp 98.0°F | Ht 70.0 in | Wt 300.0 lb

## 2020-07-10 DIAGNOSIS — E785 Hyperlipidemia, unspecified: Secondary | ICD-10-CM | POA: Diagnosis not present

## 2020-07-10 DIAGNOSIS — J0140 Acute pansinusitis, unspecified: Secondary | ICD-10-CM | POA: Diagnosis not present

## 2020-07-10 DIAGNOSIS — I10 Essential (primary) hypertension: Secondary | ICD-10-CM | POA: Diagnosis not present

## 2020-07-10 MED ORDER — SIMVASTATIN 20 MG PO TABS: 20.0000 mg | ORAL_TABLET | Freq: Every day | ORAL | 2 refills | Status: DC

## 2020-07-10 MED ORDER — FLUTICASONE PROPIONATE 50 MCG/ACT NA SUSP: NASAL | 0 refills | Status: DC

## 2020-07-10 MED ORDER — VALSARTAN 320 MG PO TABS: ORAL_TABLET | ORAL | 2 refills | Status: DC

## 2020-07-10 MED ORDER — AMOXICILLIN-POT CLAVULANATE 875-125 MG PO TABS: 1.0000 | ORAL_TABLET | Freq: Two times a day (BID) | ORAL | 0 refills | Status: DC

## 2020-07-10 NOTE — Progress Notes (Signed)
Virtual Visit via Video Note  I connected with Shane Haney on 07/10/20 at  3:20 PM EDT by a video enabled telemedicine application and verified that I am speaking with the correct person using two identifiers.  Location/ persons in visit  Patient: home alone  Provider: office    I discussed the limitations of evaluation and management by telemedicine and the availability of in person appointments. The patient expressed understanding and agreed to proceed.  History of Present Illness: Pt is home c/o 10 days hx sinus drainage / productive cough.  No fever   + body aches and chills   Observations/Objective: Vitals:   07/10/20 1524  Temp: 98 F (36.7 C)  pt is in NAD-- no sob   Assessment and Plan: 1. Essential hypertension Well controlled, no changes to meds. Encouraged heart healthy diet such as the DASH diet and exercise as tolerated.   - valsartan (DIOVAN) 320 MG tablet; TAKE 1 TABLET(320 MG) BY MOUTH DAILY.l  Dispense: 30 tablet; Refill: 2  2. Dyslipidemia Encouraged heart healthy diet, increase exercise, avoid trans fats, consider a krill oil cap daily - simvastatin (ZOCOR) 20 MG tablet; Take 1 tablet (20 mg total) by mouth at bedtime.  Dispense: 30 tablet; Refill: 2  3. Acute non-recurrent pansinusitis con't flonase and astelin con't mucinex abx per orders  - amoxicillin-clavulanate (AUGMENTIN) 875-125 MG tablet; Take 1 tablet by mouth 2 (two) times daily.  Dispense: 20 tablet; Refill: 0 - fluticasone (FLONASE) 50 MCG/ACT nasal spray; SHAKE LIQUID AND USE 2 SPRAYS IN EACH NOSTRIL DAILY  Dispense: 16 g; Refill: 0 - Novel Coronavirus, NAA ([facility])  Follow Up Instructions:    I discussed the assessment and treatment plan with the patient. The patient was provided an opportunity to ask questions and all were answered. The patient agreed with the plan and demonstrated an understanding of the instructions.   The patient was advised to call back or seek an in-person  evaluation if the symptoms worsen or if the condition fails to improve as anticipated.  I provided 25 minutes of non-face-to-face time during this encounter.   Ann Held, DO

## 2020-07-12 LAB — SARS-COV-2, NAA 2 DAY TAT

## 2020-07-12 LAB — NOVEL CORONAVIRUS, NAA: SARS-CoV-2, NAA: NOT DETECTED

## 2020-08-06 ENCOUNTER — Other Ambulatory Visit: Payer: Self-pay | Admitting: Family Medicine

## 2020-08-06 DIAGNOSIS — J0140 Acute pansinusitis, unspecified: Secondary | ICD-10-CM

## 2020-08-13 ENCOUNTER — Other Ambulatory Visit: Payer: Self-pay | Admitting: Family Medicine

## 2020-08-13 DIAGNOSIS — I10 Essential (primary) hypertension: Secondary | ICD-10-CM

## 2020-08-13 DIAGNOSIS — E785 Hyperlipidemia, unspecified: Secondary | ICD-10-CM

## 2020-09-11 ENCOUNTER — Other Ambulatory Visit: Payer: Self-pay | Admitting: Family Medicine

## 2020-09-11 DIAGNOSIS — J0140 Acute pansinusitis, unspecified: Secondary | ICD-10-CM

## 2020-09-19 ENCOUNTER — Other Ambulatory Visit: Payer: Self-pay

## 2020-09-19 ENCOUNTER — Telehealth: Payer: Self-pay | Admitting: *Deleted

## 2020-09-19 DIAGNOSIS — Z86010 Personal history of colonic polyps: Secondary | ICD-10-CM

## 2020-09-19 NOTE — Telephone Encounter (Signed)
CAlled pt=- [facility] P[facility]

## 2020-09-19 NOTE — Telephone Encounter (Signed)
Sheri,  Can you please schedule  this at [facility] as per Dr Filomena Jungling PV - thanks !!!

## 2020-09-19 NOTE — Telephone Encounter (Signed)
Direct at [employer].

## 2020-09-19 NOTE — Telephone Encounter (Signed)
DR Fuller Plan,  This pt is scheduled for a recall Colon 1-18 Tuesday for a hx of TA polyps- In reviewing his chart at a 2014 surgery this is note in the OR record  Difficulty Due To: Difficulty was anticipated, Difficult Airway- due to reduced neck mobility and Difficult Airway- due to limited oral opening Comments: Atraumatic induction and intubation with planned video laryngoscope. Good view of cords, ETT passed easily.  Dr. Albertina Parr verified placement of ETT.  Waldron Session, CRNA  He has a colon in the [facility] 05-30-2015 with no issues  Do you want him to have an OV or direct at [facility] for his procedure?  Please advise, Thanks for your time, Marijean Niemann

## 2020-09-19 NOTE — Telephone Encounter (Signed)
First available date is 09/29/21.  He will need to arrive at 10:00 at [facility] .  He will need to do his COVID screen at 8:30 on 10/26/20 at the [facility] location.

## 2020-09-20 NOTE — Telephone Encounter (Signed)
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

## 2020-09-21 NOTE — Telephone Encounter (Signed)
I attempted pt again today with no success  Mailed pt a letter and also sent the letter through My Chart about scheduling change due to difficult intubation

## 2020-10-03 ENCOUNTER — Telehealth: Payer: Self-pay | Admitting: Gastroenterology

## 2020-10-03 NOTE — Telephone Encounter (Signed)
Inbound call from patient requesting to reschedule procedure scheduled at [facility] on 10/30/20.  States can not do procedure on that day.

## 2020-10-04 NOTE — Telephone Encounter (Signed)
Left message for patient to call back Next available date is 12/18/20

## 2020-10-05 NOTE — Telephone Encounter (Signed)
Case has been cancelled.  I left a message asking the patient to call back to reschedule and that the next available is in March.

## 2020-10-09 ENCOUNTER — Other Ambulatory Visit: Payer: Self-pay | Admitting: Family Medicine

## 2020-10-09 DIAGNOSIS — J0140 Acute pansinusitis, unspecified: Secondary | ICD-10-CM

## 2020-10-26 ENCOUNTER — Other Ambulatory Visit (HOSPITAL_COMMUNITY): Payer: BC Managed Care – PPO

## 2020-10-30 ENCOUNTER — Ambulatory Visit (HOSPITAL_COMMUNITY): Admit: 2020-10-30 | Payer: BC Managed Care – PPO | Admitting: Gastroenterology

## 2020-10-30 ENCOUNTER — Encounter (HOSPITAL_COMMUNITY): Payer: Self-pay

## 2020-10-30 SURGERY — COLONOSCOPY WITH PROPOFOL
Anesthesia: Monitor Anesthesia Care

## 2020-10-31 ENCOUNTER — Encounter: Payer: BC Managed Care – PPO | Admitting: Gastroenterology

## 2020-11-07 ENCOUNTER — Telehealth: Payer: Self-pay | Admitting: Family Medicine

## 2020-11-07 DIAGNOSIS — I10 Essential (primary) hypertension: Secondary | ICD-10-CM

## 2020-11-07 MED ORDER — VALSARTAN 320 MG PO TABS: ORAL_TABLET | ORAL | 2 refills | Status: DC

## 2020-11-07 NOTE — Telephone Encounter (Signed)
Refill sent.

## 2020-11-07 NOTE — Telephone Encounter (Signed)
Medication: valsartan (DIOVAN) 320 MG tablet [[id number]]      Has the patient contacted their pharmacy? no (If no, request that the patient contact the pharmacy for the refill.) (If yes, when and what did the pharmacy advise?)    Preferred Pharmacy (with phone number or street name):   [facility] [place], [place] Phone:  [phone]  Fax:  [phone]         Agent: Please be advised that RX refills may take up to 3 business days. We ask that you follow-up with your pharmacy.

## 2021-02-07 ENCOUNTER — Other Ambulatory Visit: Payer: Self-pay | Admitting: Family Medicine

## 2021-02-07 DIAGNOSIS — I10 Essential (primary) hypertension: Secondary | ICD-10-CM

## 2021-02-20 ENCOUNTER — Encounter: Payer: Self-pay | Admitting: Family Medicine

## 2021-04-24 ENCOUNTER — Ambulatory Visit (INDEPENDENT_AMBULATORY_CARE_PROVIDER_SITE_OTHER): Payer: 59 | Admitting: Bariatrics

## 2021-05-08 ENCOUNTER — Ambulatory Visit (INDEPENDENT_AMBULATORY_CARE_PROVIDER_SITE_OTHER): Payer: 59 | Admitting: Bariatrics

## 2021-05-15 ENCOUNTER — Other Ambulatory Visit: Payer: Self-pay | Admitting: Family Medicine

## 2021-05-15 DIAGNOSIS — I10 Essential (primary) hypertension: Secondary | ICD-10-CM

## 2021-05-22 ENCOUNTER — Ambulatory Visit (INDEPENDENT_AMBULATORY_CARE_PROVIDER_SITE_OTHER): Payer: Self-pay | Admitting: Family Medicine

## 2021-05-29 ENCOUNTER — Ambulatory Visit: Payer: BC Managed Care – PPO | Admitting: Family Medicine

## 2021-05-29 ENCOUNTER — Other Ambulatory Visit: Payer: Self-pay

## 2021-05-29 ENCOUNTER — Encounter: Payer: Self-pay | Admitting: Family Medicine

## 2021-05-29 VITALS — BP 122/80 | HR 71 | Temp 98.9°F | Resp 18 | Ht 70.0 in | Wt 324.4 lb

## 2021-05-29 DIAGNOSIS — R0683 Snoring: Secondary | ICD-10-CM | POA: Insufficient documentation

## 2021-05-29 DIAGNOSIS — E669 Obesity, unspecified: Secondary | ICD-10-CM | POA: Diagnosis not present

## 2021-05-29 NOTE — Patient Instructions (Signed)
Sleep Apnea Sleep apnea is a condition in which breathing pauses or becomes shallow during sleep. People with sleep apnea usually snore loudly. They may have times when they gasp and stop breathing for 10 seconds or more during sleep. This mayhappen many times during the night. Sleep apnea disrupts your sleep and keeps your body from getting the rest that it needs. This condition can increase your risk of certain health problems, including: Heart attack. Stroke. Obesity. Type 2 diabetes. Heart failure. Irregular heartbeat. High blood pressure. The goal of treatment is to help you breathe normally again. What are the causes? The most common cause of sleep apnea is a collapsed or blocked airway. There are three kinds of sleep apnea: Obstructive sleep apnea. This kind is caused by a blocked or collapsed airway. Central sleep apnea. This kind happens when the part of the brain that controls breathing does not send the correct signals to the muscles that control breathing. Mixed sleep apnea. This is a combination of obstructive and central sleep apnea. What increases the risk? You are more likely to develop this condition if you: Are overweight. Smoke. Have a smaller than normal airway. Are older. Are male. Drink alcohol. Take sedatives or tranquilizers. Have a family history of sleep apnea. Have a tongue or tonsils that are larger than normal. What are the signs or symptoms? Symptoms of this condition include: Trouble staying asleep. Loud snoring. Morning headaches. Waking up gasping. Dry mouth or sore throat in the morning. Daytime sleepiness and tiredness. If you have daytime fatigue because of sleep apnea, you may be more likely to have: Trouble concentrating. Forgetfulness. Irritability or mood swings. Personality changes. Feelings of depression. Sexual dysfunction. This may include loss of interest if you are male, or erectile dysfunction if you are male. How is this  diagnosed? This condition may be diagnosed with: A medical history. A physical exam. A series of tests that are done while you are sleeping (sleep study). These tests are usually done in a sleep lab, but they may also be done at home. How is this treated? Treatment for this condition aims to restore normal breathing and to ease symptoms during sleep. It may involve managing health issues that can affect breathing, such as high blood pressure or obesity. Treatment may include: Sleeping on your side. Using a decongestant if you have nasal congestion. Avoiding the use of depressants, including alcohol, sedatives, and narcotics. Losing weight if you are overweight. Making changes to your diet. Quitting smoking. Using a device to open your airway while you sleep, such as: An oral appliance. This is a custom-made mouthpiece that shifts your lower jaw forward. A continuous positive airway pressure (CPAP) device. This device blows air through a mask when you breathe out (exhale). A nasal expiratory positive airway pressure (EPAP) device. This device has valves that you put into each nostril. A bi-level positive airway pressure (BPAP) device. This device blows air through a mask when you breathe in (inhale) and breathe out (exhale). Having surgery if other treatments do not work. During surgery, excess tissue is removed to create a wider airway. Follow these instructions at home: Lifestyle Make any lifestyle changes that your health care provider recommends. Eat a healthy, well-balanced diet. Take steps to lose weight if you are overweight. Avoid using depressants, including alcohol, sedatives, and narcotics. Do not use any products that contain nicotine or tobacco. These products include cigarettes, chewing tobacco, and vaping devices, such as e-cigarettes. If you need help quitting, ask your health  care provider. General instructions Take over-the-counter and prescription medicines only as told  by your health care provider. If you were given a device to open your airway while you sleep, use it only as told by your health care provider. If you are having surgery, make sure to tell your health care provider you have sleep apnea. You may need to bring your device with you. Keep all follow-up visits. This is important. Contact a health care provider if: The device that you received to open your airway during sleep is uncomfortable or does not seem to be working. Your symptoms do not improve. Your symptoms get worse. Get help right away if: You develop: Chest pain. Shortness of breath. Discomfort in your back, arms, or stomach. You have: Trouble speaking. Weakness on one side of your body. Drooping in your face. These symptoms may represent a serious problem that is an emergency. Do not wait to see if the symptoms will go away. Get medical help right away. Call your local emergency services (911 in the [place] Do not drive yourself to the hospital. Summary Sleep apnea is a condition in which breathing pauses or becomes shallow during sleep. The most common cause is a collapsed or blocked airway. The goal of treatment is to restore normal breathing and to ease symptoms during sleep. This information is not intended to replace advice given to you by your health care provider. Make sure you discuss any questions you have with your healthcare provider. Document Revised: 09/08/2020 Document Reviewed: 09/08/2020 Elsevier Patient Education  2022 [employer].

## 2021-05-29 NOTE — Assessment & Plan Note (Signed)
D/w healthy weight and wellness program

## 2021-05-29 NOTE — Assessment & Plan Note (Signed)
?   Sleep apnea Refer for sleep eval Discussed weight loss Pt exercises daily

## 2021-05-29 NOTE — Progress Notes (Signed)
Subjective:   By signing my name below, I, Shane Haney, attest that this documentation has been prepared under the direction and in the presence of Dr. Roma Schanz, DO. 05/29/2021    Patient ID: Shane Haney, male    DOB: 1961/03/17, 60 y.o.   MRN: [id number]  Chief Complaint  Patient presents with   Sleeping Concern    Pt states having snoring really loud. Pt states wife is concerned he stops breathing.     HPI Patient is in today for a office visit. He complains of snoring while sleeping and restlessness after sleeping for a while. He reports that his wife said he stops breathing while sleeping. He feels like he can sleep 12 hours and still feel tired. He also notes he drools excessively while sleeping and sometimes wakes up at night and finds his pillow soaked. He has not had a prior sleep study before.  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 Since then he has not breathed through his nose the same. He reports exercising regularly everyday. He does not manage a healthy diet at this time. He reports he is gaining weight even when limiting his calories and exercising regularly. He is requesting a program to help him manage his diet.  His blood pressure is doing well during this visit. He continues taking 320 mg valsartan daily PO and reports no new issues while taking it.   BP Readings from Last 3 Encounters:  05/29/21 122/80  05/18/20 (!) 142/90  12/08/18 128/84    Past Medical History:  Diagnosis Date   Diverticulosis    Dysrhythmia    "irregular at one time" (07/21/2013)   Gastroenteritis 08/20/2017   H/O echocardiogram 2010   seen by Dr. Stanford Breed- told to return as needed    Hyperlipidemia    Hypertension    Dr, Stanford Breed told pt. in 2010, ? extra beats.   Neuromuscular disorder ([place])    spinal cord compression, carpal tunnel - R hand  . Left 3 fingers remains with some tingling.   Osteoarthritis     "knees and wrists" (07/21/2013)   Snores     Past Surgical History:  Procedure Laterality Date   ANTERIOR CERVICAL DECOMP/DISCECTOMY FUSION N/A 07/08/2013   Procedure: ANTERIOR CERVICAL DECOMPRESSION/DISCECTOMY FUSION 2 LEVELS C3-C5;  Surgeon: Melina Schools, MD;  Location: [place];  Service: Orthopedics;  Laterality: N/A;   ANTERIOR CRUCIATE LIGAMENT REPAIR Left 1999   CARPAL TUNNEL RELEASE Left 05/2013   CARPAL TUNNEL RELEASE Right 07/08/2013   Procedure: LIMITED OPEN RIGHT CARPAL TUNNEL RELEASE;  Surgeon: Roseanne Kaufman, MD;  Location: [place];  Service: Orthopedics;  Laterality: Right;   INCISION AND DRAINAGE OF WOUND N/A 07/21/2013   Procedure: IRRIGATION AND DEBRIDEMENT OF CERVICAL WOUND;  Surgeon: Melina Schools, MD;  Location: [place];  Service: Orthopedics;  Laterality: N/A;   KNEE ARTHROSCOPY Right 2007   OPEN REDUCTION INTERNAL FIXATION (ORIF) FOOT LISFRANC FRACTURE Right 1993   R- /w ORIF   TONSILLECTOMY     TOTAL KNEE ARTHROPLASTY Left 04/11/2014   Procedure: LEFT TOTAL KNEE ARTHROPLASTY WITH HARDWARE REMOVAL;  Surgeon: [name], MD;  Location[facility];  Service: Orthopedics;  Laterality: Left;    Family History  Problem Relation Age of Onset   Stroke Father    Hypertension Father    Alcohol abuse Father    Hypertension Mother    Aneurysm Mother        Brain  HIV Brother        passed away from AIDS   Colon cancer Neg Hx    Colon polyps Neg Hx     Social History   Socioeconomic History   Marital status: Married    Spouse name: Not on file   Number of children: Not on file   Years of education: Not on file   Highest education level: Not on file  Occupational History   Occupation: Pharmacist, hospital--- [employer]    Employer: [employer]  Tobacco Use   Smoking status: Never   Smokeless tobacco: Never  Substance and Sexual Activity   Alcohol use: Yes    Alcohol/week: 4.0 standard drinks    Types: 2 Cans of beer, 2 Shots of liquor per week    Comment: 04-05-14 "3-5  mixed drinks or 6 beers on the weekends   Drug use: No   Sexual activity: Yes    Partners: Female  Other Topics Concern   Not on file  Social History Narrative   Exercise-- 5-6 days a week   Social Determinants of Health   Financial Resource Strain: Not on file  Food Insecurity: Not on file  Transportation Needs: Not on file  Physical Activity: Not on file  Stress: Not on file  Social Connections: Not on file  Intimate Partner Violence: Not on file    Outpatient Medications Prior to Visit  Medication Sig Dispense Refill   amoxicillin-clavulanate (AUGMENTIN) 875-125 MG tablet Take 1 tablet by mouth 2 (two) times daily. 20 tablet 0   azelastine (ASTELIN) 0.1 % nasal spray Place 2 sprays into both nostrils at bedtime as needed for rhinitis. Use in each nostril as directed 30 mL 3   fluticasone (FLONASE) 50 MCG/ACT nasal spray Place 2 sprays into both nostrils daily. 16 g 12   Multiple Vitamin (MULTIVITAMIN WITH MINERALS) TABS tablet Take 1 tablet by mouth daily.     simvastatin (ZOCOR) 20 MG tablet TAKE 1 TABLET(20 MG) BY MOUTH AT BEDTIME 30 tablet 2   valsartan (DIOVAN) 320 MG tablet TAKE 1 TABLET BY MOUTH EVERY DAY. Pt needs office visit for further refills 30 tablet 2   No facility-administered medications prior to visit.    No Known Allergies  Review of Systems  Constitutional:  Positive for malaise/fatigue. Negative for fever.  HENT:  Negative for congestion.   Eyes:  Negative for blurred vision.  Respiratory:  Negative for cough and shortness of breath.        (+)Snoring while sleeping  Cardiovascular:  Negative for chest pain, palpitations and leg swelling.  Gastrointestinal:  Negative for vomiting.  Musculoskeletal:  Negative for back pain.  Skin:  Negative for rash.  Neurological:  Negative for loss of consciousness and headaches.      Objective:    Physical Exam Vitals and nursing note reviewed.  Constitutional:      General: He is not in acute distress.     Appearance: Normal appearance. He is well-developed. He is not ill-appearing.  HENT:     Head: Normocephalic and atraumatic.     Right Ear: External ear normal.     Left Ear: External ear normal.  Eyes:     Extraocular Movements: Extraocular movements intact.     Pupils: Pupils are equal, round, and reactive to light.  Neck:     Thyroid: No thyromegaly.  Cardiovascular:     Rate and Rhythm: Normal rate and regular rhythm.     Heart sounds: Normal heart sounds. No  murmur heard.   No gallop.  Pulmonary:     Effort: Pulmonary effort is normal. No respiratory distress.     Breath sounds: Normal breath sounds. No wheezing or rales.  Chest:     Chest wall: No tenderness.  Musculoskeletal:     Cervical back: Normal range of motion and neck supple.     Right hip: Tenderness present. Normal range of motion. Normal strength.     Left hip: Tenderness present. Normal range of motion. Normal strength.     Right foot: Bony tenderness present. No swelling.     Left foot: Bony tenderness present. No swelling.  Skin:    General: Skin is warm and dry.  Neurological:     Mental Status: He is alert and oriented to person, place, and time.  Psychiatric:        Behavior: Behavior normal.        Thought Content: Thought content normal.        Judgment: Judgment normal.    BP 122/80 (BP Location: Right Arm, Patient Position: Sitting, Cuff Size: Large)   Pulse 71   Temp 98.9 F (37.2 C) (Oral)   Resp 18   Ht '5\' 10"'$  (1.778 m)   Wt (!) 324 lb 6.4 oz (147.1 kg)   SpO2 95%   BMI 46.55 kg/m  Wt Readings from Last 3 Encounters:  05/29/21 (!) 324 lb 6.4 oz (147.1 kg)  07/10/20 300 lb (136.1 kg)  05/18/20 300 lb (136.1 kg)    Diabetic Foot Exam - Simple   No data filed    Lab Results  Component Value Date   WBC 7.7 05/04/2018   HGB 15.8 05/04/2018   HCT 46.4 05/04/2018   PLT 245.0 05/04/2018   GLUCOSE 96 05/04/2018   CHOL 186 05/04/2018   TRIG 122.0 05/04/2018   HDL 35.80 (L)  05/04/2018   LDLDIRECT 159.5 12/09/2011   LDLCALC 126 (H) 05/04/2018   ALT 23 05/04/2018   AST 18 05/04/2018   NA 140 05/04/2018   K 4.2 05/04/2018   CL 103 05/04/2018   CREATININE 1.17 05/04/2018   BUN 15 05/04/2018   CO2 30 05/04/2018   TSH 2.43 05/04/2018   PSA 1.02 05/04/2018   INR 0.99 04/05/2014   HGBA1C 5.5 06/04/2013    Lab Results  Component Value Date   TSH 2.43 05/04/2018   Lab Results  Component Value Date   WBC 7.7 05/04/2018   HGB 15.8 05/04/2018   HCT 46.4 05/04/2018   MCV 94.5 05/04/2018   PLT 245.0 05/04/2018   Lab Results  Component Value Date   NA 140 05/04/2018   K 4.2 05/04/2018   CO2 30 05/04/2018   GLUCOSE 96 05/04/2018   BUN 15 05/04/2018   CREATININE 1.17 05/04/2018   BILITOT 0.8 05/04/2018   ALKPHOS 76 05/04/2018   AST 18 05/04/2018   ALT 23 05/04/2018   PROT 7.0 05/04/2018   ALBUMIN 4.5 05/04/2018   CALCIUM 9.8 05/04/2018   GFR 68.76 05/04/2018   Lab Results  Component Value Date   CHOL 186 05/04/2018   Lab Results  Component Value Date   HDL 35.80 (L) 05/04/2018   Lab Results  Component Value Date   LDLCALC 126 (H) 05/04/2018   Lab Results  Component Value Date   TRIG 122.0 05/04/2018   Lab Results  Component Value Date   CHOLHDL 5 05/04/2018   Lab Results  Component Value Date   HGBA1C 5.5 06/04/2013  Assessment & Plan:   Problem List Items Addressed This Visit       Unprioritized   Morbid obesity ([place])   Relevant Orders   Amb Ref to Medical Weight Management   Obesity (BMI 30-39.9)    D/w healthy weight and wellness program       Snoring - Primary    ? Sleep apnea Refer for sleep eval Discussed weight loss Pt exercises daily       Relevant Orders   Ambulatory referral to Neurology     No orders of the defined types were placed in this encounter.   I, Dr. Roma Schanz, DO, personally preformed the services described in this documentation.  All medical record entries made by the  scribe were at my direction and in my presence.  I have reviewed the chart and discharge instructions (if applicable) and agree that the record reflects my personal performance and is accurate and complete. 05/29/2021   I,Shane Haney,acting as a scribe for Ann Held, DO.,have documented all relevant documentation on the behalf of Ann Held, DO,as directed by  Ann Held, DO while in the presence of Ann Held, DO.   Ann Held, DO

## 2021-05-30 ENCOUNTER — Telehealth: Payer: Self-pay | Admitting: Family Medicine

## 2021-05-30 NOTE — Telephone Encounter (Signed)
Pt. Called and stated that appointment that was scheduled with sleep consultant isnt going to occur until end of Oct. He was informed by Dr Etter Sjogren to call back if appointment was far out to discuss another option: [phone]

## 2021-06-05 ENCOUNTER — Ambulatory Visit (INDEPENDENT_AMBULATORY_CARE_PROVIDER_SITE_OTHER): Payer: Self-pay | Admitting: Family Medicine

## 2021-06-21 ENCOUNTER — Other Ambulatory Visit: Payer: Self-pay

## 2021-06-22 ENCOUNTER — Encounter: Payer: Self-pay | Admitting: Family Medicine

## 2021-06-22 ENCOUNTER — Ambulatory Visit (INDEPENDENT_AMBULATORY_CARE_PROVIDER_SITE_OTHER): Payer: BC Managed Care – PPO | Admitting: Family Medicine

## 2021-06-22 VITALS — BP 142/86 | HR 67 | Temp 98.8°F | Resp 18 | Ht 70.0 in | Wt 322.4 lb

## 2021-06-22 DIAGNOSIS — Z1211 Encounter for screening for malignant neoplasm of colon: Secondary | ICD-10-CM | POA: Diagnosis not present

## 2021-06-22 DIAGNOSIS — Z23 Encounter for immunization: Secondary | ICD-10-CM

## 2021-06-22 DIAGNOSIS — T7840XA Allergy, unspecified, initial encounter: Secondary | ICD-10-CM

## 2021-06-22 DIAGNOSIS — E785 Hyperlipidemia, unspecified: Secondary | ICD-10-CM | POA: Diagnosis not present

## 2021-06-22 DIAGNOSIS — Z0000 Encounter for general adult medical examination without abnormal findings: Secondary | ICD-10-CM

## 2021-06-22 DIAGNOSIS — I10 Essential (primary) hypertension: Secondary | ICD-10-CM | POA: Diagnosis not present

## 2021-06-22 DIAGNOSIS — Z125 Encounter for screening for malignant neoplasm of prostate: Secondary | ICD-10-CM | POA: Diagnosis not present

## 2021-06-22 DIAGNOSIS — R0683 Snoring: Secondary | ICD-10-CM

## 2021-06-22 LAB — CBC WITH DIFFERENTIAL/PLATELET
Basophils Absolute: 0 10*3/uL (ref 0.0–0.1)
Basophils Relative: 0.7 % (ref 0.0–3.0)
Eosinophils Absolute: 0.3 10*3/uL (ref 0.0–0.7)
Eosinophils Relative: 5.3 % — ABNORMAL HIGH (ref 0.0–5.0)
HCT: 45.5 % (ref 39.0–52.0)
Hemoglobin: 15.5 g/dL (ref 13.0–17.0)
Lymphocytes Relative: 35.7 % (ref 12.0–46.0)
Lymphs Abs: 2.2 10*3/uL (ref 0.7–4.0)
MCHC: 34.1 g/dL (ref 30.0–36.0)
MCV: 93.4 fl (ref 78.0–100.0)
Monocytes Absolute: 0.6 10*3/uL (ref 0.1–1.0)
Monocytes Relative: 9.6 % (ref 3.0–12.0)
Neutro Abs: 3 10*3/uL (ref 1.4–7.7)
Neutrophils Relative %: 48.7 % (ref 43.0–77.0)
Platelets: 228 10*3/uL (ref 150.0–400.0)
RBC: 4.87 Mil/uL (ref 4.22–5.81)
RDW: 12.7 % (ref 11.5–15.5)
WBC: 6.2 10*3/uL (ref 4.0–10.5)

## 2021-06-22 LAB — LIPID PANEL
Cholesterol: 185 mg/dL (ref 0–200)
HDL: 37 mg/dL — ABNORMAL LOW (ref >39.00)
LDL Cholesterol: 131 mg/dL — ABNORMAL HIGH (ref 0–99)
NonHDL: 148.41
Total CHOL/HDL Ratio: 5
Triglycerides: 89 mg/dL (ref 0.0–149.0)
VLDL: 17.8 mg/dL (ref 0.0–40.0)

## 2021-06-22 LAB — COMPREHENSIVE METABOLIC PANEL
ALT: 19 U/L (ref 0–53)
AST: 18 U/L (ref 0–37)
Albumin: 4.3 g/dL (ref 3.5–5.2)
Alkaline Phosphatase: 64 U/L (ref 39–117)
BUN: 13 mg/dL (ref 6–23)
CO2: 29 mEq/L (ref 19–32)
Calcium: 9.4 mg/dL (ref 8.4–10.5)
Chloride: 102 mEq/L (ref 96–112)
Creatinine, Ser: 0.92 mg/dL (ref 0.40–1.50)
GFR: 91.85 mL/min (ref >60.00)
Glucose, Bld: 95 mg/dL (ref 70–99)
Potassium: 4 mEq/L (ref 3.5–5.1)
Sodium: 140 mEq/L (ref 135–145)
Total Bilirubin: 0.8 mg/dL (ref 0.2–1.2)
Total Protein: 6.4 g/dL (ref 6.0–8.3)

## 2021-06-22 LAB — PSA: PSA: 1.08 ng/mL (ref 0.10–4.00)

## 2021-06-22 LAB — TSH: TSH: 2.1 u[IU]/mL (ref 0.35–5.50)

## 2021-06-22 MED ORDER — FLUTICASONE PROPIONATE 50 MCG/ACT NA SUSP: 2.0000 | Freq: Every day | NASAL | 6 refills | Status: DC

## 2021-06-22 NOTE — Assessment & Plan Note (Signed)
Refer to healthy weight and wellness con't with exercise

## 2021-06-22 NOTE — Progress Notes (Signed)
Subjective:   By signing my name below, I, Zite Okoli, attest that this documentation has been prepared under the direction and in the presence of Ann Held, DO. 06/22/2021      Patient ID: Shane Haney, male    DOB: Mar 22, 1961, 60 y.o.   MRN: [id number]  Chief Complaint  Patient presents with  . Annual Exam    Concerns/ questions: pt says neurology's 1st appointment isn't until November.Should he see Pulmonology?  HIV screen due Flu: yes Zoster: pt thinks he has had 1. Tdap: unsure- not in [facility] -Pt is fasting Colonoscopy:    HPI Patient is in today for a comprehensive physical exam.  He mentions he is doing well.  He reports that he started seeing a chiropractor to help with his back pain and suspects he has a pinched nerve. He had two steroid injections during the spring but they provided little relief. After the X-ray was done at the chiropractor, he found out the last disc in his back is sitting on his tailbone. He also mentions the back pain radiates down to his hip and groin and also feels a tightness in his groin and quads.  He also reports that 30 years ago, he had a nose surgery after breaking his nose. He suspects that it has made his snoring worse and he is often congested. He would like some medication to help.  He wanted to start the Healthy Weight and Wellness program to help with sleep apnea but there has been no recent openings. He is also trying to see a pulmonologist.   He is also trying to get a handicap pass because of his back and right knee pain.  He denies fever, hearing loss, ear pain, sinus pain, sore throat, eye pain, chest pain, palpitations, cough, shortness of breath, wheezing, nausea. vomiting, diarrhea, constipation, blood in stool, dysuria,frequency, hematuria and headaches.   He exercises by swimming regularly.  He is UTD on vision and dental care.  He is interested in getting the flu and tetanus vaccine today. He will be  getting the shingles vaccine later. He has 3 Pfizer Covid-19 vaccines at this time. He does not want to do a HIV screening.   There has been no recent changes in the family history.  Past Medical History:  Diagnosis Date  . Diverticulosis   . Dysrhythmia    "irregular at one time" (07/21/2013)  . Gastroenteritis 08/20/2017  . H/O echocardiogram 2010   seen by Dr. Stanford Breed- told to return as needed   . Hyperlipidemia   . Hypertension    Dr, Stanford Breed told pt. in 2010, ? extra beats.  . Neuromuscular disorder ([place])    spinal cord compression, carpal tunnel - R hand  . Left 3 fingers remains with some tingling.  . Osteoarthritis    "knees and wrists" (07/21/2013)  . Snores     Past Surgical History:  Procedure Laterality Date  . ANTERIOR CERVICAL DECOMP/DISCECTOMY FUSION N/A 07/08/2013   Procedure: ANTERIOR CERVICAL DECOMPRESSION/DISCECTOMY FUSION 2 LEVELS C3-C5;  Surgeon: Melina Schools, MD;  Location: [place];  Service: Orthopedics;  Laterality: N/A;  . ANTERIOR CRUCIATE LIGAMENT REPAIR Left 1999  . CARPAL TUNNEL RELEASE Left 05/2013  . CARPAL TUNNEL RELEASE Right 07/08/2013   Procedure: LIMITED OPEN RIGHT CARPAL TUNNEL RELEASE;  Surgeon: Roseanne Kaufman, MD;  Location: [place];  Service: Orthopedics;  Laterality: Right;  . INCISION AND DRAINAGE OF WOUND N/A 07/21/2013   Procedure: IRRIGATION AND DEBRIDEMENT OF CERVICAL WOUND;  Surgeon: Melina Schools, MD;  Location: [place] [facility];  Service: Orthopedics;  Laterality: N/A;  . KNEE ARTHROSCOPY Right 2007  . OPEN REDUCTION INTERNAL FIXATION (ORIF) FOOT LISFRANC FRACTURE Right 1993   R- /w ORIF  . TONSILLECTOMY    . TOTAL KNEE ARTHROPLASTY Left 04/11/2014   Procedure: LEFT TOTAL KNEE ARTHROPLASTY WITH HARDWARE REMOVAL;  Surgeon: [name], MD;  [facility];  Service: Orthopedics;  Laterality: Left;    Family History  Problem Relation Age of Onset  . Stroke Father   . Hypertension Father   . Alcohol abuse Father   . Hypertension Mother   .  Aneurysm Mother        Brain  . HIV Brother        passed away from AIDS  . Colon cancer Neg Hx   . Colon polyps Neg Hx     Social History   Socioeconomic History  . Marital status: Married    Spouse name: Not on file  . Number of children: Not on file  . Years of education: Not on file  . Highest education level: Not on file  Occupational History  . Occupation: Pharmacist, hospital--- [employer]    Employer: [employer]  Tobacco Use  . Smoking status: Never  . Smokeless tobacco: Never  Substance and Sexual Activity  . Alcohol use: Yes    Alcohol/week: 4.0 standard drinks    Types: 2 Cans of beer, 2 Shots of liquor per week    Comment: 04-05-14 "3-5 mixed drinks or 6 beers on the weekends  . Drug use: No  . Sexual activity: Yes    Partners: Female  Other Topics Concern  . Not on file  Social History Narrative   Exercise-- 5-6 days a week   Social Determinants of Health   Financial Resource Strain: Not on file  Food Insecurity: Not on file  Transportation Needs: Not on file  Physical Activity: Not on file  Stress: Not on file  Social Connections: Not on file  Intimate Partner Violence: Not on file    Outpatient Medications Prior to Visit  Medication Sig Dispense Refill  . azelastine (ASTELIN) 0.1 % nasal spray Place 2 sprays into both nostrils at bedtime as needed for rhinitis. Use in each nostril as directed 30 mL 3  . fluticasone (FLONASE) 50 MCG/ACT nasal spray Place 2 sprays into both nostrils daily. 16 g 12  . Multiple Vitamin (MULTIVITAMIN WITH MINERALS) TABS tablet Take 1 tablet by mouth daily.    . simvastatin (ZOCOR) 20 MG tablet TAKE 1 TABLET(20 MG) BY MOUTH AT BEDTIME 30 tablet 2  . valsartan (DIOVAN) 320 MG tablet TAKE 1 TABLET BY MOUTH EVERY DAY. Pt needs office visit for further refills 30 tablet 2  . amoxicillin-clavulanate (AUGMENTIN) 875-125 MG tablet Take 1 tablet by mouth 2 (two) times daily. (Patient not taking: Reported on 06/22/2021) 20 tablet 0   No  facility-administered medications prior to visit.    No Known Allergies  Review of Systems  Constitutional:  Negative for fever.  HENT:  Negative for ear pain, hearing loss, sinus pain and sore throat.   Eyes:  Negative for pain.  Respiratory:  Negative for cough, shortness of breath and wheezing.   Cardiovascular:  Negative for chest pain and palpitations.  Gastrointestinal:  Negative for blood in stool, constipation, diarrhea, nausea and vomiting.  Genitourinary:  Negative for dysuria, frequency and hematuria.  Musculoskeletal:  Positive for back pain (radiates down to hip and groin) and  joint pain (right knee).  Neurological:  Negative for headaches.      Objective:    Physical Exam Constitutional:      General: He is not in acute distress.    Appearance: Normal appearance. He is well-developed. He is not ill-appearing or diaphoretic.  HENT:     Head: Normocephalic and atraumatic.     Right Ear: Tympanic membrane, ear canal and external ear normal.     Left Ear: Tympanic membrane, ear canal and external ear normal.     Nose: Nose normal.     Mouth/Throat:     Pharynx: No oropharyngeal exudate.  Eyes:     General:        Right eye: No discharge.        Left eye: No discharge.     Conjunctiva/sclera: Conjunctivae normal.     Pupils: Pupils are equal, round, and reactive to light.  Neck:     Thyroid: No thyromegaly.     Vascular: No JVD.  Cardiovascular:     Rate and Rhythm: Normal rate and regular rhythm.     Pulses: Normal pulses.     Heart sounds: No murmur heard.   No friction rub. No gallop.  Pulmonary:     Effort: Pulmonary effort is normal. No respiratory distress.     Breath sounds: Normal breath sounds. No wheezing, rhonchi or rales.  Chest:     Chest wall: No tenderness.  Abdominal:     General: Bowel sounds are normal. There is no distension.     Palpations: Abdomen is soft. There is no mass.     Tenderness: There is no abdominal tenderness. There is no  guarding or rebound.     Hernia: No hernia is present.  Genitourinary:    Comments: Pt did not want gu exam today  Musculoskeletal:        General: No tenderness. Normal range of motion.     Cervical back: Normal range of motion and neck supple.  Lymphadenopathy:     Cervical: No cervical adenopathy.  Skin:    General: Skin is warm and dry.     Coloration: Skin is not pale.     Findings: No erythema or rash.     Comments: Sun spots on left arm  Neurological:     Mental Status: He is alert and oriented to person, place, and time.     Motor: No abnormal muscle tone.     Deep Tendon Reflexes: Reflexes are normal and symmetric. Reflexes normal.  Psychiatric:        Behavior: Behavior normal.        Thought Content: Thought content normal.        Judgment: Judgment normal.    BP (!) 142/86 (BP Location: Left Arm, Patient Position: Sitting, Cuff Size: Large)   Pulse 67   Temp 98.8 F (37.1 C) (Oral)   Resp 18   Ht '5\' 10"'$  (1.778 m)   Wt (!) 322 lb 6.4 oz (146.2 kg)   SpO2 96%   BMI 46.26 kg/m  Wt Readings from Last 3 Encounters:  06/22/21 (!) 322 lb 6.4 oz (146.2 kg)  05/29/21 (!) 324 lb 6.4 oz (147.1 kg)  07/10/20 300 lb (136.1 kg)    Diabetic Foot Exam - Simple   No data filed    Lab Results  Component Value Date   WBC 7.7 05/04/2018   HGB 15.8 05/04/2018   HCT 46.4 05/04/2018   PLT 245.0 05/04/2018   GLUCOSE  96 05/04/2018   CHOL 186 05/04/2018   TRIG 122.0 05/04/2018   HDL 35.80 (L) 05/04/2018   LDLDIRECT 159.5 12/09/2011   LDLCALC 126 (H) 05/04/2018   ALT 23 05/04/2018   AST 18 05/04/2018   NA 140 05/04/2018   K 4.2 05/04/2018   CL 103 05/04/2018   CREATININE 1.17 05/04/2018   BUN 15 05/04/2018   CO2 30 05/04/2018   TSH 2.43 05/04/2018   PSA 1.02 05/04/2018   INR 0.99 04/05/2014   HGBA1C 5.5 06/04/2013    Lab Results  Component Value Date   TSH 2.43 05/04/2018   Lab Results  Component Value Date   WBC 7.7 05/04/2018   HGB 15.8 05/04/2018    HCT 46.4 05/04/2018   MCV 94.5 05/04/2018   PLT 245.0 05/04/2018   Lab Results  Component Value Date   NA 140 05/04/2018   K 4.2 05/04/2018   CO2 30 05/04/2018   GLUCOSE 96 05/04/2018   BUN 15 05/04/2018   CREATININE 1.17 05/04/2018   BILITOT 0.8 05/04/2018   ALKPHOS 76 05/04/2018   AST 18 05/04/2018   ALT 23 05/04/2018   PROT 7.0 05/04/2018   ALBUMIN 4.5 05/04/2018   CALCIUM 9.8 05/04/2018   GFR 68.76 05/04/2018   Lab Results  Component Value Date   CHOL 186 05/04/2018   Lab Results  Component Value Date   HDL 35.80 (L) 05/04/2018   Lab Results  Component Value Date   LDLCALC 126 (H) 05/04/2018   Lab Results  Component Value Date   TRIG 122.0 05/04/2018   Lab Results  Component Value Date   CHOLHDL 5 05/04/2018   Lab Results  Component Value Date   HGBA1C 5.5 06/04/2013        Colonoscopy: Last completed on 05/30/2015. Results showed mild diverticulosis in the descending, sigmoid, ascending and transverse colon. There was a sessile polyp removed with a cold snare and Grade I internal hemorrhoids. Repeat in 5-10 years. PSA: Last  completed on 05/04/2018. Results were 1.02.   Assessment & Plan:   Problem List Items Addressed This Visit       Unprioritized   Allergies    Refill flonase Pt with hx nose surgery --- may need ent referral       Relevant Medications   fluticasone (FLONASE) 50 MCG/ACT nasal spray   Colon cancer screening - Primary   Relevant Orders   Ambulatory referral to Gastroenterology   Hyperlipidemia    Encourage heart healthy diet such as MIND or DASH diet, increase exercise, avoid trans fats, simple carbohydrates and processed foods, consider a krill or fish or flaxseed oil cap daily.       Relevant Orders   Lipid panel   CBC with Differential/Platelet   Comprehensive metabolic panel   TSH   Morbid obesity ([place])    Refer to healthy weight and wellness con't with exercise      Relevant Orders   Amb Ref to Medical Weight  Management   Primary hypertension    Well controlled, no changes to meds. Encouraged heart healthy diet such as the DASH diet and exercise as tolerated.       Relevant Orders   Lipid panel   CBC with Differential/Platelet   Comprehensive metabolic panel   TSH   Snoring    Neuro pending for sleep study      Other Visit Diagnoses     Need for influenza vaccination       Relevant Orders  Flu Vaccine QUAD 71moIM (Fluarix, Fluzone & Alfiuria Quad PF) (Completed)   Need for tetanus booster       Relevant Orders   Tdap vaccine greater than or equal to 7yo IM (Completed)        Meds ordered this encounter  Medications  . fluticasone (FLONASE) 50 MCG/ACT nasal spray    Sig: Place 2 sprays into both nostrils daily.    Dispense:  16 g    Refill:  6    I,Zite Okoli,acting as a scribe for [employer] DO.,have documented all relevant documentation on the behalf of YAnn Held DO,as directed by  YAnn Held DO while in the presence of YAnn Held DO.   I, YAnn Held DO., personally preformed the services described in this documentation.  All medical record entries made by the scribe were at my direction and in my presence.  I have reviewed the chart and discharge instructions (if applicable) and agree that the record reflects my personal performance and is accurate and complete. 06/22/2021

## 2021-06-22 NOTE — Assessment & Plan Note (Signed)
Well controlled, no changes to meds. Encouraged heart healthy diet such as the DASH diet and exercise as tolerated.  °

## 2021-06-22 NOTE — Patient Instructions (Signed)
Preventive Care 40-58 Years Old, Male Preventive care refers to lifestyle choices and visits with your health care provider that can promote health and wellness. This includes: A yearly physical exam. This is also called an annual wellness visit. Regular dental and eye exams. Immunizations. Screening for certain conditions. Healthy lifestyle choices, such as: Eating a healthy diet. Getting regular exercise. Not using drugs or products that contain nicotine and tobacco. Limiting alcohol use. What can I expect for my preventive care visit? Physical exam Your health care provider will check your: Height and weight. These may be used to calculate your BMI (body mass index). BMI is a measurement that tells if you are at a healthy weight. Heart rate and blood pressure. Body temperature. Skin for abnormal spots. Counseling Your health care provider may ask you questions about your: Past medical problems. Family's medical history. Alcohol, tobacco, and drug use. Emotional well-being. Home life and relationship well-being. Sexual activity. Diet, exercise, and sleep habits. Work and work environment. Access to firearms. What immunizations do I need? Vaccines are usually given at various ages, according to a schedule. Your health care provider will recommend vaccines for you based on your age, medical history, and lifestyle or other factors, such as travel or where you work. What tests do I need? Blood tests Lipid and cholesterol levels. These may be checked every 5 years, or more often if you are over 50 years old. Hepatitis C test. Hepatitis B test. Screening Lung cancer screening. You may have this screening every year starting at age 55 if you have a 30-pack-year history of smoking and currently smoke or have quit within the past 15 years. Prostate cancer screening. Recommendations will vary depending on your family history and other risks. Genital exam to check for testicular cancer  or hernias. Colorectal cancer screening. All adults should have this screening starting at age 50 and continuing until age 75. Your health care provider may recommend screening at age 45 if you are at increased risk. You will have tests every 1-10 years, depending on your results and the type of screening test. Diabetes screening. This is done by checking your blood sugar (glucose) after you have not eaten for a while (fasting). You may have this done every 1-3 years. STD (sexually transmitted disease) testing, if you are at risk. Follow these instructions at home: Eating and drinking  Eat a diet that includes fresh fruits and vegetables, whole grains, lean protein, and low-fat dairy products. Take vitamin and mineral supplements as recommended by your health care provider. Do not drink alcohol if your health care provider tells you not to drink. If you drink alcohol: Limit how much you have to 0-2 drinks a day. Be aware of how much alcohol is in your drink. In the [place] one drink equals one 12 oz bottle of beer (355 mL), one 5 oz glass of wine (148 mL), or one 1 oz glass of hard liquor (44 mL). Lifestyle Take daily care of your teeth and gums. Brush your teeth every morning and night with fluoride toothpaste. Floss one time each day. Stay active. Exercise for at least 30 minutes 5 or more days each week. Do not use any products that contain nicotine or tobacco, such as cigarettes, e-cigarettes, and chewing tobacco. If you need help quitting, ask your health care provider. Do not use drugs. If you are sexually active, practice safe sex. Use a condom or other form of protection to prevent STIs (sexually transmitted infections). If told by your   health care provider, take low-dose aspirin daily starting at age 50. Find healthy ways to cope with stress, such as: Meditation, yoga, or listening to music. Journaling. Talking to a trusted person. Spending time with friends and  family. Safety Always wear your seat belt while driving or riding in a vehicle. Do not drive: If you have been drinking alcohol. Do not ride with someone who has been drinking. When you are tired or distracted. While texting. Wear a helmet and other protective equipment during sports activities. If you have firearms in your house, make sure you follow all gun safety procedures. What's next? Go to your health care provider once a year for an annual wellness visit. Ask your health care provider how often you should have your eyes and teeth checked. Stay up to date on all vaccines. This information is not intended to replace advice given to you by your health care provider. Make sure you discuss any questions you have with your health care provider. Document Revised: 12/08/2020 Document Reviewed: 09/24/2018 Elsevier Patient Education  2022 Elsevier Inc.   

## 2021-06-22 NOTE — Assessment & Plan Note (Signed)
Encourage heart healthy diet such as MIND or DASH diet, increase exercise, avoid trans fats, simple carbohydrates and processed foods, consider a krill or fish or flaxseed oil cap daily.  °

## 2021-06-22 NOTE — Assessment & Plan Note (Signed)
Neuro pending for sleep study

## 2021-06-22 NOTE — Assessment & Plan Note (Signed)
Refill flonase Pt with hx nose surgery --- may need ent referral

## 2021-07-13 ENCOUNTER — Other Ambulatory Visit: Payer: Self-pay | Admitting: Family Medicine

## 2021-07-13 DIAGNOSIS — I10 Essential (primary) hypertension: Secondary | ICD-10-CM

## 2021-08-08 ENCOUNTER — Encounter: Payer: Self-pay | Admitting: Neurology

## 2021-08-08 ENCOUNTER — Ambulatory Visit: Payer: BC Managed Care – PPO | Admitting: Neurology

## 2021-08-08 ENCOUNTER — Other Ambulatory Visit: Payer: Self-pay

## 2021-08-08 VITALS — BP 172/97 | HR 74 | Ht 70.0 in | Wt 323.0 lb

## 2021-08-08 DIAGNOSIS — I10 Essential (primary) hypertension: Secondary | ICD-10-CM

## 2021-08-08 DIAGNOSIS — R0683 Snoring: Secondary | ICD-10-CM

## 2021-08-08 DIAGNOSIS — Z6841 Body Mass Index (BMI) 40.0 and over, adult: Secondary | ICD-10-CM

## 2021-08-08 DIAGNOSIS — J0121 Acute recurrent ethmoidal sinusitis: Secondary | ICD-10-CM

## 2021-08-08 DIAGNOSIS — G4719 Other hypersomnia: Secondary | ICD-10-CM | POA: Diagnosis not present

## 2021-08-08 DIAGNOSIS — G4730 Sleep apnea, unspecified: Secondary | ICD-10-CM

## 2021-08-08 MED ORDER — TRIAMCINOLONE ACETONIDE 55 MCG/ACT NA AERO: 2.0000 | INHALATION_SPRAY | Freq: Every day | NASAL | 12 refills | Status: DC

## 2021-08-08 NOTE — Progress Notes (Signed)
SLEEP MEDICINE CLINIC    Provider:  Larey Seat, MD  Primary Care Physician:  Ann Held, DO [address] [place] [address]     Referring Provider: Carollee Herter, [address] [place],  [place] [address]          Chief Complaint according to patient   Patient presents with:     New Patient (Initial Visit)           HISTORY OF PRESENT ILLNESS:  Shane Haney is a 60 y.o. year old White or Caucasian male patient seen here as a referral on 08/08/2021 from PCP for a Sleep consultation. .  Chief concern according to patient :  Too sleepy, wife witnessed apneas, snoring. Nocturia and headaches reported.    I have the pleasure of seeing Shane Haney today, a right-handed  Caucasian male with a possible sleep disorder.  She has a  has a past medical history of Diverticulosis, cardia -Dysrhythmia, Gastroenteritis (08/20/2017), H/O echocardiogram (2010), Hyperlipidemia, Hypertension, Osteoarthritis, and Snores.   Sleep relevant medical history: Morbid obesity, SOB, Nocturia 2-5 , had Tonsillectomy and sinus surgery, cervical spine surgery/anterior fusion , 7 years ago, knee 8 years ago,  deviated septum- sport accident- cant breathe through his nose.     Family medical /sleep history:  Brothers on CPAP with OSA, father may have had it.    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 Family status is married , with 2 children, at home, 2 dogs , 2 cats.  The patient currently works. Tobacco use; none .  ETOH use ; vodka, weekends,  Caffeine intake in form of Coffee( 2 cups in AM ) Soda(  5-6 days a week-) Tea ( /) or energy drinks. Regular exercise daily..    Sleep habits are as follows: The patient's dinner time is between 6-7  PM. The patient goes to bed at 11 PM and continues to sleep for intervals of 2  hours.   The preferred sleep position  is right sided, with the support of 2 pillows.  Dreams are reportedly frequent.  7  AM is the usual rise time. The patient wakes up spontaneously 6.30-45  He reports not feeling refreshed or restored in AM, with symptoms such as dry mouth, morning headaches, and residual fatigue.  The patient reports being able to sleep whenever not physically active or mentally stimulated.  If he is relaxed and at rest and especially after a meal he is likely to fall asleep.  Naps are taken frequently, lasting from 30- to 60 minutes and are more refreshing than nocturnal sleep.    Review of Systems: Out of a complete 14 system review, the patient complains of only the following symptoms, and all other reviewed systems are negative.:  Fatigue, sleepiness , snoring, fragmented sleep, NOCTURIA, louder snoring since weight gain.    How likely are you to doze in the following situations: 0 = not likely, 1 = slight chance, 2 = moderate chance, 3 = high chance   Sitting and Reading? Watching Television? Sitting inactive in a public place (theater or meeting)? As a passenger in a car for an hour without a break? Lying down in the afternoon when circumstances permit? Sitting and talking to someone? Sitting quietly after lunch without alcohol? In a car, while stopped for a few minutes  in traffic?   Total =11 / 24 points   FSS endorsed at 28/ 63 points.   Social History   Socioeconomic History   Marital status: Married    Spouse name: Billi   Number of children: 2   Years of education: Not on file   Highest education level: Bachelor's degree (e.g., BA, AB, BS)  Occupational History   Occupation: Pharmacist, hospital--- [employer]    Employer: [employer]  Tobacco Use   Smoking status: Never   Smokeless tobacco: Never  Substance and Sexual Activity   Alcohol use: Yes    Alcohol/week: 4.0 standard drinks    Types: 2 Cans of beer, 2 Shots of liquor per week    Comment: 04-05-14 "3-5 mixed drinks or 6 beers on  the weekends   Drug use: No   Sexual activity: Yes    Partners: Female  Other Topics Concern   Not on file  Social History Narrative   Lives with wife and 2 children   Ambidextrous, eats and writes r handed. Throws and kicks w left hand/foot   Caffeine: 2 cups of coffee a day, diet mtn dew before workout      Social Determinants of Health   Financial Resource Strain: Not on file  Food Insecurity: Not on file  Transportation Needs: Not on file  Physical Activity: Not on file  Stress: Not on file  Social Connections: Not on file    Family History  Problem Relation Age of Onset   Stroke Father    Hypertension Father    Alcohol abuse Father    Hypertension Mother    Aneurysm Mother        Brain   HIV Brother        passed away from AIDS   Colon cancer Neg Hx    Colon polyps Neg Hx     Past Medical History:  Diagnosis Date   Diverticulosis    Dysrhythmia    "irregular at one time" (07/21/2013)   Gastroenteritis 08/20/2017   H/O echocardiogram 2010   seen by Dr. Stanford Breed- told to return as needed    Hyperlipidemia    Hypertension    Dr, Stanford Breed told pt. in 2010, ? extra beats.   DDD,cervical- ant fusion. Lumbar spine  pain.     spinal cord compression, carpal tunnel - R hand  . Left 3 fingers remains with some tingling.   Osteoarthritis    "knees and wrists" (07/21/2013)   Snores     Past Surgical History:  Procedure Laterality Date   ANTERIOR CERVICAL DECOMP/DISCECTOMY FUSION N/A 07/08/2013   Procedure: ANTERIOR CERVICAL DECOMPRESSION/DISCECTOMY FUSION 2 LEVELS C3-C5;  Surgeon: Melina Schools, MD;  Location: [facility];  Service: Orthopedics;  Laterality: N/A;   ANTERIOR CRUCIATE LIGAMENT REPAIR Left 1999   CARPAL TUNNEL RELEASE Left 05/2013   CARPAL TUNNEL RELEASE Right 07/08/2013   Procedure: LIMITED OPEN RIGHT CARPAL TUNNEL RELEASE;  Surgeon: Roseanne Kaufman, MD;  Location: [facility];  Service: Orthopedics;  Laterality: Right;   INCISION AND DRAINAGE OF WOUND N/A 07/21/2013    Procedure: IRRIGATION AND DEBRIDEMENT OF CERVICAL WOUND;  Surgeon: Melina Schools, MD;  Location: [facility];  Service: Orthopedics;  Laterality: N/A;   KNEE ARTHROSCOPY Right 2007   OPEN REDUCTION INTERNAL FIXATION (ORIF) FOOT LISFRANC FRACTURE Right 1993   R- /w ORIF   TONSILLECTOMY     TOTAL KNEE ARTHROPLASTY Left 04/11/2014   Procedure: LEFT TOTAL KNEE ARTHROPLASTY WITH HARDWARE REMOVAL;  Surgeon: [name], MD;  Location: [facility];  Service: Orthopedics;  Laterality: Left;     Current Outpatient Medications on File Prior to Visit  Medication Sig Dispense Refill   fluticasone (FLONASE) 50 MCG/ACT nasal spray Place 2 sprays into both nostrils daily. 16 g 12   Multiple Vitamin (MULTIVITAMIN WITH MINERALS) TABS tablet Take 1 tablet by mouth daily.     valsartan (DIOVAN) 320 MG tablet TAKE 1 TABLET BY MOUTH DAILY 90 tablet 1   No current facility-administered medications on file prior to visit.    No Known Allergies  Physical exam:  Today's Vitals   08/08/21 0843  BP: (!) 172/97  Pulse: 74  Weight: (!) 323 lb (146.5 kg)  Height: 5\' 10"  (1.778 m)   Body mass index is 46.35 kg/m.   Wt Readings from Last 3 Encounters:  08/08/21 (!) 323 lb (146.5 kg)  06/22/21 (!) 322 lb 6.4 oz (146.2 kg)  05/29/21 (!) 324 lb 6.4 oz (147.1 kg)     Ht Readings from Last 3 Encounters:  08/08/21 5\' 10"  (1.778 m)  06/22/21 5\' 10"  (1.778 m)  05/29/21 5\' 10"  (1.778 m)      General: The patient is awake, alert and appears not in acute distress. The patient is well groomed. Head: Normocephalic, atraumatic. Neck is supple. Mallampati 3 plus ,  neck circumference:22 inches . Nasal airflow NOT patent.  Retrognathia is not seen.  Dental status: intact  Cardiovascular:  Regular rate and cardiac rhythm by pulse,  without distended neck veins. Respiratory: Lungs are clear to auscultation.  Skin:  Without evidence of ankle edema, or rash. Trunk: The patient's posture is erect.   Neurologic exam : The  patient is awake and alert, oriented to place and time.   Memory subjective described as intact.  Attention span & concentration ability appears normal.  Speech is fluent,  without  dysarthria, dysphonia or aphasia.  Mood and affect are appropriate.   Cranial nerves: no loss of smell or taste reported  Pupils are equal and briskly reactive to light. Funduscopic exam deferred. .  Extraocular movements in vertical and horizontal planes were intact and without nystagmus.  No Diplopia. Visual fields by finger perimetry are intact. Hearing was intact to soft voice and finger rubbing.    Facial sensation intact to fine touch.  Facial motor strength is symmetric and tongue and uvula move midline.  Neck ROM : rotation is full , tilt is limited and flexion extension were normal for age and shoulder shrug was symmetrical.    Motor exam:  Symmetric bulk, tone and ROM.   Normal tone without cog wheeling, symmetric grip strength .   Sensory:  Fine touch, pinprick and vibration were tested  and  normal.  Proprioception tested in the upper extremities was normal.   Coordination: Rapid alternating movements in the fingers/hands were of normal speed.  The Finger-to-nose maneuver was intact without evidence of ataxia, dysmetria or tremor.   Gait and station: Patient could rise unassisted from a seated position, walked without assistive device.  Stance is of normal width/ base and the patient turned with 3 steps.  Toe and heel walk were deferred.  Deep tendon reflexes: in the  upper and lower extremities are trace reflexes only. Total knee on the left.  Babinski response was deferred.       After spending a total time of  45  minutes face to face and additional time for physical and neurologic examination, review of laboratory studies,  personal review of imaging  studies, reports and results of other testing and review of referral information / records as far as provided in visit, I have established  the following assessments:  He is a regular exerciser so he is not diabetic HbA1c was 5.5.  LDL was slightly elevated which may also be an inherited factor.   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  Very restricted upper airway and nasal airflow making the patient essentially a mouth breather.  Large neck.  So my concern is not so much his excessive daytime sleepiness and nocturia least I think at both symptoms of the presence of obstructive sleep apnea he has been referred to the weight loss center.  Healthy weight and wellness program.     I ordering an attended as well as a home sleep test for him his insurance will have to decide which they cover and I would prefer an attended sleep study in form of a split-night.  The patient will likely have some trouble tolerating higher pressures of CPAP if needed because of his restricted nasal airflow.  So my other suggestion is also an ENT consult to see if he can help nasal airflow to be reestablished.    My Plan is to proceed with:  1) nasal airflow restriction, frequent sinusistis, needs ENT referral 2) HST and SPLIT ordered.SPLIT at [facility].  3) RV in 3 months.   I would like to thank Carollee Herter, Alferd Apa, DO and [place], [address] [place],  [place] [address] for allowing me to meet with and to take care of this pleasant patient.   In short, Shane Haney is presenting very likely with severe OSA    I plan to follow up either personally or through our NP within 2-4  month.  .  Electronically signed by: Larey Seat, MD 08/08/2021 9:37 AM  [facility] Board certified by The [employer] of Sleep Medicine  and Diplomate of the [employer] of Sleep Medicine. Board certified In Neurology through the [employer], Fellow of the [employer] of Neurology. Medical Director of [facility].

## 2021-08-08 NOTE — Patient Instructions (Signed)
Screening for Sleep Apnea Sleep apnea is a condition in which breathing pauses or becomes shallow during sleep. Sleep apnea screening is a test to determine if you are at risk for sleep apnea. The test includes a series of questions. It will only takes a few minutes. Your health care provider may ask you to have this test in preparation for surgery or as part of a physical exam. What are the symptoms of sleep apnea? Common symptoms of sleep apnea include: Snoring. Waking up often at night. Daytime sleepiness. Pauses in breathing. Choking or gasping during sleep. Irritability. Forgetfulness. Trouble thinking clearly. Depression. Personality changes. Most people with sleep apnea do not know that they have it. What are the advantages of sleep apnea screening? Getting screened for sleep apnea can help: Ensure your safety. It is important for your health care providers to know whether or not you have sleep apnea, especially if you are having surgery or have other long-term (chronic) health conditions. Improve your health and allow you to get a better night's rest. Restful sleep can help you: Have more energy. Lose weight. Improve high blood pressure. Improve diabetes management. Prevent stroke. Prevent car accidents. What happens during the screening? Screening usually includes being asked a list of questions about your sleep quality. Some questions you may be asked include: Do you snore? Is your sleep restless? Do you have daytime sleepiness? Has a partner or spouse told you that you stop breathing during sleep? Have you had trouble concentrating or memory loss? What is your age? What is your neck circumference? To measure your neck, keep your back straight and gently wrap the tape measure around your neck. Put the tape measure at the middle of your neck, between your chin and collarbone. What is your sex assigned at birth? Do you have or are you being treated for high blood  pressure? If your screening test is positive, you are at risk for the condition. Further testing may be needed to confirm a diagnosis of sleep apnea. Where to find more information You can find screening tools online or at your health care clinic. For more information about sleep apnea screening and healthy sleep, visit these websites: Centers for Disease Control and Prevention: www.cdc.gov American Sleep Apnea Association: www.sleepapnea.org Contact a health care provider if: You think that you may have sleep apnea. Summary Sleep apnea screening can help determine if you are at risk for sleep apnea. It is important for your health care providers to know whether or not you have sleep apnea, especially if you are having surgery or have other chronic health conditions. You may be asked to take a screening test for sleep apnea in preparation for surgery or as part of a physical exam. This information is not intended to replace advice given to you by your health care provider. Make sure you discuss any questions you have with your health care provider. Document Revised: 09/08/2020 Document Reviewed: 09/08/2020 Elsevier Patient Education  2022 Elsevier Inc.  

## 2021-09-24 ENCOUNTER — Telehealth: Payer: Self-pay | Admitting: Neurology

## 2021-09-24 NOTE — Telephone Encounter (Signed)
Returned pt's call to answer a few questions about his sleep study and LVM.

## 2021-09-30 ENCOUNTER — Ambulatory Visit (INDEPENDENT_AMBULATORY_CARE_PROVIDER_SITE_OTHER): Payer: BC Managed Care – PPO | Admitting: Neurology

## 2021-09-30 ENCOUNTER — Other Ambulatory Visit: Payer: Self-pay

## 2021-09-30 ENCOUNTER — Telehealth: Payer: Self-pay

## 2021-09-30 DIAGNOSIS — G4733 Obstructive sleep apnea (adult) (pediatric): Secondary | ICD-10-CM | POA: Diagnosis not present

## 2021-09-30 DIAGNOSIS — R0683 Snoring: Secondary | ICD-10-CM

## 2021-09-30 DIAGNOSIS — J0121 Acute recurrent ethmoidal sinusitis: Secondary | ICD-10-CM

## 2021-09-30 DIAGNOSIS — G4719 Other hypersomnia: Secondary | ICD-10-CM

## 2021-09-30 DIAGNOSIS — J3489 Other specified disorders of nose and nasal sinuses: Secondary | ICD-10-CM

## 2021-09-30 DIAGNOSIS — G4734 Idiopathic sleep related nonobstructive alveolar hypoventilation: Secondary | ICD-10-CM

## 2021-09-30 NOTE — Telephone Encounter (Signed)
The pt came in for a sleep study and he was inquiring about a referral to an ENT. In the ov notes Dr. Brett Fairy mentioned putting in a referral for him to see an ENT for possible deviated septum. I do not see that a referral was placed. Please follow up on this.

## 2021-10-01 ENCOUNTER — Other Ambulatory Visit: Payer: Self-pay | Admitting: Neurology

## 2021-10-01 DIAGNOSIS — G4719 Other hypersomnia: Secondary | ICD-10-CM

## 2021-10-01 DIAGNOSIS — G4730 Sleep apnea, unspecified: Secondary | ICD-10-CM

## 2021-10-01 DIAGNOSIS — R0683 Snoring: Secondary | ICD-10-CM

## 2021-10-01 DIAGNOSIS — J0121 Acute recurrent ethmoidal sinusitis: Secondary | ICD-10-CM

## 2021-10-01 NOTE — Telephone Encounter (Signed)
Referral has been placed for the pt as discussed in previous ov

## 2021-10-02 ENCOUNTER — Encounter: Payer: Self-pay | Admitting: Neurology

## 2021-10-02 ENCOUNTER — Telehealth: Payer: Self-pay

## 2021-10-02 DIAGNOSIS — G4734 Idiopathic sleep related nonobstructive alveolar hypoventilation: Secondary | ICD-10-CM

## 2021-10-02 DIAGNOSIS — G4730 Sleep apnea, unspecified: Secondary | ICD-10-CM

## 2021-10-02 DIAGNOSIS — G4733 Obstructive sleep apnea (adult) (pediatric): Secondary | ICD-10-CM

## 2021-10-02 DIAGNOSIS — E669 Obesity, unspecified: Secondary | ICD-10-CM

## 2021-10-02 DIAGNOSIS — G4719 Other hypersomnia: Secondary | ICD-10-CM | POA: Insufficient documentation

## 2021-10-02 DIAGNOSIS — J3489 Other specified disorders of nose and nasal sinuses: Secondary | ICD-10-CM | POA: Insufficient documentation

## 2021-10-02 DIAGNOSIS — Z6841 Body Mass Index (BMI) 40.0 and over, adult: Secondary | ICD-10-CM | POA: Insufficient documentation

## 2021-10-02 DIAGNOSIS — R0683 Snoring: Secondary | ICD-10-CM | POA: Insufficient documentation

## 2021-10-02 HISTORY — DX: Sleep apnea, unspecified: G47.30

## 2021-10-02 NOTE — Addendum Note (Signed)
Addended by: Larey Seat on: 10/02/2021 05:40 PM   Modules accepted: Orders

## 2021-10-02 NOTE — Telephone Encounter (Signed)
He didn't respond completely to CPAP, but wasn't switched to Bipap in the study.  Auto BiPAP can't solve his problem when he enters REM sleep    I will order 5-20 cm water autoset CPAP , 2 cm EPR, heated humidificationa and mask as stated.

## 2021-10-02 NOTE — Procedures (Signed)
PATIENT'S NAME:  Shane Haney, Shane Haney DOB:      June 09, 1961      MR#:    [id number]     DATE OF RECORDING: 09/30/2021 REFERRING M.D.:  Roma Schanz, DO Study Performed:  Split-Night Titration Study HISTORY:  Shane Haney was seen on 08-08-2021 , Chief concern according to patient :  Too sleepy, wife witnessed apneas, snoring. Frequent Nocturia and sleep related headaches were reported. Morbid obesity, SOB, Nocturia 2-5 times, had already Tonsillectomy and sinus surgery, cervical spine surgery/anterior fusion- 7 years ago, knee 8 years ago, deviated septum- sport accident- can't breathe through his nose. Has nocturnal diaphoresis, drooling, and chest tightness.     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  The patient endorsed the Epworth Sleepiness Scale at 11/24 points , FSS at 28/63 points.   The patient's weight 323 pounds with a height of 70 (inches), resulting in a BMI of 46.4 kg/m2. The patient's neck circumference measured 22 inches.  CURRENT MEDICATIONS: Multivitamin, Diovan, Flonase   PROCEDURE:  This is a multichannel digital polysomnogram utilizing the Somnostar 11.2 system.  Electrodes and sensors were applied and monitored per AASM Specifications.   EEG, EOG, Chin and Limb EMG, were sampled at 200 Hz.  ECG, Snore and Nasal Pressure, Thermal Airflow, Respiratory Effort, CPAP Flow and Pressure, Oximetry was sampled at 50 Hz. Digital video and audio were recorded.      BASELINE STUDY WITHOUT CPAP RESULTS:  Lights Out was at 21:46 and Lights On at 05:26.  Total recording time (TRT) was 172, with a total sleep time (TST) of 129.5 minutes.   The patient's sleep latency was 29.5 minutes.  REM latency was 0 minutes ( no REM sleep).  The sleep efficiency was 75.3 %.    SLEEP ARCHITECTURE: WASO (Wake after  sleep onset) was 1 minutes, Stage N1 was 6 minutes, Stage N2 was 119 minutes, Stage N3 was 4.5 minutes and Stage R (REM sleep) was 0 minutes.  The percentages were Stage N1 4.6%, Stage N2 91.9%, Stage N3 3.5% and Stage R (REM sleep) 0%.   RESPIRATORY ANALYSIS:  There were a total of 223 respiratory events: 128 obstructive apneas, 0 central apneas and 0 mixed apneas with a total of 128 apneas and 95 hypopneas.     The total APNEA/HYPOPNEA INDEX (AHI) was 103.3 /hour.   All 190 events in NREM with a non-REM AHI of 103.3 /hour. 353 minutes in non-supine. all non-supine AHI of 103.3 /hour.  OXYGEN SATURATION & C02:  The wake baseline 02 saturation was 92%, with the lowest being 74%. Time spent below 89% saturation equaled 123 minutes.  PERIODIC LIMB MOVEMENTS: The patient had a total of 0 Periodic Limb Movements.     Audio and video analysis did show that the patient snorted, snored and coughed a lot-  there was drooling noted.  The patient took bathroom breaks EKG was in keeping with normal sinus rhythm (NSR), sinus brady and tachycardia.   TITRATION STUDY WITH CPAP RESULTS: CPAP was initiated under a F and P Vitera FFM medium size. Beginning CPAP pressure at 5 cmH20 with heated humidity per AASM split night standards and pressure was advanced to a final 18 cmH20 because of hypopneas, apneas and desaturations.   At a PAP pressure of 18 cmH20, there was a reduction of the AHI to 13.5 /hour. REM sleep AHI was still 27.3/h  Total recording time (TRT) was 288 minutes, with a total sleep time (TST) of 223 minutes. The patient's sleep latency was 16 minutes. REM latency was 39.5 minutes.  The sleep efficiency was improved to 77.4 %.  Less snoring, less coughing, and less diaphoresis was noted.   SLEEP ARCHITECTURE: Wake after sleep was 48.5 minutes, Stage N1 13.5 minutes, Stage N2 137.5 minutes, Stage N3 0 minutes and Stage R (REM sleep) 72 minutes. The percentages were: Stage N1 6.1%, Stage N2 61.7%,  Stage N3 0% and Stage R (REM sleep) 32.3%. The sleep architecture was notable for REM recovery beginning under 8 cm CPAP.  RESPIRATORY ANALYSIS:  There were a total of 88 respiratory events: These were 88 hypopneas with a hypopnea index of 23.7 /hour.  The patient also had some respiratory event related arousals (RERAs).      The total APNEA/HYPOPNEA INDEX (AHI) was 23.7 /hour. 41 events occurred in REM sleep and 47 events in NREM. The REM AHI was 34.2 /hour versus a non-REM AHI of 18.7 /hour. REM sleep was achieved on a pressure of 8 cm/h2o (AHI was 75.8/h)  The patient spent 0% of total sleep time in the supine position. Total non-supine AHI of 23.7/hour.  OXYGEN SATURATION & C02:  The wake baseline 02 saturation was 91%, with the lowest being 73%. Time spent below 89% saturation equaled 31 minutes.  The arousals were noted as: 52 were spontaneous, 1 was associated with PLMs, and only 19 were associated with respiratory events mostly in REM sleep. The patient had a total of 59 Periodic Limb Movements. The Periodic Limb Movement (PLM) Arousal index was 0.3 /hour. All limb movements in NREM sleep.   POLYSOMNOGRAPHY IMPRESSION :   Severest Obstructive Sleep Apnea (OSA) at AHI of 103.3/h (!) - this is associated with loud snoring, with diaphoresis and bradycardia in sleep, all is related to apnea burden, including severe hypoxemia.  Titration to CPAP was incomplete and final high pressure of 18 cm water was not able to control REM related apnea/ hypopnea.  In order to easier tolerate any form of PAP therapy, I would want this patient to undergo ENT examination to improve nasal airflow and start of BIPAP.  Higher pressures are obviously needed but comfort and tolerance are likely better on BiPAP.    RECOMMENDATIONS:  The patient has some trouble tolerating higher pressures of CPAP because of his restricted nasal airflow.  ENT consult to see if he can help nasal airflow to be reestablished.  I  would want to start auto BiPAP at 4 cm water spread from 17/ 13 through 20 / 16 cm water and a  F & P Vitera medium size.  A follow up appointment will be scheduled in the Sleep Clinic at [facility].     I certify that I have reviewed the entire raw data recording prior to the issuance of this report in accordance with the Standards of Accreditation of the American Academy of Sleep Medicine (AASM)   Larey Seat, M.D.

## 2021-10-02 NOTE — Telephone Encounter (Signed)
I spoke with Meagan. We can't order an auto bipap without doing a bipap titration because it won't be covered by insurance. We can either do a bipap titration and then order auto bipap or order an auto cpap and follow up with him. If he fails auto cpap, then we can do a bipap titration.

## 2021-10-02 NOTE — Telephone Encounter (Signed)
I called patient to discuss. No answer, left a message asking him to call me back. If patient calls back any day other than today please send to POD 1.  Dr. Brett Fairy- Please see previous message about auto-bipap order.

## 2021-10-02 NOTE — Telephone Encounter (Signed)
-----   Message from Larey Seat, MD sent at 10/02/2021 12:56 PM EST ----- POLYSOMNOGRAPHY IMPRESSION:   1. Severest Obstructive Sleep Apnea (OSA) at AHI of 103.3/h (!) - this is associated with loud snoring, with diaphoresis and bradycardia in sleep, all is related to apnea burden, including severe hypoxemia.  2. Titration to CPAP was incomplete and final high pressure of 18 cm water was not able to control REM related apnea/ hypopnea. 3.  In order to easier tolerate any form of PAP therapy, I would want this patient to undergo ENT examination to improve nasal airflow and start of BIPAP.  4. Higher pressures are obviously needed but comfort and tolerance are likely better on BiPAP.    RECOMMENDATIONS:  The patient has some trouble tolerating higher pressures of CPAP because of his restricted nasal airflow.  ENT consult to see if he can help nasal airflow to be reestablished.  I would want to start auto BiPAP at 4 cm water spread from 17/ 13 through 20 / 16 cm water and a  F & P Vitera medium size.  A follow up appointment will be scheduled in the Sleep Clinic at [facility].

## 2021-10-02 NOTE — Progress Notes (Signed)
POLYSOMNOGRAPHY IMPRESSION:   1. Severest Obstructive Sleep Apnea (OSA) at AHI of 103.3/h (!) - this is associated with loud snoring, with diaphoresis and bradycardia in sleep, all is related to apnea burden, including severe hypoxemia.  2. Titration to CPAP was incomplete and final high pressure of 18 cm water was not able to control REM related apnea/ hypopnea. 3.  In order to easier tolerate any form of PAP therapy, I would want this patient to undergo ENT examination to improve nasal airflow and start of BIPAP.  4. Higher pressures are obviously needed but comfort and tolerance are likely better on BiPAP.    RECOMMENDATIONS:  The patient has some trouble tolerating higher pressures of CPAP because of his restricted nasal airflow.  ENT consult to see if he can help nasal airflow to be reestablished.  I would want to start auto BiPAP at 4 cm water spread from 17/ 13 through 20 / 16 cm water and a  F & P Vitera medium size.  A follow up appointment will be scheduled in the Sleep Clinic at [facility].

## 2021-10-02 NOTE — Addendum Note (Signed)
Addended by: Larey Seat on: 10/02/2021 12:56 PM   Modules accepted: Orders

## 2021-10-02 NOTE — Telephone Encounter (Deleted)
ENT referral has been sent to [facility]- [facility] in [place]. Phone: [phone].

## 2021-10-03 ENCOUNTER — Encounter: Payer: Self-pay | Admitting: *Deleted

## 2021-10-03 NOTE — Telephone Encounter (Signed)
Called pt back. Relayed results of sleep study. Per Dr. Brett Fairy: "He didn't respond completely to CPAP, but wasn't switched to Bipap in the study. Auto BiPAP can't solve his problem when he enters REM sleep. I will order auto cpap 5-20 cm water autoset CPAP , 2 cm EPR, heated humidificationa and mask as stated."  I reviewed PAP compliance expectations with the pt. Pt is agreeable to starting a CPAP. I advised pt that an order will be sent to a DME, [employer], and [employer] will call the pt within about one week after they file with the pt's insurance. [employer] will show the pt how to use the machine, fit for masks, and troubleshoot the CPAP if needed. A follow up appt was made for insurance purposes with Dr. Brett Fairy on 01/09/22 at 8:30am. Pt verbalized understanding to arrive 15 minutes early and bring their CPAP. A letter with all of this information in it will be mailed to the pt as a reminder. I verified with the pt that the address we have on file is correct. Pt verbalized understanding of results. Pt had no questions at this time but was encouraged to call back if questions arise. I have sent the order to [employer] and have received confirmation that they have received the order.

## 2021-10-09 ENCOUNTER — Telehealth: Payer: Self-pay | Admitting: Neurology

## 2021-10-09 NOTE — Telephone Encounter (Signed)
Pt was scheduled for his initial CPAP visit on 11-14-21 Pt was informed to bring machine and power cord to the appointment. DME: [place] [phone] option 1 [phone] Equipment Issued:[employer] 11 on 10-04-2021 A follow up clinical evaluation must occur between 11-04-2021 and 01-02-2022

## 2021-10-14 HISTORY — PX: MICRODISCECTOMY LUMBAR: SUR864

## 2021-11-14 ENCOUNTER — Ambulatory Visit: Payer: BC Managed Care – PPO | Admitting: Adult Health

## 2021-11-14 ENCOUNTER — Encounter: Payer: Self-pay | Admitting: Adult Health

## 2021-11-14 VITALS — BP 143/94 | HR 67 | Ht 70.0 in | Wt 326.0 lb

## 2021-11-14 DIAGNOSIS — R600 Localized edema: Secondary | ICD-10-CM | POA: Diagnosis not present

## 2021-11-14 DIAGNOSIS — Z9989 Dependence on other enabling machines and devices: Secondary | ICD-10-CM

## 2021-11-14 DIAGNOSIS — G4733 Obstructive sleep apnea (adult) (pediatric): Secondary | ICD-10-CM | POA: Diagnosis not present

## 2021-11-14 NOTE — Patient Instructions (Signed)
Your Plan:  Continue nightly use of CPAP - continue to follow with DME company for any needed supplies or CPAP related concerns     Follow-up in 6 months or call earlier if needed     Thank you for coming to see [place] at [facility]. I hope we have been able to provide you high quality care today.  You may receive a patient satisfaction survey over the next few weeks. We would appreciate your feedback and comments so that we may continue to improve ourselves and the health of our patients.

## 2021-11-14 NOTE — Progress Notes (Signed)
[facility] [address] [place]. [place] [address] [phone]       OFFICE FOLLOW UP NOTE  [name] Date of Birth:  [birth year] Medical Record Number:  [id number]   Reason for visit: Initial CPAP follow-up    SUBJECTIVE:   CHIEF COMPLAINT:  Chief Complaint  Patient presents with   Obstructive Sleep Apnea    RM  3 alone Pt is well, tolerating CPAP ok but having problems with mask.     HPI:   Update 11/14/2021 JM: Patient returns for initial CPAP follow-up.  Consult visit with Dr. Brett Fairy 08/08/2021 for complaints of daytime fatigue, witnessed apnea, snoring, nocturia and headaches.  Completed split-night study 09/30/2021 which showed total AHI of 103.3/hr. per Dr. Brett Fairy " he did not respond completely to CPAP, but was not switched to BiPAP in the study.  Although BiPAP cannot solve his problem when he enters REM sleep". AutoPAP and referred to ENT to improve nasal airflow.  Received CPAP machine 10/04/2021. Eval by ENT 10/12/2021 with concern of nasal obstruction and recommended bilateral inferior turbinate submucosal resection which was performed on 11/02/2021.   CPAP compliance report from 10/15/2021 -11/13/2021 shows 29 out of 30 usage days with 29 days greater than 4 hours for 97% compliance.  Average usage 7 hours and 48 minutes.  Residual AHI 2.9.  Pressure in the 95th percentile 17.9 with pressure settings 5-20 and EPR level 2.  Leaks in the 95th percentile 33.1.  Reports doing well on CPAP. He notes a great improvement of energy level and sleeping better. Mentions some difficulty with his mask as it will slide up on his face and will have to frequently readjust. Currently using full facemask. Otherwise, no CPAP concerns. He follows with [facility].  Epworth Sleepiness Scale 2/24 (prior 11/24).  Fatigue severity scale 10/63 (prior 28/63). He plans on starting to focus on weight loss and he notes weight gain since COVID.   He does mention  approx 2 weeks ago, he attempted to donate plasma which he has been routinely in the past. The nurse noted BLE edema and unable to donate. He has not yet had f/u with PCP regarding this. He questions if he truly does have swelling. Hx of left knee replacement but otherwise no BLE procedures. Denies BLE pain, prior injury, prior dx of venous insufficiency. No warmth or redness noted.        History provided for reference purposes only Consult visit 08/08/2021 Dr. Brett Fairy: Shane Haney is a 61 y.o. year old White or Caucasian male patient seen here as a referral on 08/08/2021 from PCP for a Sleep consultation. .  Chief concern according to patient :  Too sleepy, wife witnessed apneas, snoring. Nocturia and headaches reported.    I have the pleasure of seeing Shane Haney today, a right-handed  Caucasian male with a possible sleep disorder.  She has a  has a past medical history of Diverticulosis, cardia -Dysrhythmia, Gastroenteritis (08/20/2017), H/O echocardiogram (2010), Hyperlipidemia, Hypertension, Osteoarthritis, and Snores.   Sleep relevant medical history: Morbid obesity, SOB, Nocturia 2-5 , had Tonsillectomy and sinus surgery, cervical spine surgery/anterior fusion , 7 years ago, knee 8 years ago,  deviated septum- sport accident- cant breathe through his nose.     Family medical /sleep history:  Brothers on CPAP with OSA, father may have had it.    Social history:  Patient is working as Careers information officer, and health, coach- and lives in a household with 4 persons/  alone. Family status is married , with 2 children, at home, 2 dogs , 2 cats.  The patient currently works. Tobacco use; none .  ETOH use ; vodka, weekends,  Caffeine intake in form of Coffee( 2 cups in AM ) Soda(  5-6 days a week-) Tea ( /) or energy drinks. Regular exercise daily..     Sleep habits are as follows: The patient's dinner time is between 6-7  PM. The patient goes to bed at 11 PM and continues to sleep for  intervals of 2  hours.   The preferred sleep position is right sided, with the support of 2 pillows.  Dreams are reportedly frequent.  7  AM is the usual rise time. The patient wakes up spontaneously 6.30-45  He reports not feeling refreshed or restored in AM, with symptoms such as dry mouth, morning headaches, and residual fatigue.  The patient reports being able to sleep whenever not physically active or mentally stimulated.  If he is relaxed and at rest and especially after a meal he is likely to fall asleep.   Naps are taken frequently, lasting from 30- to 60 minutes and are more refreshing than nocturnal sleep.         ROS:   14 system review of systems performed and negative with exception of those listed in HPI  PMH:  Past Medical History:  Diagnosis Date   Diverticulosis    Dysrhythmia    "irregular at one time" (07/21/2013)   Gastroenteritis 08/20/2017   H/O echocardiogram 2010   seen by Dr. Stanford Breed- told to return as needed    Hyperlipidemia    Hypertension    Dr, Stanford Breed told pt. in 2010, ? extra beats.   Neuromuscular disorder ([place])    spinal cord compression, carpal tunnel - R hand  . Left 3 fingers remains with some tingling.   Osteoarthritis    "knees and wrists" (07/21/2013)   Sleep apnea 10/02/2021   Snores     PSH:  Past Surgical History:  Procedure Laterality Date   ANTERIOR CERVICAL DECOMP/DISCECTOMY FUSION N/A 07/08/2013   Procedure: ANTERIOR CERVICAL DECOMPRESSION/DISCECTOMY FUSION 2 LEVELS C3-C5;  Surgeon: Melina Schools, MD;  Location: [facility];  Service: Orthopedics;  Laterality: N/A;   ANTERIOR CRUCIATE LIGAMENT REPAIR Left 1999   CARPAL TUNNEL RELEASE Left 05/2013   CARPAL TUNNEL RELEASE Right 07/08/2013   Procedure: LIMITED OPEN RIGHT CARPAL TUNNEL RELEASE;  Surgeon: Roseanne Kaufman, MD;  Location: [facility];  Service: Orthopedics;  Laterality: Right;   INCISION AND DRAINAGE OF WOUND N/A 07/21/2013   Procedure: IRRIGATION AND DEBRIDEMENT OF CERVICAL  WOUND;  Surgeon: Melina Schools, MD;  Location: [facility];  Service: Orthopedics;  Laterality: N/A;   KNEE ARTHROSCOPY Right 2007   OPEN REDUCTION INTERNAL FIXATION (ORIF) FOOT LISFRANC FRACTURE Right 1993   R- /w ORIF   TONSILLECTOMY     TOTAL KNEE ARTHROPLASTY Left 04/11/2014   Procedure: LEFT TOTAL KNEE ARTHROPLASTY WITH HARDWARE REMOVAL;  Surgeon: [name], MD;  Location: [facility];  Service: Orthopedics;  Laterality: Left;    Social History:  Social History   Socioeconomic History   Marital status: Married    Spouse name: Billi   Number of children: 2   Years of education: Not on file   Highest education level: Bachelor's degree (e.g., BA, AB, BS)  Occupational History   Occupation: Pharmacist, hospital--- [employer]    Employer: [employer]  Tobacco Use   Smoking status: Never   Smokeless tobacco: Never  Substance and Sexual Activity   Alcohol use: Yes    Alcohol/week: 4.0 standard drinks    Types: 2 Cans of beer, 2 Shots of liquor per week    Comment: 04-05-14 "3-5 mixed drinks or 6 beers on the weekends   Drug use: No   Sexual activity: Yes    Partners: Female  Other Topics Concern   Not on file  Social History Narrative   Lives with wife and 2 children   Ambidextrous, eats and writes r handed. Throws and kicks w left hand/foot   Caffeine: 2 cups of coffee a day, diet mtn dew before workout      Social Determinants of Health   Financial Resource Strain: Not on file  Food Insecurity: Not on file  Transportation Needs: Not on file  Physical Activity: Not on file  Stress: Not on file  Social Connections: Not on file  Intimate Partner Violence: Not on file    Family History:  Family History  Problem Relation Age of Onset   Stroke Father    Hypertension Father    Alcohol abuse Father    Hypertension Mother    Aneurysm Mother        Brain   HIV Brother        passed away from AIDS   Colon cancer Neg Hx    Colon polyps Neg Hx     Medications:   Current  Outpatient Medications on File Prior to Visit  Medication Sig Dispense Refill   Multiple Vitamin (MULTIVITAMIN WITH MINERALS) TABS tablet Take 1 tablet by mouth daily.     Oxymetazoline HCl (NASAL SPRAY) 0.05 % SOLN Place into the nose.     valsartan (DIOVAN) 320 MG tablet TAKE 1 TABLET BY MOUTH DAILY 90 tablet 1   No current facility-administered medications on file prior to visit.    Allergies:  No Known Allergies    OBJECTIVE:  Physical Exam  Vitals:   11/14/21 0803  BP: (!) 143/94  Pulse: 67  Weight: (!) 326 lb (147.9 kg)  Height: 5\' 10"  (1.778 m)   Body mass index is 46.78 kg/m. No results found.  General: Morbidly obese very pleasant middle-age Caucasian male, seated, in no evident distress Head: head normocephalic and atraumatic.   Neck: supple with no carotid or supraclavicular bruits.  Circumference 21" Cardiovascular: regular rate and rhythm, no murmurs Musculoskeletal: no deformity Skin:  no rash/petichiae; trace edema BLE Vascular:  Normal pulses all extremities including pedal pulses   Neurologic Exam Mental Status: Awake and fully alert. Oriented to place and time. Recent and remote memory intact. Attention span, concentration and fund of knowledge appropriate. Mood and affect appropriate.  Cranial Nerves: Pupils equal, briskly reactive to light. Extraocular movements full without nystagmus. Visual fields full to confrontation. Hearing intact. Facial sensation intact. Face, tongue, palate moves normally and symmetrically.  Motor: Normal bulk and tone. Normal strength in all tested extremity muscles Sensory.: intact to touch , pinprick , position and vibratory sensation.  Coordination: Rapid alternating movements normal in all extremities. Finger-to-nose and heel-to-shin performed accurately bilaterally. Gait and Station: Arises from chair without difficulty. Stance is normal. Gait demonstrates normal stride length and balance without use of AD. Tandem walk and  heel toe without difficulty.  Reflexes: 1+ and symmetric. Toes downgoing.         ASSESSMENT/PLAN: Shane Haney is a 61 y.o. year old male with diagnosis of severe sleep apnea with total AHI 103.3/h per split-night study 09/30/2021. Diagnosed with  inferior turbinate hypertrophy s/p bilateral inferior turbinate submucosal resection 11/02/2021. Also notes BLE edema     Severe OSA: greatly improved and well controlled on CPAP, excellent compliance with optimal residual AHI 2.9. order will be placed to DME company for mask refitting due to concerns of leaks and mask sliding. Advised any further CPAP related concerns to follow-up with DME company.  Continue nightly CPAP compliance BLE edema: unsure chronic as patient unaware of edema. Besides trace edema, no other concerning findings or red flag symptoms. Advised to f/u with PCP for further evaluation     Follow up in 6 months or call earlier if needed   CC:  PCP: Carollee Herter, Alferd Apa, DO    I spent 34 minutes of face-to-face and non-face-to-face time with patient.  This included previsit chart review, lab review, study review, order entry, electronic health record documentation, patient education regarding recent diagnosis of severe sleep apnea, review and discussion of compliance report, importance of nightly CPAP usage, and answered all the questions as noted above to patient satisfaction   Frann Rider, AGNP-BC  [facility] [address] [place], [place] [address]  Phone [phone] Fax [phone] Note: This document was prepared with digital dictation and possible smart phrase technology. Any transcriptional errors that result from this process are unintentional.

## 2022-01-09 ENCOUNTER — Ambulatory Visit: Payer: BC Managed Care – PPO | Admitting: Neurology

## 2022-01-11 ENCOUNTER — Telehealth: Payer: Self-pay | Admitting: Family Medicine

## 2022-01-11 NOTE — Telephone Encounter (Signed)
Received

## 2022-01-11 NOTE — Telephone Encounter (Signed)
Pt dropped off document to be filled out by provider ([place] docu- 1 page) PT would like to be called when document ready to pick up at [facility] [phone]. Document put at front office tray under providers name.  ?

## 2022-01-21 NOTE — Telephone Encounter (Signed)
Pt called for update. Advised patient that the documentation had been received but not filled out yet and that we would let him know when it is completed.  ?

## 2022-01-21 NOTE — Telephone Encounter (Signed)
Pt called. LVM advising the form was ready for pick up. Placed at the front ?

## 2022-04-05 ENCOUNTER — Telehealth (INDEPENDENT_AMBULATORY_CARE_PROVIDER_SITE_OTHER): Payer: BC Managed Care – PPO | Admitting: Family

## 2022-04-05 VITALS — Temp 102.6°F

## 2022-04-05 DIAGNOSIS — R519 Headache, unspecified: Secondary | ICD-10-CM | POA: Diagnosis not present

## 2022-04-05 DIAGNOSIS — R509 Fever, unspecified: Secondary | ICD-10-CM | POA: Diagnosis not present

## 2022-04-05 DIAGNOSIS — B349 Viral infection, unspecified: Secondary | ICD-10-CM | POA: Diagnosis not present

## 2022-04-05 NOTE — Progress Notes (Signed)
Virtual Visit via Video   I connected with patient on 04/05/22 at 10:45 AM EDT by a video enabled telemedicine application and verified that I am speaking with the correct person using two identifiers.  Location patient: Home Location provider: Administrator, Office Persons participating in the virtual visit: Patient, Provider, CMA I discussed the limitations of evaluation and management by telemedicine and the availability of in person appointments. The patient expressed understanding and agreed to proceed.  Subjective:   HPI:   61 year old male presents today via video with concerns of fever, chills, headache that began 04/03/22. He has taken a home COVID test that was negative. Admits to traveling to [place] via car and being around someone who may have been sick.  Has been taking Ibuprofen, Tylenol and Theraflu that helps. Reports being prone to sinus infections. Denies cough, congestion, or sinus pressure. Temps have been between 99-103 and responsive to medication. No watery stool or burning with urination.   ROS:   See pertinent positives and negatives per HPI.  Patient Active Problem List   Diagnosis Date Noted   Sleep apnea 10/02/2021   Loud snoring 10/02/2021   Excessive daytime sleepiness 10/02/2021   Morbid obesity with body mass index (BMI) of 45.0 to 49.9 in adult [facility]) 10/02/2021   Chronic intermittent hypoxia with obstructive sleep apnea 10/02/2021   Severe obstructive sleep apnea-hypopnea syndrome 10/02/2021   Nasal obstruction 10/02/2021   Allergies 06/22/2021   Snoring 05/29/2021   Morbid obesity (HCC) 05/29/2021   Conjunctivitis 08/20/2017   Gastroenteritis 08/20/2017   Onychomycosis 04/08/2017   Acute bacterial sinusitis 12/05/2015   Colon cancer screening 12/05/2014   S/P left TKA 04/11/2014   Obesity (BMI 30-39.9) 06/05/2013   Spinal stenosis in cervical region 06/05/2013   CTS (carpal tunnel syndrome) 06/05/2013   Myalgia and myositis  06/05/2013   Left knee pain 04/19/2013   DEGENERATIVE JOINT DISEASE 10/02/2009   Hyperlipidemia 01/24/2009   SINUSITIS- ACUTE-NOS 10/28/2007   DIVERTICULOSIS, COLON 08/28/2007   Primary hypertension 08/26/2007   HEMORRHOIDS, INTERNAL 08/26/2007   PARESTHESIA, HANDS 08/26/2007    Social History   Tobacco Use   Smoking status: Never   Smokeless tobacco: Never  Substance Use Topics   Alcohol use: Yes    Alcohol/week: 4.0 standard drinks of alcohol    Types: 2 Cans of beer, 2 Shots of liquor per week    Comment: 04-05-14 "3-5 mixed drinks or 6 beers on the weekends    Current Outpatient Medications:    Multiple Vitamin (MULTIVITAMIN WITH MINERALS) TABS tablet, Take 1 tablet by mouth daily., Disp: , Rfl:    Oxymetazoline HCl (NASAL SPRAY) 0.05 % SOLN, Place into the nose., Disp: , Rfl:    valsartan (DIOVAN) 320 MG tablet, TAKE 1 TABLET BY MOUTH DAILY, Disp: 90 tablet, Rfl: 1  No Known Allergies  Objective:   Temp (!) 102.6 F (39.2 C) (Oral)   Patient is well-developed, well-nourished in no acute distress.  Resting comfortably at home.  Head is normocephalic, atraumatic.  No labored breathing.  Speech is clear and coherent with logical content.  Patient is alert and oriented at baseline.    Assessment and Plan:    Diagnoses and all orders for this visit:  Viral illness  Fever and chills  Headache, unspecified headache type   Continue Ibuprofen, Tylenol alternating. Theraflu as needed. Rest, drink plenty of fluids. Call with any worsening symptoms or concerns.    Eulis Foster, FNP 04/05/2022  Time spent  with the patient: 20 minutes, of which >50% was spent in obtaining information about symptoms, reviewing previous labs, evaluations, and treatments, counseling about condition (please see the discussed topics above), and developing a plan to further investigate it; had a number of questions which I addressed.

## 2022-04-09 ENCOUNTER — Ambulatory Visit: Payer: BC Managed Care – PPO | Admitting: Family Medicine

## 2022-04-09 ENCOUNTER — Telehealth: Payer: Self-pay | Admitting: Family Medicine

## 2022-04-09 DIAGNOSIS — I10 Essential (primary) hypertension: Secondary | ICD-10-CM

## 2022-04-09 NOTE — Telephone Encounter (Signed)
Pt called to follow up on denial of valsartan. Advised pt that no information was visible as to the reason for the denial. Pt asked if a note could be sent back to see what the reason was and for him to be called back when that info was available.

## 2022-04-10 ENCOUNTER — Other Ambulatory Visit: Payer: Self-pay

## 2022-04-10 DIAGNOSIS — I10 Essential (primary) hypertension: Secondary | ICD-10-CM

## 2022-04-10 MED ORDER — VALSARTAN 320 MG PO TABS: ORAL_TABLET | ORAL | 1 refills | Status: DC

## 2022-04-10 NOTE — Telephone Encounter (Signed)
Refill sent.

## 2022-04-17 ENCOUNTER — Telehealth: Payer: Self-pay

## 2022-04-17 NOTE — Telephone Encounter (Signed)
Transition Care Management Follow-up Telephone Call Date of discharge and from where:04/15/22 from [facility] How have you been since you were released from the hospital? "I am feeling a lot better." States he read over discharge instructions when he got home and has been following. Any questions or concerns? No  Items Reviewed: Did the pt receive and understand the discharge instructions provided? Yes  Medications obtained and verified? Yes  Other? No  Any new allergies since your discharge? No  Dietary orders reviewed? No  Do you have support at home? Yes   Home Care and Equipment/Supplies: Were home health services ordered? no If so, what is the name of the agency? N/a  Has the agency set up a time to come to the patient's home? not applicable Were any new equipment or medical supplies ordered?  No, stated did not need any new supplies. What is the name of the medical supply agency? N/a Were you able to get the supplies/equipment? not applicable Do you have any questions related to the use of the equipment or supplies? No  Functional Questionnaire: (I = Independent and D = Dependent) ADLs: I  Bathing/Dressing- I  Meal Prep- I  Eating- I  Maintaining continence- I  Transferring/Ambulation- I  Managing Meds- I  Follow up appointments reviewed:  PCP Hospital f/u appt confirmed?  Following up with specialist Infectious disease.   [facility] f/u appt confirmed?  States per instructions he is to call Infectious disease today to call to schedule follow up appointment for next week. Reports he just completed IV antibiotic treatment for today.   Are transportation arrangements needed? No  If their condition worsens, is the pt aware to call PCP or go to the Emergency Dept.? Yes Was the patient provided with contact information for the PCP's office or ED? Yes Was to pt encouraged to call back with questions or concerns? Yes  Thea Silversmith, RN,  MSN, BSN, [place] Care Management Coordinator [phone]

## 2022-05-09 ENCOUNTER — Ambulatory Visit (HOSPITAL_BASED_OUTPATIENT_CLINIC_OR_DEPARTMENT_OTHER)
Admission: RE | Admit: 2022-05-09 | Discharge: 2022-05-09 | Disposition: A | Discharge status: Home / Self Care | Payer: BC Managed Care – PPO | Source: Ambulatory Visit | Attending: Family Medicine | Admitting: Family Medicine

## 2022-05-09 ENCOUNTER — Ambulatory Visit: Payer: BC Managed Care – PPO | Admitting: Family Medicine

## 2022-05-09 ENCOUNTER — Encounter: Payer: Self-pay | Admitting: Family Medicine

## 2022-05-09 VITALS — BP 140/82 | HR 69 | Temp 98.3°F | Resp 20 | Ht 70.0 in | Wt 303.6 lb

## 2022-05-09 DIAGNOSIS — L03011 Cellulitis of right finger: Secondary | ICD-10-CM

## 2022-05-09 DIAGNOSIS — M79644 Pain in right finger(s): Secondary | ICD-10-CM | POA: Diagnosis present

## 2022-05-09 DIAGNOSIS — G8929 Other chronic pain: Secondary | ICD-10-CM

## 2022-05-09 DIAGNOSIS — M5442 Lumbago with sciatica, left side: Secondary | ICD-10-CM | POA: Diagnosis not present

## 2022-05-09 DIAGNOSIS — B079 Viral wart, unspecified: Secondary | ICD-10-CM

## 2022-05-09 MED ORDER — CEPHALEXIN 500 MG PO CAPS: 500.0000 mg | ORAL_CAPSULE | Freq: Two times a day (BID) | ORAL | 0 refills | Status: DC

## 2022-05-09 NOTE — Patient Instructions (Signed)
Warts  Warts are small growths on the skin. They are common and can occur on many areas of the body. A person may have one wart or several warts. In many cases, warts do not require treatment. They usually go away on their own over a period of many months to a few years. If needed, warts that cause problems or do not go away on their own can be treated. What are the causes? Warts are caused by a type of virus that is called human papillomavirus (HPV). This virus can spread from person to person through direct contact. Warts can also spread to other areas of the body when a person scratches a wart and then scratches another area of his or her body. What increases the risk? You are more likely to develop this condition if: You are 54-19 years old. You have a weakened body defense system (immune system). You are Caucasian. What are the signs or symptoms? The main symptom of this condition is small growths on the skin. Warts may: Be round or oval or have an irregular shape. Have a rough surface. Range in color from skin color to light yellow, brown, or gray. Generally be less than  inch (1.3 cm) in size. Go away and then come back again. Most warts are painless, but some can be painful if they are large or occur in an area of the body where pressure will be applied to them, such as the bottom of the foot. How is this diagnosed? A wart can usually be diagnosed based on its appearance. In some cases, a tissue sample may be removed (biopsy) to be looked at under a microscope. How is this treated? In many cases, warts do not need treatment. Sometimes treatment is desired. If treatment is needed or desired, options may include: Applying medicated solutions, creams, or patches to the wart. These may be over-the-counter or prescription medicines that make the skin soft so that layers will gradually shed away. In many cases, the medicine is applied one or two times per day and covered with a  bandage. Putting duct tape over the top of the wart (occlusion). You will leave the tape in place for as long as told by your health care provider and then replace it with a new strip of tape. This is done until the wart goes away. Freezing the wart with liquid nitrogen (cryotherapy). Burning the wart with: Laser treatment. An electrified probe (electrocautery). Injection of a medicine (Candida antigen) into the wart to help the body's immune system fight off the wart. Surgery to remove the wart. Follow these instructions at home: Medicines Apply over-the-counter and prescription medicines only as told by your health care provider. Do not apply over-the-counter wart medicines to your face or genitals unless your health care provider tells you to do that. Lifestyle Keep your immune system healthy. To do this: Eat a healthy, balanced diet. Get enough sleep. Do not use any products that contain nicotine or tobacco, such as cigarettes and e-cigarettes. If you need help quitting, ask your health care provider. General instructions  Wash your hands after you touch a wart. Do not scratch or pick at a wart. Avoid shaving hair that is over a wart. Keep all follow-up visits as told by your health care provider. This is important. Contact a health care provider if: Your warts do not improve after treatment. You have redness, swelling, or pain at the site of a wart. You have bleeding from a wart that does not  stop with light pressure. You have diabetes and you develop a wart. Summary Warts are small growths on the skin. They are common and can occur on many areas of the body. In many cases, warts do not need treatment. Sometimes treatment is desired. If treatment is needed or desired, there are several treatment options. Apply over-the-counter and prescription medicines only as told by your health care provider. Wash your hands after you touch a wart. Keep all follow-up visits as told by your  health care provider. This is important. This information is not intended to replace advice given to you by your health care provider. Make sure you discuss any questions you have with your health care provider. Document Revised: 07/16/2021 Document Reviewed: 07/16/2021 Elsevier Patient Education  [place].

## 2022-05-09 NOTE — Assessment & Plan Note (Signed)
abx per orders  Xray today rto prn

## 2022-05-09 NOTE — Assessment & Plan Note (Signed)
Cryo -----x3 cycles  abx per orders Xray

## 2022-05-09 NOTE — Progress Notes (Signed)
Subjective:   By signing my name below, I, Luiz Ochoa, attest that this documentation has been prepared under the direction and in the presence of Ann Held, DO 05/09/2022     Patient ID: Shane Haney, male    DOB: 1960/11/24, 61 y.o.   MRN: [id number]  Chief Complaint  Patient presents with   Warts    Right pointer finger, x 3 weeks ago. Pt states having throbbing pain and having some mobility issues.. Some yellow discharge.     HPI Patient is in today for follow up visit.  He was recently in the hospital prior to the visit.  In June, he had a cut on his posterior side of his upper calf, and he later developed a fever and swelling in left lower extremity shortly after. His symptom shave resolved as of this time.  He has a wart on his right pointer finger, that developed over 3 weeks ago. When he squeezed it, he reports that pus/whitish liquid came out of it. He tried to "freeze" it with "compound W" this morning, and also tried to treat the bump with a duck tape. He believes he might have jammed/fractured the same finger (with the wart/bump) while at the pool, but adds that it did not give him pain until he went to sleep that night.  He is taking pain medication for his back, which has improved. He also states that he has been losing weight.  Past Medical History:  Diagnosis Date   Diverticulosis    Dysrhythmia    "irregular at one time" (07/21/2013)   Gastroenteritis 08/20/2017   H/O echocardiogram 2010   seen by Dr. Stanford Breed- told to return as needed    Hyperlipidemia    Hypertension    Dr, Stanford Breed told pt. in 2010, ? extra beats.   Neuromuscular disorder ([place])    spinal cord compression, carpal tunnel - R hand  . Left 3 fingers remains with some tingling.   Osteoarthritis    "knees and wrists" (07/21/2013)   Sleep apnea 10/02/2021   Snores     Past Surgical History:  Procedure Laterality Date   ANTERIOR CERVICAL DECOMP/DISCECTOMY FUSION N/A  07/08/2013   Procedure: ANTERIOR CERVICAL DECOMPRESSION/DISCECTOMY FUSION 2 LEVELS C3-C5;  Surgeon: Melina Schools, MD;  Location: [facility];  Service: Orthopedics;  Laterality: N/A;   ANTERIOR CRUCIATE LIGAMENT REPAIR Left 1999   CARPAL TUNNEL RELEASE Left 05/2013   CARPAL TUNNEL RELEASE Right 07/08/2013   Procedure: LIMITED OPEN RIGHT CARPAL TUNNEL RELEASE;  Surgeon: Roseanne Kaufman, MD;  Location: [facility];  Service: Orthopedics;  Laterality: Right;   INCISION AND DRAINAGE OF WOUND N/A 07/21/2013   Procedure: IRRIGATION AND DEBRIDEMENT OF CERVICAL WOUND;  Surgeon: Melina Schools, MD;  Location: [facility];  Service: Orthopedics;  Laterality: N/A;   KNEE ARTHROSCOPY Right 2007   OPEN REDUCTION INTERNAL FIXATION (ORIF) FOOT LISFRANC FRACTURE Right 1993   R- /w ORIF   TONSILLECTOMY     TOTAL KNEE ARTHROPLASTY Left 04/11/2014   Procedure: LEFT TOTAL KNEE ARTHROPLASTY WITH HARDWARE REMOVAL;  Surgeon: [name], MD;  Location: [facility];  Service: Orthopedics;  Laterality: Left;    Family History  Problem Relation Age of Onset   Stroke Father    Hypertension Father    Alcohol abuse Father    Hypertension Mother    Aneurysm Mother        Brain   HIV Brother        passed away from [place]  Colon cancer Neg Hx    Colon polyps Neg Hx     Social History   Socioeconomic History   Marital status: Married    Spouse name: Billi   Number of children: 2   Years of education: Not on file   Highest education level: Bachelor's degree (e.g., BA, AB, BS)  Occupational History   Occupation: Pharmacist, hospital--- [employer]    Employer: [employer]  Tobacco Use   Smoking status: Never   Smokeless tobacco: Never  Substance and Sexual Activity   Alcohol use: Yes    Alcohol/week: 4.0 standard drinks of alcohol    Types: 2 Cans of beer, 2 Shots of liquor per week    Comment: 04-05-14 "3-5 mixed drinks or 6 beers on the weekends   Drug use: No   Sexual activity: Yes    Partners: Female  Other Topics Concern   Not on  file  Social History Narrative   Lives with wife and 2 children   Ambidextrous, eats and writes r handed. Throws and kicks w left hand/foot   Caffeine: 2 cups of coffee a day, diet mtn dew before workout      Social Determinants of Health   Financial Resource Strain: Not on file  Food Insecurity: Not on file  Transportation Needs: Not on file  Physical Activity: Not on file  Stress: Not on file  Social Connections: Not on file  Intimate Partner Violence: Not on file    Outpatient Medications Prior to Visit  Medication Sig Dispense Refill   diclofenac (VOLTAREN) 75 MG EC tablet Take 1 tablet (75 mg total) by mouth 2 (two) times daily. 30 tablet 0   Multiple Vitamin (MULTIVITAMIN WITH MINERALS) TABS tablet Take 1 tablet by mouth daily.     Oxymetazoline HCl (NASAL SPRAY) 0.05 % SOLN Place into the nose.     valsartan (DIOVAN) 320 MG tablet TAKE 1 TABLET BY MOUTH DAILY 90 tablet 1   No facility-administered medications prior to visit.    No Known Allergies  Review of Systems  Constitutional:  Negative for fever and malaise/fatigue.       (+) Bump/wart on his finger of his right hand  HENT:  Negative for congestion.   Eyes:  Negative for blurred vision.  Respiratory:  Negative for cough and shortness of breath.   Cardiovascular:  Negative for chest pain, palpitations and leg swelling.  Gastrointestinal:  Negative for vomiting.  Musculoskeletal:  Positive for back pain and joint pain.  Skin:  Negative for rash.  Neurological:  Negative for loss of consciousness and headaches.       Objective:    Physical Exam Vitals and nursing note reviewed.  Constitutional:      Appearance: Normal appearance. He is not ill-appearing.  HENT:     Head: Normocephalic and atraumatic.     Right Ear: External ear normal.     Left Ear: External ear normal.  Eyes:     Extraocular Movements: Extraocular movements intact.     Pupils: Pupils are equal, round, and reactive to light.   Cardiovascular:     Rate and Rhythm: Normal rate and regular rhythm.     Pulses: Normal pulses.     Heart sounds: Normal heart sounds. No murmur heard.    No gallop.  Pulmonary:     Effort: Pulmonary effort is normal. No respiratory distress.     Breath sounds: No wheezing or rales.  Skin:    General: Skin is warm and dry.  Findings: Lesion present.     Comments: R index finger --- + wart,  with surrounding errythema and tenderness to palpation No d/c  Not hot to touch   Neurological:     Mental Status: He is alert and oriented to person, place, and time.  Psychiatric:        Judgment: Judgment normal.     BP 140/82 (BP Location: Right Arm, Patient Position: Sitting, Cuff Size: Large)   Pulse 69   Temp 98.3 F (36.8 C) (Oral)   Resp 20   Ht '5\' 10"'$  (1.778 m)   Wt (!) 303 lb 9.6 oz (137.7 kg)   SpO2 97%   BMI 43.56 kg/m  Wt Readings from Last 3 Encounters:  05/09/22 (!) 303 lb 9.6 oz (137.7 kg)  11/14/21 (!) 326 lb (147.9 kg)  08/08/21 (!) 323 lb (146.5 kg)    Diabetic Foot Exam - Simple   No data filed    Lab Results  Component Value Date   WBC 6.2 06/22/2021   HGB 15.5 06/22/2021   HCT 45.5 06/22/2021   PLT 228.0 06/22/2021   GLUCOSE 95 06/22/2021   CHOL 185 06/22/2021   TRIG 89.0 06/22/2021   HDL 37.00 (L) 06/22/2021   LDLDIRECT 159.5 12/09/2011   LDLCALC 131 (H) 06/22/2021   ALT 19 06/22/2021   AST 18 06/22/2021   NA 140 06/22/2021   K 4.0 06/22/2021   CL 102 06/22/2021   CREATININE 0.92 06/22/2021   BUN 13 06/22/2021   CO2 29 06/22/2021   TSH 2.10 06/22/2021   PSA 1.08 06/22/2021   INR 0.99 04/05/2014   HGBA1C 5.5 06/04/2013    Lab Results  Component Value Date   TSH 2.10 06/22/2021   Lab Results  Component Value Date   WBC 6.2 06/22/2021   HGB 15.5 06/22/2021   HCT 45.5 06/22/2021   MCV 93.4 06/22/2021   PLT 228.0 06/22/2021   Lab Results  Component Value Date   NA 140 06/22/2021   K 4.0 06/22/2021   CO2 29 06/22/2021    GLUCOSE 95 06/22/2021   BUN 13 06/22/2021   CREATININE 0.92 06/22/2021   BILITOT 0.8 06/22/2021   ALKPHOS 64 06/22/2021   AST 18 06/22/2021   ALT 19 06/22/2021   PROT 6.4 06/22/2021   ALBUMIN 4.3 06/22/2021   CALCIUM 9.4 06/22/2021   GFR 91.85 06/22/2021   Lab Results  Component Value Date   CHOL 185 06/22/2021   Lab Results  Component Value Date   HDL 37.00 (L) 06/22/2021   Lab Results  Component Value Date   LDLCALC 131 (H) 06/22/2021   Lab Results  Component Value Date   TRIG 89.0 06/22/2021   Lab Results  Component Value Date   CHOLHDL 5 06/22/2021   Lab Results  Component Value Date   HGBA1C 5.5 06/04/2013       Assessment & Plan:   Problem List Items Addressed This Visit       Unprioritized   Wart of hand - Primary    Cryo -----x3 cycles  abx per orders Xray       Relevant Medications   cephALEXin (KEFLEX) 500 MG capsule   Pain in finger of right hand    abx per orders  Xray today rto prn       Relevant Orders   DG Finger Index Right   Other Visit Diagnoses     Chronic midline low back pain with left-sided sciatica  Relevant Medications   diclofenac (VOLTAREN) 75 MG EC tablet   Cellulitis of finger of right hand       Relevant Medications   cephALEXin (KEFLEX) 500 MG capsule        Meds ordered this encounter  Medications   cephALEXin (KEFLEX) 500 MG capsule    Sig: Take 1 capsule (500 mg total) by mouth 2 (two) times daily.    Dispense:  20 capsule    Refill:  0    I, Ann Held, DO, personally preformed the services described in this documentation.  All medical record entries made by the scribe were at my direction and in my presence.  I have reviewed the chart and discharge instructions (if applicable) and agree that the record reflects my personal performance and is accurate and complete. 05/09/2022   I,Tinashe Williams,acting as a scribe for Ann Held, DO.,have documented all relevant  documentation on the behalf of Ann Held, DO,as directed by  Ann Held, DO while in the presence of Ann Held, DO.    Ann Held, DO

## 2022-05-15 ENCOUNTER — Ambulatory Visit: Payer: BC Managed Care – PPO | Admitting: Adult Health

## 2022-05-22 ENCOUNTER — Encounter (INDEPENDENT_AMBULATORY_CARE_PROVIDER_SITE_OTHER): Payer: Self-pay

## 2022-06-04 ENCOUNTER — Telehealth: Payer: Self-pay | Admitting: Family Medicine

## 2022-06-04 NOTE — Telephone Encounter (Signed)
Pt called stating that his ortho doctor was wanting him to reach out to Dr. Etter Sjogren to look into refilling his Gabapentin '300mg'$  if this medication was going to be needed on a recurring basis. After reviewing chart, did not see where that medication was on his list and advised him that a note would be sent back regarding this. Pt acknowledged understanding.

## 2022-06-05 MED ORDER — GABAPENTIN 300 MG PO CAPS: 300.0000 mg | ORAL_CAPSULE | Freq: Two times a day (BID) | ORAL | 1 refills | Status: DC

## 2022-06-05 NOTE — Telephone Encounter (Signed)
Spoke with pt. Pt states taking Gabapentin 300 MG 1 tab twice daily. Rx sent. FYI

## 2022-07-22 NOTE — Progress Notes (Deleted)
[facility] [address] [place]. [place] [address] [phone]       OFFICE FOLLOW UP NOTE  [name] Date of Birth:  Sep 07, 1961 Medical Record Number:  [id number]   Reason for visit: Initial CPAP follow-up    SUBJECTIVE:   CHIEF COMPLAINT:  No chief complaint on file.   HPI:   Update 07/23/2022 JM: Patient returns for CPAP follow-up after prior visit 8 months ago.       History provided for reference purposes only Update 11/14/2021 JM: Patient returns for initial CPAP follow-up.  Consult visit with Dr. Brett Fairy 08/08/2021 for complaints of daytime fatigue, witnessed apnea, snoring, nocturia and headaches.  Completed split-night study 09/30/2021 which showed total AHI of 103.3/hr. per Dr. Brett Fairy " he did not respond completely to CPAP, but was not switched to BiPAP in the study.  Although BiPAP cannot solve his problem when he enters REM sleep". AutoPAP and referred to ENT to improve nasal airflow.  Received CPAP machine 10/04/2021. Eval by ENT 10/12/2021 with concern of nasal obstruction and recommended bilateral inferior turbinate submucosal resection which was performed on 11/02/2021.   CPAP compliance report from 10/15/2021 -11/13/2021 shows 29 out of 30 usage days with 29 days greater than 4 hours for 97% compliance.  Average usage 7 hours and 48 minutes.  Residual AHI 2.9.  Pressure in the 95th percentile 17.9 with pressure settings 5-20 and EPR level 2.  Leaks in the 95th percentile 33.1.  Reports doing well on CPAP. He notes a great improvement of energy level and sleeping better. Mentions some difficulty with his mask as it will slide up on his face and will have to frequently readjust. Currently using full facemask. Otherwise, no CPAP concerns. He follows with [facility].  Epworth Sleepiness Scale 2/24 (prior 11/24).  Fatigue severity scale 10/63 (prior 28/63). He plans on starting to focus on weight loss and he notes weight gain since  COVID.   He does mention approx 2 weeks ago, he attempted to donate plasma which he has been routinely in the past. The nurse noted BLE edema and unable to donate. He has not yet had f/u with PCP regarding this. He questions if he truly does have swelling. Hx of left knee replacement but otherwise no BLE procedures. Denies BLE pain, prior injury, prior dx of venous insufficiency. No warmth or redness noted.    Consult visit 08/08/2021 Dr. Brett Fairy: Shane Haney is a 61 y.o. year old White or Caucasian male patient seen here as a referral on 08/08/2021 from PCP for a Sleep consultation. .  Chief concern according to patient :  Too sleepy, wife witnessed apneas, snoring. Nocturia and headaches reported.    I have the pleasure of seeing Shane Haney today, a right-handed  Caucasian male with a possible sleep disorder.  She has a  has a past medical history of Diverticulosis, cardia -Dysrhythmia, Gastroenteritis (08/20/2017), H/O echocardiogram (2010), Hyperlipidemia, Hypertension, Osteoarthritis, and Snores.   Sleep relevant medical history: Morbid obesity, SOB, Nocturia 2-5 , had Tonsillectomy and sinus surgery, cervical spine surgery/anterior fusion , 7 years ago, knee 8 years ago,  deviated septum- sport accident- cant breathe through his nose.     Family medical /sleep history:  Brothers on CPAP with OSA, father may have had it.    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 Family status is married , with 2 children, at  home, 2 dogs , 2 cats.  The patient currently works. Tobacco use; none .  ETOH use ; vodka, weekends,  Caffeine intake in form of Coffee( 2 cups in AM ) Soda(  5-6 days a week-) Tea ( /) or energy drinks. Regular exercise daily..     Sleep habits are as follows: The patient's dinner time is between 6-7  PM. The patient goes to bed at 11 PM and continues to sleep for intervals of 2  hours.    The preferred sleep position is right sided, with the support of 2 pillows.  Dreams are reportedly frequent.  7  AM is the usual rise time. The patient wakes up spontaneously 6.30-45  He reports not feeling refreshed or restored in AM, with symptoms such as dry mouth, morning headaches, and residual fatigue.  The patient reports being able to sleep whenever not physically active or mentally stimulated.  If he is relaxed and at rest and especially after a meal he is likely to fall asleep.   Naps are taken frequently, lasting from 30- to 60 minutes and are more refreshing than nocturnal sleep.         ROS:   14 system review of systems performed and negative with exception of those listed in HPI  PMH:  Past Medical History:  Diagnosis Date   Diverticulosis    Dysrhythmia    "irregular at one time" (07/21/2013)   Gastroenteritis 08/20/2017   H/O echocardiogram 2010   seen by Dr. Stanford Breed- told to return as needed    Hyperlipidemia    Hypertension    Dr, Stanford Breed told pt. in 2010, ? extra beats.   Neuromuscular disorder ([place])    spinal cord compression, carpal tunnel - R hand  . Left 3 fingers remains with some tingling.   Osteoarthritis    "knees and wrists" (07/21/2013)   Sleep apnea 10/02/2021   Snores     PSH:  Past Surgical History:  Procedure Laterality Date   ANTERIOR CERVICAL DECOMP/DISCECTOMY FUSION N/A 07/08/2013   Procedure: ANTERIOR CERVICAL DECOMPRESSION/DISCECTOMY FUSION 2 LEVELS C3-C5;  Surgeon: Melina Schools, MD;  Location: [place];  Service: Orthopedics;  Laterality: N/A;   ANTERIOR CRUCIATE LIGAMENT REPAIR Left 1999   CARPAL TUNNEL RELEASE Left 05/2013   CARPAL TUNNEL RELEASE Right 07/08/2013   Procedure: LIMITED OPEN RIGHT CARPAL TUNNEL RELEASE;  Surgeon: Roseanne Kaufman, MD;  Location: [place];  Service: Orthopedics;  Laterality: Right;   INCISION AND DRAINAGE OF WOUND N/A 07/21/2013   Procedure: IRRIGATION AND DEBRIDEMENT OF CERVICAL WOUND;  Surgeon: Melina Schools, MD;  Location: [place];  Service: Orthopedics;  Laterality: N/A;   KNEE ARTHROSCOPY Right 2007   OPEN REDUCTION INTERNAL FIXATION (ORIF) FOOT LISFRANC FRACTURE Right 1993   R- /w ORIF   TONSILLECTOMY     TOTAL KNEE ARTHROPLASTY Left 04/11/2014   Procedure: LEFT TOTAL KNEE ARTHROPLASTY WITH HARDWARE REMOVAL;  Surgeon: [name], MD;  Location: [facility];  Service: Orthopedics;  Laterality: Left;    Social History:  Social History   Socioeconomic History   Marital status: Married    Spouse name: Billi   Number of children: 2   Years of education: Not on file   Highest education level: Bachelor's degree (e.g., BA, AB, BS)  Occupational History   Occupation: Pharmacist, hospital--- [employer]    Employer: [employer]  Tobacco Use   Smoking status: Never   Smokeless tobacco: Never  Substance and Sexual Activity   Alcohol use: Yes  Alcohol/week: 4.0 standard drinks of alcohol    Types: 2 Cans of beer, 2 Shots of liquor per week    Comment: 04-05-14 "3-5 mixed drinks or 6 beers on the weekends   Drug use: No   Sexual activity: Yes    Partners: Female  Other Topics Concern   Not on file  Social History Narrative   Lives with wife and 2 children   Ambidextrous, eats and writes r handed. Throws and kicks w left hand/foot   Caffeine: 2 cups of coffee a day, diet mtn dew before workout      Social Determinants of Health   Financial Resource Strain: Not on file  Food Insecurity: Not on file  Transportation Needs: Not on file  Physical Activity: Not on file  Stress: Not on file  Social Connections: Not on file  Intimate Partner Violence: Not on file    Family History:  Family History  Problem Relation Age of Onset   Stroke Father    Hypertension Father    Alcohol abuse Father    Hypertension Mother    Aneurysm Mother        Brain   HIV Brother        passed away from AIDS   Colon cancer Neg Hx    Colon polyps Neg Hx     Medications:   Current Outpatient Medications  on File Prior to Visit  Medication Sig Dispense Refill   cephALEXin (KEFLEX) 500 MG capsule Take 1 capsule (500 mg total) by mouth 2 (two) times daily. 20 capsule 0   diclofenac (VOLTAREN) 75 MG EC tablet Take 1 tablet (75 mg total) by mouth 2 (two) times daily. 30 tablet 0   gabapentin (NEURONTIN) 300 MG capsule Take 1 capsule (300 mg total) by mouth 2 (two) times daily. 180 capsule 1   Multiple Vitamin (MULTIVITAMIN WITH MINERALS) TABS tablet Take 1 tablet by mouth daily.     Oxymetazoline HCl (NASAL SPRAY) 0.05 % SOLN Place into the nose.     valsartan (DIOVAN) 320 MG tablet TAKE 1 TABLET BY MOUTH DAILY 90 tablet 1   No current facility-administered medications on file prior to visit.    Allergies:  No Known Allergies    OBJECTIVE:  Physical Exam  There were no vitals filed for this visit.  There is no height or weight on file to calculate BMI. No results found.  General: Morbidly obese very pleasant middle-age Caucasian male, seated, in no evident distress Head: head normocephalic and atraumatic.   Neck: supple with no carotid or supraclavicular bruits.  Circumference 21" Cardiovascular: regular rate and rhythm, no murmurs Musculoskeletal: no deformity Skin:  no rash/petichiae; trace edema BLE Vascular:  Normal pulses all extremities including pedal pulses   Neurologic Exam Mental Status: Awake and fully alert. Oriented to place and time. Recent and remote memory intact. Attention span, concentration and fund of knowledge appropriate. Mood and affect appropriate.  Cranial Nerves: Pupils equal, briskly reactive to light. Extraocular movements full without nystagmus. Visual fields full to confrontation. Hearing intact. Facial sensation intact. Face, tongue, palate moves normally and symmetrically.  Motor: Normal bulk and tone. Normal strength in all tested extremity muscles Sensory.: intact to touch , pinprick , position and vibratory sensation.  Coordination: Rapid  alternating movements normal in all extremities. Finger-to-nose and heel-to-shin performed accurately bilaterally. Gait and Station: Arises from chair without difficulty. Stance is normal. Gait demonstrates normal stride length and balance without use of AD. Tandem walk and heel toe  without difficulty.  Reflexes: 1+ and symmetric. Toes downgoing.         ASSESSMENT/PLAN: Shane Haney is a 61 y.o. year old male with diagnosis of severe sleep apnea with total AHI 103.3/h per split-night study 09/30/2021. 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 Also notes BLE edema     Severe OSA: greatly improved and well controlled on CPAP, excellent compliance with optimal residual AHI 2.9. order will be placed to DME company for mask refitting due to concerns of leaks and mask sliding. Advised any further CPAP related concerns to follow-up with DME company.  Continue nightly CPAP compliance BLE edema: unsure chronic as patient unaware of edema. Besides trace edema, no other concerning findings or red flag symptoms. Advised to f/u with PCP for further evaluation     Follow up in 6 months or call earlier if needed   CC:  PCP: Carollee Herter, Alferd Apa, DO    I spent 34 minutes of face-to-face and non-face-to-face time with patient.  This included previsit chart review, lab review, study review, order entry, electronic health record documentation, patient education regarding recent diagnosis of severe sleep apnea, review and discussion of compliance report, importance of nightly CPAP usage, and answered all the questions as noted above to patient satisfaction   Frann Rider, AGNP-BC  [facility] [address] [place], [place] [address]  Phone [phone] Fax [phone] Note: This document was prepared with digital dictation and possible smart phrase technology. Any transcriptional errors  that result from this process are unintentional.

## 2022-07-23 ENCOUNTER — Ambulatory Visit: Payer: BC Managed Care – PPO | Admitting: Adult Health

## 2022-07-24 NOTE — Progress Notes (Deleted)
[facility] [address] [place]. [place] [address] [phone]       OFFICE FOLLOW UP NOTE  Mr. ZYEIR DYMEK Date of Birth:  02/02/1961 Medical Record Number:  [id number]   Reason for visit: Initial CPAP follow-up    SUBJECTIVE:   CHIEF COMPLAINT:  No chief complaint on file.   HPI:   Update 07/24/2022 JM: Patient returns for CPAP follow-up after prior visit 8 months ago.       History provided for reference purposes only Update 11/14/2021 JM: Patient returns for initial CPAP follow-up.  Consult visit with Dr. Brett Fairy 08/08/2021 for complaints of daytime fatigue, witnessed apnea, snoring, nocturia and headaches.  Completed split-night study 09/30/2021 which showed total AHI of 103.3/hr. per Dr. Brett Fairy " he did not respond completely to CPAP, but was not switched to BiPAP in the study.  Although BiPAP cannot solve his problem when he enters REM sleep". AutoPAP and referred to ENT to improve nasal airflow.  Received CPAP machine 10/04/2021. Eval by ENT 10/12/2021 with concern of nasal obstruction and recommended bilateral inferior turbinate submucosal resection which was performed on 11/02/2021.   CPAP compliance report from 10/15/2021 -11/13/2021 shows 29 out of 30 usage days with 29 days greater than 4 hours for 97% compliance.  Average usage 7 hours and 48 minutes.  Residual AHI 2.9.  Pressure in the 95th percentile 17.9 with pressure settings 5-20 and EPR level 2.  Leaks in the 95th percentile 33.1.  Reports doing well on CPAP. He notes a great improvement of energy level and sleeping better. Mentions some difficulty with his mask as it will slide up on his face and will have to frequently readjust. Currently using full facemask. Otherwise, no CPAP concerns. He follows with [facility].  Epworth Sleepiness Scale 2/24 (prior 11/24).  Fatigue severity scale 10/63 (prior 28/63). He plans on starting to focus on weight loss and he notes weight gain since  COVID.   He does mention approx 2 weeks ago, he attempted to donate plasma which he has been routinely in the past. The nurse noted BLE edema and unable to donate. He has not yet had f/u with PCP regarding this. He questions if he truly does have swelling. Hx of left knee replacement but otherwise no BLE procedures. Denies BLE pain, prior injury, prior dx of venous insufficiency. No warmth or redness noted.    Consult visit 08/08/2021 Dr. Brett Fairy: JAMAR WEATHERALL is a 61 y.o. year old White or Caucasian male patient seen here as a referral on 08/08/2021 from PCP for a Sleep consultation. .  Chief concern according to patient :  Too sleepy, wife witnessed apneas, snoring. Nocturia and headaches reported.    I have the pleasure of seeing KAPENA HAMME today, a right-handed  Caucasian male with a possible sleep disorder.  She has a  has a past medical history of Diverticulosis, cardia -Dysrhythmia, Gastroenteritis (08/20/2017), H/O echocardiogram (2010), Hyperlipidemia, Hypertension, Osteoarthritis, and Snores.   Sleep relevant medical history: Morbid obesity, SOB, Nocturia 2-5 , had Tonsillectomy and sinus surgery, cervical spine surgery/anterior fusion , 7 years ago, knee 8 years ago,  deviated septum- sport accident- cant breathe through his nose.     Family medical /sleep history:  Brothers on CPAP with OSA, father may have had it.    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 Family status is married , with 2 children, at  home, 2 dogs , 2 cats.  The patient currently works. Tobacco use; none .  ETOH use ; vodka, weekends,  Caffeine intake in form of Coffee( 2 cups in AM ) Soda(  5-6 days a week-) Tea ( /) or energy drinks. Regular exercise daily..     Sleep habits are as follows: The patient's dinner time is between 6-7  PM. The patient goes to bed at 11 PM and continues to sleep for intervals of 2  hours.    The preferred sleep position is right sided, with the support of 2 pillows.  Dreams are reportedly frequent.  7  AM is the usual rise time. The patient wakes up spontaneously 6.30-45  He reports not feeling refreshed or restored in AM, with symptoms such as dry mouth, morning headaches, and residual fatigue.  The patient reports being able to sleep whenever not physically active or mentally stimulated.  If he is relaxed and at rest and especially after a meal he is likely to fall asleep.   Naps are taken frequently, lasting from 30- to 60 minutes and are more refreshing than nocturnal sleep.         ROS:   14 system review of systems performed and negative with exception of those listed in HPI  PMH:  Past Medical History:  Diagnosis Date   Diverticulosis    Dysrhythmia    "irregular at one time" (07/21/2013)   Gastroenteritis 08/20/2017   H/O echocardiogram 2010   seen by Dr. Stanford Breed- told to return as needed    Hyperlipidemia    Hypertension    Dr, Stanford Breed told pt. in 2010, ? extra beats.   Neuromuscular disorder ([place])    spinal cord compression, carpal tunnel - R hand  . Left 3 fingers remains with some tingling.   Osteoarthritis    "knees and wrists" (07/21/2013)   Sleep apnea 10/02/2021   Snores     PSH:  Past Surgical History:  Procedure Laterality Date   ANTERIOR CERVICAL DECOMP/DISCECTOMY FUSION N/A 07/08/2013   Procedure: ANTERIOR CERVICAL DECOMPRESSION/DISCECTOMY FUSION 2 LEVELS C3-C5;  Surgeon: Melina Schools, MD;  Location: [place];  Service: Orthopedics;  Laterality: N/A;   ANTERIOR CRUCIATE LIGAMENT REPAIR Left 1999   CARPAL TUNNEL RELEASE Left 05/2013   CARPAL TUNNEL RELEASE Right 07/08/2013   Procedure: LIMITED OPEN RIGHT CARPAL TUNNEL RELEASE;  Surgeon: Roseanne Kaufman, MD;  Location: [place];  Service: Orthopedics;  Laterality: Right;   INCISION AND DRAINAGE OF WOUND N/A 07/21/2013   Procedure: IRRIGATION AND DEBRIDEMENT OF CERVICAL WOUND;  Surgeon: Melina Schools, MD;  Location: [place];  Service: Orthopedics;  Laterality: N/A;   KNEE ARTHROSCOPY Right 2007   OPEN REDUCTION INTERNAL FIXATION (ORIF) FOOT LISFRANC FRACTURE Right 1993   R- /w ORIF   TONSILLECTOMY     TOTAL KNEE ARTHROPLASTY Left 04/11/2014   Procedure: LEFT TOTAL KNEE ARTHROPLASTY WITH HARDWARE REMOVAL;  Surgeon: [name], MD;  [facility];  Service: Orthopedics;  Laterality: Left;    Social History:  Social History   Socioeconomic History   Marital status: Married    Spouse name: Billi   Number of children: 2   Years of education: Not on file   Highest education level: Bachelor's degree (e.g., BA, AB, BS)  Occupational History   Occupation: Pharmacist, hospital--- [employer]    Employer: [employer]  Tobacco Use   Smoking status: Never   Smokeless tobacco: Never  Substance and Sexual Activity   Alcohol use: Yes  Alcohol/week: 4.0 standard drinks of alcohol    Types: 2 Cans of beer, 2 Shots of liquor per week    Comment: 04-05-14 "3-5 mixed drinks or 6 beers on the weekends   Drug use: No   Sexual activity: Yes    Partners: Female  Other Topics Concern   Not on file  Social History Narrative   Lives with wife and 2 children   Ambidextrous, eats and writes r handed. Throws and kicks w left hand/foot   Caffeine: 2 cups of coffee a day, diet mtn dew before workout      Social Determinants of Health   Financial Resource Strain: Not on file  Food Insecurity: Not on file  Transportation Needs: Not on file  Physical Activity: Not on file  Stress: Not on file  Social Connections: Not on file  Intimate Partner Violence: Not on file    Family History:  Family History  Problem Relation Age of Onset   Stroke Father    Hypertension Father    Alcohol abuse Father    Hypertension Mother    Aneurysm Mother        Brain   HIV Brother        passed away from AIDS   Colon cancer Neg Hx    Colon polyps Neg Hx     Medications:   Current Outpatient Medications  on File Prior to Visit  Medication Sig Dispense Refill   cephALEXin (KEFLEX) 500 MG capsule Take 1 capsule (500 mg total) by mouth 2 (two) times daily. 20 capsule 0   diclofenac (VOLTAREN) 75 MG EC tablet Take 1 tablet (75 mg total) by mouth 2 (two) times daily. 30 tablet 0   gabapentin (NEURONTIN) 300 MG capsule Take 1 capsule (300 mg total) by mouth 2 (two) times daily. 180 capsule 1   Multiple Vitamin (MULTIVITAMIN WITH MINERALS) TABS tablet Take 1 tablet by mouth daily.     Oxymetazoline HCl (NASAL SPRAY) 0.05 % SOLN Place into the nose.     valsartan (DIOVAN) 320 MG tablet TAKE 1 TABLET BY MOUTH DAILY 90 tablet 1   No current facility-administered medications on file prior to visit.    Allergies:  No Known Allergies    OBJECTIVE:  Physical Exam  There were no vitals filed for this visit.  There is no height or weight on file to calculate BMI. No results found.  General: Morbidly obese very pleasant middle-age Caucasian male, seated, in no evident distress Head: head normocephalic and atraumatic.   Neck: supple with no carotid or supraclavicular bruits.  Circumference 21" Cardiovascular: regular rate and rhythm, no murmurs Musculoskeletal: no deformity Skin:  no rash/petichiae; trace edema BLE Vascular:  Normal pulses all extremities including pedal pulses   Neurologic Exam Mental Status: Awake and fully alert. Oriented to place and time. Recent and remote memory intact. Attention span, concentration and fund of knowledge appropriate. Mood and affect appropriate.  Cranial Nerves: Pupils equal, briskly reactive to light. Extraocular movements full without nystagmus. Visual fields full to confrontation. Hearing intact. Facial sensation intact. Face, tongue, palate moves normally and symmetrically.  Motor: Normal bulk and tone. Normal strength in all tested extremity muscles Sensory.: intact to touch , pinprick , position and vibratory sensation.  Coordination: Rapid  alternating movements normal in all extremities. Finger-to-nose and heel-to-shin performed accurately bilaterally. Gait and Station: Arises from chair without difficulty. Stance is normal. Gait demonstrates normal stride length and balance without use of AD. Tandem walk and heel toe  without difficulty.  Reflexes: 1+ and symmetric. Toes downgoing.         ASSESSMENT/PLAN: KOHAN AZIZI is a 60 y.o. year old male with diagnosis of severe sleep apnea with total AHI 103.3/h per split-night study 09/30/2021. 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 Also notes BLE edema     Severe OSA: greatly improved and well controlled on CPAP, excellent compliance with optimal residual AHI 2.9. order will be placed to DME company for mask refitting due to concerns of leaks and mask sliding. Advised any further CPAP related concerns to follow-up with DME company.  Continue nightly CPAP compliance BLE edema: unsure chronic as patient unaware of edema. Besides trace edema, no other concerning findings or red flag symptoms. Advised to f/u with PCP for further evaluation     Follow up in 6 months or call earlier if needed   CC:  PCP: Carollee Herter, Alferd Apa, DO    I spent 34 minutes of face-to-face and non-face-to-face time with patient.  This included previsit chart review, lab review, study review, order entry, electronic health record documentation, patient education regarding recent diagnosis of severe sleep apnea, review and discussion of compliance report, importance of nightly CPAP usage, and answered all the questions as noted above to patient satisfaction   Frann Rider, AGNP-BC  [facility] [address] [place], [place] [address]  Phone [phone] Fax [phone] Note: This document was prepared with digital dictation and possible smart phrase technology. Any transcriptional errors  that result from this process are unintentional.

## 2022-07-25 ENCOUNTER — Ambulatory Visit: Payer: BC Managed Care – PPO | Admitting: Adult Health

## 2022-07-29 NOTE — Progress Notes (Unsigned)
[facility] [address] [place]. [place] [address] [phone]       OFFICE FOLLOW UP NOTE  [name] Date of Birth:  01/27/1961 Medical Record Number:  [id number]   Reason for visit: Initial CPAP follow-up    SUBJECTIVE:   CHIEF COMPLAINT:  No chief complaint on file.   HPI:   Update 07/30/2022 JM: Patient returns for CPAP follow-up after prior visit 8 months ago.       History provided for reference purposes only Update 11/14/2021 JM: Patient returns for initial CPAP follow-up.  Consult visit with Dr. Brett Fairy 08/08/2021 for complaints of daytime fatigue, witnessed apnea, snoring, nocturia and headaches.  Completed split-night study 09/30/2021 which showed total AHI of 103.3/hr. per Dr. Brett Fairy " he did not respond completely to CPAP, but was not switched to BiPAP in the study.  Although BiPAP cannot solve his problem when he enters REM sleep". AutoPAP and referred to ENT to improve nasal airflow.  Received CPAP machine 10/04/2021. Eval by ENT 10/12/2021 with concern of nasal obstruction and recommended bilateral inferior turbinate submucosal resection which was performed on 11/02/2021.   CPAP compliance report from 10/15/2021 -11/13/2021 shows 29 out of 30 usage days with 29 days greater than 4 hours for 97% compliance.  Average usage 7 hours and 48 minutes.  Residual AHI 2.9.  Pressure in the 95th percentile 17.9 with pressure settings 5-20 and EPR level 2.  Leaks in the 95th percentile 33.1.  Reports doing well on CPAP. He notes a great improvement of energy level and sleeping better. Mentions some difficulty with his mask as it will slide up on his face and will have to frequently readjust. Currently using full facemask. Otherwise, no CPAP concerns. He follows with [facility].  Epworth Sleepiness Scale 2/24 (prior 11/24).  Fatigue severity scale 10/63 (prior 28/63). He plans on starting to focus on weight loss and he notes weight gain since  COVID.   He does mention approx 2 weeks ago, he attempted to donate plasma which he has been routinely in the past. The nurse noted BLE edema and unable to donate. He has not yet had f/u with PCP regarding this. He questions if he truly does have swelling. Hx of left knee replacement but otherwise no BLE procedures. Denies BLE pain, prior injury, prior dx of venous insufficiency. No warmth or redness noted.    Consult visit 08/08/2021 Dr. Brett Fairy: Shane Haney is a 61 y.o. year old White or Caucasian male patient seen here as a referral on 08/08/2021 from PCP for a Sleep consultation. .  Chief concern according to patient :  Too sleepy, wife witnessed apneas, snoring. Nocturia and headaches reported.    I have the pleasure of seeing Shane Haney today, a right-handed  Caucasian male with a possible sleep disorder.  She has a  has a past medical history of Diverticulosis, cardia -Dysrhythmia, Gastroenteritis (08/20/2017), H/O echocardiogram (2010), Hyperlipidemia, Hypertension, Osteoarthritis, and Snores.   Sleep relevant medical history: Morbid obesity, SOB, Nocturia 2-5 , had Tonsillectomy and sinus surgery, cervical spine surgery/anterior fusion , 7 years ago, knee 8 years ago,  deviated septum- sport accident- cant breathe through his nose.     Family medical /sleep history:  Brothers on CPAP with OSA, father may have had it.    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 Family status is married , with 2 children, at  home, 2 dogs , 2 cats.  The patient currently works. Tobacco use; none .  ETOH use ; vodka, weekends,  Caffeine intake in form of Coffee( 2 cups in AM ) Soda(  5-6 days a week-) Tea ( /) or energy drinks. Regular exercise daily..     Sleep habits are as follows: The patient's dinner time is between 6-7  PM. The patient goes to bed at 11 PM and continues to sleep for intervals of 2  hours.    The preferred sleep position is right sided, with the support of 2 pillows.  Dreams are reportedly frequent.  7  AM is the usual rise time. The patient wakes up spontaneously 6.30-45  He reports not feeling refreshed or restored in AM, with symptoms such as dry mouth, morning headaches, and residual fatigue.  The patient reports being able to sleep whenever not physically active or mentally stimulated.  If he is relaxed and at rest and especially after a meal he is likely to fall asleep.   Naps are taken frequently, lasting from 30- to 60 minutes and are more refreshing than nocturnal sleep.         ROS:   14 system review of systems performed and negative with exception of those listed in HPI  PMH:  Past Medical History:  Diagnosis Date   Diverticulosis    Dysrhythmia    "irregular at one time" (07/21/2013)   Gastroenteritis 08/20/2017   H/O echocardiogram 2010   seen by Dr. Stanford Breed- told to return as needed    Hyperlipidemia    Hypertension    Dr, Stanford Breed told pt. in 2010, ? extra beats.   Neuromuscular disorder ([place])    spinal cord compression, carpal tunnel - R hand  . Left 3 fingers remains with some tingling.   Osteoarthritis    "knees and wrists" (07/21/2013)   Sleep apnea 10/02/2021   Snores     PSH:  Past Surgical History:  Procedure Laterality Date   ANTERIOR CERVICAL DECOMP/DISCECTOMY FUSION N/A 07/08/2013   Procedure: ANTERIOR CERVICAL DECOMPRESSION/DISCECTOMY FUSION 2 LEVELS C3-C5;  Surgeon: Melina Schools, MD;  Location: [facility];  Service: Orthopedics;  Laterality: N/A;   ANTERIOR CRUCIATE LIGAMENT REPAIR Left 1999   CARPAL TUNNEL RELEASE Left 05/2013   CARPAL TUNNEL RELEASE Right 07/08/2013   Procedure: LIMITED OPEN RIGHT CARPAL TUNNEL RELEASE;  Surgeon: Roseanne Kaufman, MD;  Location: [facility];  Service: Orthopedics;  Laterality: Right;   INCISION AND DRAINAGE OF WOUND N/A 07/21/2013   Procedure: IRRIGATION AND DEBRIDEMENT OF CERVICAL WOUND;  Surgeon: Melina Schools, MD;  Location: [facility];  Service: Orthopedics;  Laterality: N/A;   KNEE ARTHROSCOPY Right 2007   OPEN REDUCTION INTERNAL FIXATION (ORIF) FOOT LISFRANC FRACTURE Right 1993   R- /w ORIF   TONSILLECTOMY     TOTAL KNEE ARTHROPLASTY Left 04/11/2014   Procedure: LEFT TOTAL KNEE ARTHROPLASTY WITH HARDWARE REMOVAL;  Surgeon: [name], MD;  Location: [facility];  Service: Orthopedics;  Laterality: Left;    Social History:  Social History   Socioeconomic History   Marital status: Married    Spouse name: Billi   Number of children: 2   Years of education: Not on file   Highest education level: Bachelor's degree (e.g., BA, AB, BS)  Occupational History   Occupation: Pharmacist, hospital--- [employer]    Employer: [employer]  Tobacco Use   Smoking status: Never   Smokeless tobacco: Never  Substance and Sexual Activity   Alcohol use: Yes  Alcohol/week: 4.0 standard drinks of alcohol    Types: 2 Cans of beer, 2 Shots of liquor per week    Comment: 04-05-14 "3-5 mixed drinks or 6 beers on the weekends   Drug use: No   Sexual activity: Yes    Partners: Female  Other Topics Concern   Not on file  Social History Narrative   Lives with wife and 2 children   Ambidextrous, eats and writes r handed. Throws and kicks w left hand/foot   Caffeine: 2 cups of coffee a day, diet mtn dew before workout      Social Determinants of Health   Financial Resource Strain: Not on file  Food Insecurity: Not on file  Transportation Needs: Not on file  Physical Activity: Not on file  Stress: Not on file  Social Connections: Not on file  Intimate Partner Violence: Not on file    Family History:  Family History  Problem Relation Age of Onset   Stroke Father    Hypertension Father    Alcohol abuse Father    Hypertension Mother    Aneurysm Mother        Brain   HIV Brother        passed away from AIDS   Colon cancer Neg Hx    Colon polyps Neg Hx     Medications:   Current Outpatient Medications  on File Prior to Visit  Medication Sig Dispense Refill   cephALEXin (KEFLEX) 500 MG capsule Take 1 capsule (500 mg total) by mouth 2 (two) times daily. 20 capsule 0   diclofenac (VOLTAREN) 75 MG EC tablet Take 1 tablet (75 mg total) by mouth 2 (two) times daily. 30 tablet 0   gabapentin (NEURONTIN) 300 MG capsule Take 1 capsule (300 mg total) by mouth 2 (two) times daily. 180 capsule 1   Multiple Vitamin (MULTIVITAMIN WITH MINERALS) TABS tablet Take 1 tablet by mouth daily.     Oxymetazoline HCl (NASAL SPRAY) 0.05 % SOLN Place into the nose.     valsartan (DIOVAN) 320 MG tablet TAKE 1 TABLET BY MOUTH DAILY 90 tablet 1   No current facility-administered medications on file prior to visit.    Allergies:  No Known Allergies    OBJECTIVE:  Physical Exam  There were no vitals filed for this visit.  There is no height or weight on file to calculate BMI. No results found.  General: Morbidly obese very pleasant middle-age Caucasian male, seated, in no evident distress Head: head normocephalic and atraumatic.   Neck: supple with no carotid or supraclavicular bruits.  Circumference 21" Cardiovascular: regular rate and rhythm, no murmurs Musculoskeletal: no deformity Skin:  no rash/petichiae; trace edema BLE Vascular:  Normal pulses all extremities including pedal pulses   Neurologic Exam Mental Status: Awake and fully alert. Oriented to place and time. Recent and remote memory intact. Attention span, concentration and fund of knowledge appropriate. Mood and affect appropriate.  Cranial Nerves: Pupils equal, briskly reactive to light. Extraocular movements full without nystagmus. Visual fields full to confrontation. Hearing intact. Facial sensation intact. Face, tongue, palate moves normally and symmetrically.  Motor: Normal bulk and tone. Normal strength in all tested extremity muscles Sensory.: intact to touch , pinprick , position and vibratory sensation.  Coordination: Rapid  alternating movements normal in all extremities. Finger-to-nose and heel-to-shin performed accurately bilaterally. Gait and Station: Arises from chair without difficulty. Stance is normal. Gait demonstrates normal stride length and balance without use of AD. Tandem walk and heel toe  without difficulty.  Reflexes: 1+ and symmetric. Toes downgoing.         ASSESSMENT/PLAN: Shane Haney is a 61 y.o. year old male with diagnosis of severe sleep apnea with total AHI 103.3/h per split-night study 09/30/2021. 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 Also notes BLE edema     Severe OSA: greatly improved and well controlled on CPAP, excellent compliance with optimal residual AHI 2.9. order will be placed to DME company for mask refitting due to concerns of leaks and mask sliding. Advised any further CPAP related concerns to follow-up with DME company.  Continue nightly CPAP compliance BLE edema: unsure chronic as patient unaware of edema. Besides trace edema, no other concerning findings or red flag symptoms. Advised to f/u with PCP for further evaluation     Follow up in 6 months or call earlier if needed   CC:  PCP: Carollee Herter, Alferd Apa, DO    I spent 34 minutes of face-to-face and non-face-to-face time with patient.  This included previsit chart review, lab review, study review, order entry, electronic health record documentation, patient education regarding recent diagnosis of severe sleep apnea, review and discussion of compliance report, importance of nightly CPAP usage, and answered all the questions as noted above to patient satisfaction   Frann Rider, AGNP-BC  [facility] [address] [place], [place] [address]  Phone [phone] Fax [phone] Note: This document was prepared with digital dictation and possible smart phrase technology. Any transcriptional errors  that result from this process are unintentional.

## 2022-07-30 ENCOUNTER — Ambulatory Visit (INDEPENDENT_AMBULATORY_CARE_PROVIDER_SITE_OTHER): Payer: BC Managed Care – PPO | Admitting: Adult Health

## 2022-07-30 ENCOUNTER — Encounter: Payer: Self-pay | Admitting: Adult Health

## 2022-07-30 VITALS — BP 178/90 | HR 61 | Ht 70.0 in | Wt 308.0 lb

## 2022-07-30 DIAGNOSIS — G4733 Obstructive sleep apnea (adult) (pediatric): Secondary | ICD-10-CM

## 2022-07-30 DIAGNOSIS — Z9989 Dependence on other enabling machines and devices: Secondary | ICD-10-CM

## 2022-07-30 NOTE — Patient Instructions (Signed)
Your Plan:  Continue current CPAP settings  We will look into getting you switched to a different company for your CPAP supplies    Follow up in 1 year or call earlier if needed     Thank you for coming to see [place] at [facility]. I hope we have been able to provide you high quality care today.  You may receive a patient satisfaction survey over the next few weeks. We would appreciate your feedback and comments so that we may continue to improve ourselves and the health of our patients.

## 2022-08-01 NOTE — Progress Notes (Signed)
Rq to transfer CPAP management from [facility] to [facility] has be faxed to them at [phone].  Attached initial & most recent OV, pt demo, sleep study and compliance report. Confirmation received.

## 2022-10-03 ENCOUNTER — Telehealth: Payer: Self-pay | Admitting: Family Medicine

## 2022-10-03 DIAGNOSIS — I10 Essential (primary) hypertension: Secondary | ICD-10-CM

## 2022-10-03 MED ORDER — VALSARTAN 320 MG PO TABS: ORAL_TABLET | ORAL | 1 refills | Status: DC

## 2022-10-03 NOTE — Telephone Encounter (Signed)
Prescription Request  10/03/2022  Is this a "Controlled Substance" medicine? No  LOV: 05/09/2022  What is the name of the medication or equipment?   valsartan (DIOVAN) 320 MG tablet [[id number]]   Have you contacted your pharmacy to request a refill? Yes   Which pharmacy would you like this sent to?   [facility] [place], [address] [place] [address] Phone: [phone] Fax: [phone]    Patient notified that their request is being sent to the clinical staff for review and that they should receive a response within 2 business days.   Please advise at Mobile [phone] (mobile)

## 2022-10-03 NOTE — Telephone Encounter (Signed)
Refill sent.

## 2022-10-06 ENCOUNTER — Other Ambulatory Visit: Payer: Self-pay | Admitting: Family Medicine

## 2022-10-06 DIAGNOSIS — I10 Essential (primary) hypertension: Secondary | ICD-10-CM

## 2022-10-17 ENCOUNTER — Telehealth: Payer: Self-pay | Admitting: Family Medicine

## 2022-10-17 MED ORDER — GABAPENTIN 300 MG PO CAPS: 300.0000 mg | ORAL_CAPSULE | Freq: Two times a day (BID) | ORAL | 1 refills | Status: DC

## 2022-10-17 NOTE — Telephone Encounter (Signed)
Medication: gabapentin (NEURONTIN) 300 MG capsule  Has the patient contacted their pharmacy? Yes.  Preferred Pharmacy:    [facility] [place], [address] [place], [place] [address] Phone: [phone]  Fax: [phone]

## 2022-10-17 NOTE — Telephone Encounter (Signed)
Refill sent.

## 2022-11-08 ENCOUNTER — Ambulatory Visit (HOSPITAL_BASED_OUTPATIENT_CLINIC_OR_DEPARTMENT_OTHER)
Admission: RE | Admit: 2022-11-08 | Discharge: 2022-11-08 | Disposition: A | Discharge status: Home / Self Care | Payer: BC Managed Care – PPO | Source: Ambulatory Visit | Attending: Family Medicine | Admitting: Family Medicine

## 2022-11-08 ENCOUNTER — Ambulatory Visit (INDEPENDENT_AMBULATORY_CARE_PROVIDER_SITE_OTHER): Payer: BC Managed Care – PPO | Admitting: Family Medicine

## 2022-11-08 ENCOUNTER — Encounter: Payer: Self-pay | Admitting: Family Medicine

## 2022-11-08 VITALS — BP 132/88 | HR 60 | Temp 98.6°F | Resp 18 | Ht 70.0 in | Wt 291.4 lb

## 2022-11-08 DIAGNOSIS — Z23 Encounter for immunization: Secondary | ICD-10-CM | POA: Diagnosis not present

## 2022-11-08 DIAGNOSIS — Z0001 Encounter for general adult medical examination with abnormal findings: Secondary | ICD-10-CM | POA: Diagnosis not present

## 2022-11-08 DIAGNOSIS — M25552 Pain in left hip: Secondary | ICD-10-CM | POA: Diagnosis present

## 2022-11-08 DIAGNOSIS — K921 Melena: Secondary | ICD-10-CM

## 2022-11-08 DIAGNOSIS — E785 Hyperlipidemia, unspecified: Secondary | ICD-10-CM

## 2022-11-08 DIAGNOSIS — R1013 Epigastric pain: Secondary | ICD-10-CM

## 2022-11-08 DIAGNOSIS — I10 Essential (primary) hypertension: Secondary | ICD-10-CM | POA: Diagnosis not present

## 2022-11-08 DIAGNOSIS — Z0000 Encounter for general adult medical examination without abnormal findings: Secondary | ICD-10-CM

## 2022-11-08 MED ORDER — WEGOVY 0.25 MG/0.5ML ~~LOC~~ SOAJ: 0.2500 mg | SUBCUTANEOUS | 0 refills | Status: DC

## 2022-11-08 MED ORDER — FAMOTIDINE 20 MG PO TABS: 20.0000 mg | ORAL_TABLET | Freq: Two times a day (BID) | ORAL | 2 refills | Status: DC

## 2022-11-08 NOTE — Assessment & Plan Note (Signed)
Encourage heart healthy diet such as MIND or DASH diet, increase exercise, avoid trans fats, simple carbohydrates and processed foods, consider a krill or fish or flaxseed oil cap daily.  °

## 2022-11-08 NOTE — Assessment & Plan Note (Signed)
Ghm utd Check labs  See AVS  

## 2022-11-08 NOTE — Patient Instructions (Signed)
Preventive Care 50-60 Years Old, Male Preventive care refers to lifestyle choices and visits with your health care provider that can promote health and wellness. Preventive care visits are also called wellness exams. What can I expect for my preventive care visit? Counseling During your preventive care visit, your health care provider may ask about your: Medical history, including: Past medical problems. Family medical history. Current health, including: Emotional well-being. Home life and relationship well-being. Sexual activity. Lifestyle, including: Alcohol, nicotine or tobacco, and drug use. Access to firearms. Diet, exercise, and sleep habits. Safety issues such as seatbelt and bike helmet use. Sunscreen use. Work and work Statistician. Physical exam Your health care provider will check your: Height and weight. These may be used to calculate your BMI (body mass index). BMI is a measurement that tells if you are at a healthy weight. Waist circumference. This measures the distance around your waistline. This measurement also tells if you are at a healthy weight and may help predict your risk of certain diseases, such as type 2 diabetes and high blood pressure. Heart rate and blood pressure. Body temperature. Skin for abnormal spots. What immunizations do I need?  Vaccines are usually given at various ages, according to a schedule. Your health care provider will recommend vaccines for you based on your age, medical history, and lifestyle or other factors, such as travel or where you work. What tests do I need? Screening Your health care provider may recommend screening tests for certain conditions. This may include: Lipid and cholesterol levels. Diabetes screening. This is done by checking your blood sugar (glucose) after you have not eaten for a while (fasting). Hepatitis B test. Hepatitis C test. HIV (human immunodeficiency virus) test. STI (sexually transmitted infection)  testing, if you are at risk. Lung cancer screening. Prostate cancer screening. Colorectal cancer screening. Talk with your health care provider about your test results, treatment options, and if necessary, the need for more tests. Follow these instructions at home: Eating and drinking  Eat a diet that includes fresh fruits and vegetables, whole grains, lean protein, and low-fat dairy products. Take vitamin and mineral supplements as recommended by your health care provider. Do not drink alcohol if your health care provider tells you not to drink. If you drink alcohol: Limit how much you have to 0-2 drinks a day. Know how much alcohol is in your drink. In the [place] one drink equals one 12 oz bottle of beer (355 mL), one 5 oz glass of wine (148 mL), or one 1 oz glass of hard liquor (44 mL). Lifestyle Brush your teeth every morning and night with fluoride toothpaste. Floss one time each day. Exercise for at least 30 minutes 5 or more days each week. Do not use any products that contain nicotine or tobacco. These products include cigarettes, chewing tobacco, and vaping devices, such as e-cigarettes. If you need help quitting, ask your health care provider. Do not use drugs. If you are sexually active, practice safe sex. Use a condom or other form of protection to prevent STIs. Take aspirin only as told by your health care provider. Make sure that you understand how much to take and what form to take. Work with your health care provider to find out whether it is safe and beneficial for you to take aspirin daily. Find healthy ways to manage stress, such as: Meditation, yoga, or listening to music. Journaling. Talking to a trusted person. Spending time with friends and family. Minimize exposure to UV radiation to reduce  your risk of skin cancer. ?Safety ?Always wear your seat belt while driving or riding in a vehicle. ?Do not drive: ?If you have been drinking alcohol. Do not ride with someone who  has been drinking. ?When you are tired or distracted. ?While texting. ?If you have been using any mind-altering substances or drugs. ?Wear a helmet and other protective equipment during sports activities. ?If you have firearms in your house, make sure you follow all gun safety procedures. ?What's next? ?Go to your health care provider once a year for an annual wellness visit. ?Ask your health care provider how often you should have your eyes and teeth checked. ?Stay up to date on all vaccines. ?This information is not intended to replace advice given to you by your health care provider. Make sure you discuss any questions you have with your health care provider. ?Document Revised: 03/28/2021 Document Reviewed: 03/28/2021 ?Elsevier Patient Education ? 2023 [employer]vier [employer] ? ?

## 2022-11-08 NOTE — Assessment & Plan Note (Signed)
Well controlled, no changes to meds. Encouraged heart healthy diet such as the DASH diet and exercise as tolerated.  °

## 2022-11-08 NOTE — Assessment & Plan Note (Signed)
To start wegovy F/u 3 months

## 2022-11-08 NOTE — Assessment & Plan Note (Signed)
Check xray 

## 2022-11-08 NOTE — Progress Notes (Signed)
Established Patient Office Visit  Subjective   Patient ID: Shane Haney, male    DOB: 01-28-1961  Age: 62 y.o. MRN: [id number]  Chief Complaint  Patient presents with   Annual Exam    Pt states fasting     HPI Pt is here for cpe.  He had a microdisectomy in L5-s1 with dr Trenton Gammon about 5 weeks ago.   The pain and started to come back. Patient Active Problem List   Diagnosis Date Noted   Need for influenza vaccination 11/08/2022   Blood in stool 11/08/2022   Dyspepsia 11/08/2022   Left hip pain 11/08/2022   Wart of hand 05/09/2022   Pain in finger of right hand 05/09/2022   Sleep apnea 10/02/2021   Loud snoring 10/02/2021   Excessive daytime sleepiness 10/02/2021   Morbid obesity with body mass index (BMI) of 45.0 to 49.9 in adult [facility]) 10/02/2021   Chronic intermittent hypoxia with obstructive sleep apnea 10/02/2021   Severe obstructive sleep apnea-hypopnea syndrome 10/02/2021   Nasal obstruction 10/02/2021   Allergies 06/22/2021   Snoring 05/29/2021   Morbid obesity ([place]) 05/29/2021   Conjunctivitis 08/20/2017   Gastroenteritis 08/20/2017   Onychomycosis 04/08/2017   Acute bacterial sinusitis 12/05/2015   Preventative health care 12/05/2014   S/P left TKA 04/11/2014   Obesity (BMI 30-39.9) 06/05/2013   Spinal stenosis in cervical region 06/05/2013   CTS (carpal tunnel syndrome) 06/05/2013   Myalgia and myositis 06/05/2013   Left knee pain 04/19/2013   DEGENERATIVE JOINT DISEASE 10/02/2009   Hyperlipidemia 01/24/2009   SINUSITIS- ACUTE-NOS 10/28/2007   DIVERTICULOSIS, COLON 08/28/2007   Essential hypertension 08/26/2007   HEMORRHOIDS, INTERNAL 08/26/2007   PARESTHESIA, HANDS 08/26/2007   Past Medical History:  Diagnosis Date   Diverticulosis    Dysrhythmia    "irregular at one time" (07/21/2013)   Gastroenteritis 08/20/2017   H/O echocardiogram 2010   seen by Dr. Stanford Breed- told to return as needed    Hyperlipidemia    Hypertension    Dr, Stanford Breed told pt.  in 2010, ? extra beats.   Neuromuscular disorder ([place])    spinal cord compression, carpal tunnel - R hand  . Left 3 fingers remains with some tingling.   Osteoarthritis    "knees and wrists" (07/21/2013)   Sleep apnea 10/02/2021   Snores    Past Surgical History:  Procedure Laterality Date   ANTERIOR CERVICAL DECOMP/DISCECTOMY FUSION N/A 07/08/2013   Procedure: ANTERIOR CERVICAL DECOMPRESSION/DISCECTOMY FUSION 2 LEVELS C3-C5;  Surgeon: Melina Schools, MD;  Location: [place];  Service: Orthopedics;  Laterality: N/A;   ANTERIOR CRUCIATE LIGAMENT REPAIR Left 10/14/1997   CARPAL TUNNEL RELEASE Left 05/14/2013   CARPAL TUNNEL RELEASE Right 07/08/2013   Procedure: LIMITED OPEN RIGHT CARPAL TUNNEL RELEASE;  Surgeon: Roseanne Kaufman, MD;  Location: [place];  Service: Orthopedics;  Laterality: Right;   INCISION AND DRAINAGE OF WOUND N/A 07/21/2013   Procedure: IRRIGATION AND DEBRIDEMENT OF CERVICAL WOUND;  Surgeon: Melina Schools, MD;  Location: [place];  Service: Orthopedics;  Laterality: N/A;   KNEE ARTHROSCOPY Right 10/14/2005   MICRODISCECTOMY LUMBAR  2023   OPEN REDUCTION INTERNAL FIXATION (ORIF) FOOT LISFRANC FRACTURE Right 10/15/1991   R- /w ORIF   TONSILLECTOMY     TOTAL KNEE ARTHROPLASTY Left 04/11/2014   Procedure: LEFT TOTAL KNEE ARTHROPLASTY WITH HARDWARE REMOVAL;  Surgeon: [name], MD;  Location[facility];  Service: Orthopedics;  Laterality: Left;   Social History   Tobacco Use   Smoking status: Never  Smokeless tobacco: Never  Substance Use Topics   Alcohol use: Yes    Alcohol/week: 4.0 standard drinks of alcohol    Types: 2 Cans of beer, 2 Shots of liquor per week    Comment: 04-05-14 "3-5 mixed drinks or 6 beers on the weekends   Drug use: No   Social History   Socioeconomic History   Marital status: Married    Spouse name: Billi   Number of children: 2   Years of education: Not on file   Highest education level: Bachelor's degree (e.g., BA, AB, BS)  Occupational  History   Occupation: Pharmacist, hospital--- [employer]    Employer: [employer]  Tobacco Use   Smoking status: Never   Smokeless tobacco: Never  Substance and Sexual Activity   Alcohol use: Yes    Alcohol/week: 4.0 standard drinks of alcohol    Types: 2 Cans of beer, 2 Shots of liquor per week    Comment: 04-05-14 "3-5 mixed drinks or 6 beers on the weekends   Drug use: No   Sexual activity: Yes    Partners: Female  Other Topics Concern   Not on file  Social History Narrative   Lives with wife and 2 children   Ambidextrous, eats and writes r handed. Throws and kicks w left hand/foot   Caffeine: 2 cups of coffee a day, diet mtn dew before workout      Social Determinants of Health   Financial Resource Strain: Not on file  Food Insecurity: Not on file  Transportation Needs: Not on file  Physical Activity: Not on file  Stress: Not on file  Social Connections: Not on file  Intimate Partner Violence: Not on file   Family Status  Relation Name Status   Father  Deceased   Mother  Deceased   Brother  (Not Specified)   Neg Hx  (Not Specified)   Family History  Problem Relation Age of Onset   Stroke Father    Hypertension Father    Alcohol abuse Father    Hypertension Mother    Aneurysm Mother        Brain   HIV Brother        passed away from AIDS   Colon cancer Neg Hx    Colon polyps Neg Hx    No Known Allergies    Review of Systems  Constitutional:  Negative for fever and malaise/fatigue.  HENT:  Negative for congestion.   Eyes:  Negative for blurred vision.  Respiratory:  Negative for cough and shortness of breath.   Cardiovascular:  Negative for chest pain, palpitations and leg swelling.  Gastrointestinal:  Negative for vomiting.  Musculoskeletal:  Negative for back pain.  Skin:  Negative for rash.  Neurological:  Negative for loss of consciousness and headaches.      Objective:     BP 132/88 (BP Location: Left Arm, Patient Position: Sitting, Cuff Size:  Large)   Pulse 60   Temp 98.6 F (37 C) (Oral)   Resp 18   Ht '5\' 10"'$  (1.778 m)   Wt 291 lb 6.4 oz (132.2 kg)   SpO2 96%   BMI 41.81 kg/m  BP Readings from Last 3 Encounters:  11/08/22 132/88  07/30/22 (!) 178/90  05/09/22 140/82   Wt Readings from Last 3 Encounters:  11/08/22 291 lb 6.4 oz (132.2 kg)  07/30/22 (!) 308 lb (139.7 kg)  05/09/22 (!) 303 lb 9.6 oz (137.7 kg)   SpO2 Readings from Last 3 Encounters:  11/08/22 96%  05/09/22 97%  06/22/21 96%      Physical Exam Vitals and nursing note reviewed.  Constitutional:      General: He is not in acute distress.    Appearance: He is well-developed. He is not diaphoretic.  HENT:     Head: Normocephalic and atraumatic.     Right Ear: External ear normal.     Left Ear: External ear normal.     Nose: Nose normal.     Mouth/Throat:     Pharynx: No oropharyngeal exudate.  Eyes:     General:        Right eye: No discharge.        Left eye: No discharge.     Conjunctiva/sclera: Conjunctivae normal.     Pupils: Pupils are equal, round, and reactive to light.  Neck:     Thyroid: No thyromegaly.     Vascular: No JVD.  Cardiovascular:     Rate and Rhythm: Normal rate and regular rhythm.     Heart sounds: No murmur heard.    No friction rub. No gallop.  Pulmonary:     Effort: Pulmonary effort is normal. No respiratory distress.     Breath sounds: Normal breath sounds. No wheezing or rales.  Chest:     Chest wall: No tenderness.  Abdominal:     General: Bowel sounds are normal. There is no distension.     Palpations: Abdomen is soft. There is no mass.     Tenderness: There is no abdominal tenderness. There is no guarding or rebound.  Musculoskeletal:        General: Tenderness present. No swelling. Normal range of motion.     Cervical back: Normal range of motion and neck supple.     Lumbar back: Spasms and tenderness present.     Right lower leg: No edema.     Left lower leg: No edema.  Lymphadenopathy:      Cervical: No cervical adenopathy.  Skin:    General: Skin is warm and dry.     Coloration: Skin is not pale.     Findings: No erythema or rash.  Neurological:     General: No focal deficit present.     Mental Status: He is alert and oriented to person, place, and time.     Motor: No abnormal muscle tone.     Gait: Gait normal.     Deep Tendon Reflexes: Reflexes are normal and symmetric. Reflexes normal.  Psychiatric:        Behavior: Behavior normal.        Thought Content: Thought content normal.        Judgment: Judgment normal.      No results found for any visits on 11/08/22.  Last CBC Lab Results  Component Value Date   WBC 6.2 06/22/2021   HGB 15.5 06/22/2021   HCT 45.5 06/22/2021   MCV 93.4 06/22/2021   MCH 32.0 04/12/2014   RDW 12.7 06/22/2021   PLT 228.0 64/33/2951   Last metabolic panel Lab Results  Component Value Date   GLUCOSE 95 06/22/2021   NA 140 06/22/2021   K 4.0 06/22/2021   CL 102 06/22/2021   CO2 29 06/22/2021   BUN 13 06/22/2021   CREATININE 0.92 06/22/2021   GFRNONAA >90 04/12/2014   CALCIUM 9.4 06/22/2021   PHOS 4.0 08/26/2007   PROT 6.4 06/22/2021   ALBUMIN 4.3 06/22/2021   BILITOT 0.8 06/22/2021   ALKPHOS 64 06/22/2021   AST  18 06/22/2021   ALT 19 06/22/2021   Last lipids Lab Results  Component Value Date   CHOL 185 06/22/2021   HDL 37.00 (L) 06/22/2021   LDLCALC 131 (H) 06/22/2021   LDLDIRECT 159.5 12/09/2011   TRIG 89.0 06/22/2021   CHOLHDL 5 06/22/2021   Last hemoglobin A1c Lab Results  Component Value Date   HGBA1C 5.5 06/04/2013   Last thyroid functions Lab Results  Component Value Date   TSH 2.10 06/22/2021   Last vitamin D No results found for: "25OHVITD2", "25OHVITD3", "VD25OH" Last vitamin B12 and Folate Lab Results  Component Value Date   VELFYBOF75 102 06/04/2013      The ASCVD Risk score (Arnett DK, et al., 2019) failed to calculate for the following reasons:   The valid HDL cholesterol range is 20  to 100 mg/dL   The valid total cholesterol range is 130 to 320 mg/dL    Assessment & Plan:   Problem List Items Addressed This Visit       Unprioritized   Preventative health care - Primary    Ghm utd Check labs  See AVS       Relevant Orders   CBC with Differential/Platelet   Comprehensive metabolic panel   Lipid panel   PSA   TSH   Need for influenza vaccination   Relevant Orders   Flu Vaccine QUAD 6+ mos PF IM (Fluarix Quad PF) (Completed)   Morbid obesity ([place])    To start wegovy F/u 3 months      Relevant Medications   Semaglutide-Weight Management (WEGOVY) 0.25 MG/0.5ML SOAJ   Left hip pain    Check xray       Relevant Orders   DG Hip Unilat W OR W/O Pelvis 2-3 Views Left   Hyperlipidemia    Encourage heart healthy diet such as MIND or DASH diet, increase exercise, avoid trans fats, simple carbohydrates and processed foods, consider a krill or fish or flaxseed oil cap daily.        Relevant Orders   CBC with Differential/Platelet   Comprehensive metabolic panel   Lipid panel   PSA   TSH   Essential hypertension   Relevant Orders   EKG 12-Lead   CBC with Differential/Platelet   Comprehensive metabolic panel   Lipid panel   PSA   TSH   Dyspepsia   Relevant Medications   famotidine (PEPCID) 20 MG tablet   Blood in stool   Relevant Orders   Ambulatory referral to Gastroenterology    Return in about 6 months (around 05/09/2023) for hypertension, hyperlipidemia.    Ann Held, DO

## 2022-11-09 LAB — CBC WITH DIFFERENTIAL/PLATELET
Absolute Monocytes: 539 cells/uL (ref 200–950)
Basophils Absolute: 63 cells/uL (ref 0–200)
Basophils Relative: 0.9 %
Eosinophils Absolute: 280 cells/uL (ref 15–500)
Eosinophils Relative: 4 %
HCT: 41.9 % (ref 38.5–50.0)
Hemoglobin: 14.5 g/dL (ref 13.2–17.1)
Lymphs Abs: 2079 cells/uL (ref 850–3900)
MCH: 32.6 pg (ref 27.0–33.0)
MCHC: 34.6 g/dL (ref 32.0–36.0)
MCV: 94.2 fL (ref 80.0–100.0)
MPV: 9.9 fL (ref 7.5–12.5)
Monocytes Relative: 7.7 %
Neutro Abs: 4039 cells/uL (ref 1500–7800)
Neutrophils Relative %: 57.7 %
Platelets: 248 10*3/uL (ref 140–400)
RBC: 4.45 10*6/uL (ref 4.20–5.80)
RDW: 12.6 % (ref 11.0–15.0)
Total Lymphocyte: 29.7 %
WBC: 7 10*3/uL (ref 3.8–10.8)

## 2022-11-09 LAB — LIPID PANEL
Cholesterol: 226 mg/dL — ABNORMAL HIGH (ref <200)
HDL: 39 mg/dL — ABNORMAL LOW (ref >=40)
LDL Cholesterol (Calc): 162 mg/dL (calc) — ABNORMAL HIGH
Non-HDL Cholesterol (Calc): 187 mg/dL (calc) — ABNORMAL HIGH (ref <130)
Total CHOL/HDL Ratio: 5.8 (calc) — ABNORMAL HIGH (ref <5.0)
Triglycerides: 123 mg/dL (ref <150)

## 2022-11-09 LAB — COMPREHENSIVE METABOLIC PANEL
AG Ratio: 2 (calc) (ref 1.0–2.5)
ALT: 23 U/L (ref 9–46)
AST: 19 U/L (ref 10–35)
Albumin: 4.5 g/dL (ref 3.6–5.1)
Alkaline phosphatase (APISO): 64 U/L (ref 35–144)
BUN: 13 mg/dL (ref 7–25)
CO2: 23 mmol/L (ref 20–32)
Calcium: 9.5 mg/dL (ref 8.6–10.3)
Chloride: 106 mmol/L (ref 98–110)
Creat: 0.96 mg/dL (ref 0.70–1.30)
Globulin: 2.2 g/dL (calc) (ref 1.9–3.7)
Glucose, Bld: 96 mg/dL (ref 65–99)
Potassium: 4 mmol/L (ref 3.5–5.3)
Sodium: 140 mmol/L (ref 135–146)
Total Bilirubin: 1.1 mg/dL (ref 0.2–1.2)
Total Protein: 6.7 g/dL (ref 6.1–8.1)

## 2022-11-09 LAB — PSA: PSA: 1.56 ng/mL (ref <=4.00)

## 2022-11-09 LAB — TSH: TSH: 1.67 mIU/L (ref 0.40–4.50)

## 2022-11-10 ENCOUNTER — Other Ambulatory Visit: Payer: Self-pay | Admitting: Family Medicine

## 2022-11-10 DIAGNOSIS — M160 Bilateral primary osteoarthritis of hip: Secondary | ICD-10-CM

## 2022-11-10 NOTE — Addendum Note (Signed)
Addended by: Roma Schanz R on: 11/10/2022 12:39 PM   Modules accepted: Orders

## 2022-11-11 ENCOUNTER — Encounter: Payer: Self-pay | Admitting: Gastroenterology

## 2022-11-11 ENCOUNTER — Telehealth: Payer: Self-pay | Admitting: Family Medicine

## 2022-11-11 NOTE — Telephone Encounter (Signed)
Pt scheduled for Thursday.

## 2022-11-11 NOTE — Telephone Encounter (Signed)
Was a referral needed for Cardio? Please advise

## 2022-11-11 NOTE — Telephone Encounter (Addendum)
Left message on machine to call back  EKG was ordered on 11/08/22.  He can just come in for nurse visit only.  Please schedule when Lowne is in office please.

## 2022-11-11 NOTE — Telephone Encounter (Signed)
Patient following up on referral to cardiologist so he can get a EKG. Advised patient that I saw the other referrals but not the cardiologist. Please advise patient.

## 2022-11-11 NOTE — Addendum Note (Signed)
Addended by: Kem Boroughs D on: 11/11/2022 02:46 PM   Modules accepted: Orders

## 2022-11-12 ENCOUNTER — Other Ambulatory Visit: Payer: Self-pay

## 2022-11-12 MED ORDER — ROSUVASTATIN CALCIUM 10 MG PO TABS: 10.0000 mg | ORAL_TABLET | Freq: Every day | ORAL | 2 refills | Status: DC

## 2022-11-14 ENCOUNTER — Ambulatory Visit: Payer: BC Managed Care – PPO

## 2022-11-20 ENCOUNTER — Ambulatory Visit: Payer: BC Managed Care – PPO

## 2022-11-20 DIAGNOSIS — I10 Essential (primary) hypertension: Secondary | ICD-10-CM

## 2022-11-20 NOTE — Progress Notes (Signed)
Pt here for EKG  per Dr.Lowne   EKG was done DOD seen results, per Dr.Wendling   EKG looked good, Pt was inform of results stated understand.

## 2022-12-03 ENCOUNTER — Ambulatory Visit: Payer: BC Managed Care – PPO | Admitting: Gastroenterology

## 2022-12-03 ENCOUNTER — Encounter: Payer: Self-pay | Admitting: Gastroenterology

## 2022-12-03 VITALS — BP 104/70 | HR 64 | Ht 68.5 in | Wt 283.0 lb

## 2022-12-03 DIAGNOSIS — Z860100 Personal history of colon polyps, unspecified: Secondary | ICD-10-CM | POA: Insufficient documentation

## 2022-12-03 DIAGNOSIS — K625 Hemorrhage of anus and rectum: Secondary | ICD-10-CM | POA: Diagnosis not present

## 2022-12-03 MED ORDER — NA SULFATE-K SULFATE-MG SULF 17.5-3.13-1.6 GM/177ML PO SOLN: 1.0000 | Freq: Once | ORAL | 0 refills | Status: AC

## 2022-12-03 MED ORDER — HYDROCORTISONE ACETATE 25 MG RE SUPP: 25.0000 mg | Freq: Every day | RECTAL | 3 refills | Status: DC

## 2022-12-03 NOTE — Progress Notes (Signed)
12/03/2022 Shane Haney [id number] 01/29/1961   HISTORY OF PRESENT ILLNESS: This is a 62 year old male who is a patient of Dr. Lynne Leader.  He is known him for colonoscopy in August 2016.  At that time he was found to have grade 1 internal hemorrhoids, moderate diverticulosis, and a sessile polyp that was removed and was a tubular adenoma on pathology.  He is here today with complaints of rectal bleeding.  He says that he has intermittent rectal bleeding.  He says that sometimes he will have it for couple of days in a row and then he will not have it again for another couple of weeks.  It is bright red blood on the toilet paper and then sometimes streaked on the stool as well.  His bowel movements are fairly normal/regular.  Referred back here on this occasion by his PCP, Dr. Carollee Herter, for evaluation of blood in stool.   Past Medical History:  Diagnosis Date   Diverticulosis    Dysrhythmia    "irregular at one time" (07/21/2013)   Gastroenteritis 08/20/2017   H/O echocardiogram 2010   seen by Dr. Stanford Breed- told to return as needed    Hyperlipidemia    Hypertension    Dr, Stanford Breed told pt. in 2010, ? extra beats.   Neuromuscular disorder ([place])    spinal cord compression, carpal tunnel - R hand  . Left 3 fingers remains with some tingling.   Osteoarthritis    "knees and wrists" (07/21/2013)   Sleep apnea 10/02/2021   Snores    Past Surgical History:  Procedure Laterality Date   ANTERIOR CERVICAL DECOMP/DISCECTOMY FUSION N/A 07/08/2013   Procedure: ANTERIOR CERVICAL DECOMPRESSION/DISCECTOMY FUSION 2 LEVELS C3-C5;  Surgeon: Melina Schools, MD;  Location: [place];  Service: Orthopedics;  Laterality: N/A;   ANTERIOR CRUCIATE LIGAMENT REPAIR Left 10/14/1997   CARPAL TUNNEL RELEASE Left 05/14/2013   CARPAL TUNNEL RELEASE Right 07/08/2013   Procedure: LIMITED OPEN RIGHT CARPAL TUNNEL RELEASE;  Surgeon: Roseanne Kaufman, MD;  Location: [place];  Service: Orthopedics;  Laterality: Right;    INCISION AND DRAINAGE OF WOUND N/A 07/21/2013   Procedure: IRRIGATION AND DEBRIDEMENT OF CERVICAL WOUND;  Surgeon: Melina Schools, MD;  Location: [place];  Service: Orthopedics;  Laterality: N/A;   KNEE ARTHROSCOPY Right 10/14/2005   MICRODISCECTOMY LUMBAR  2023   OPEN REDUCTION INTERNAL FIXATION (ORIF) FOOT LISFRANC FRACTURE Right 10/15/1991   R- /w ORIF   TONSILLECTOMY     TOTAL KNEE ARTHROPLASTY Left 04/11/2014   Procedure: LEFT TOTAL KNEE ARTHROPLASTY WITH HARDWARE REMOVAL;  Surgeon: [name], MD;  [facility];  Service: Orthopedics;  Laterality: Left;    reports that he has never smoked. He has never used smokeless tobacco. He reports current alcohol use of about 4.0 standard drinks of alcohol per week. He reports that he does not use drugs. family history includes Alcohol abuse in his father; Aneurysm in his mother; HIV in his brother; Hypertension in his father and mother; Osteoarthritis in his brother, brother, and brother; Stroke in his father. No Known Allergies    Outpatient Encounter Medications as of 12/03/2022  Medication Sig   diclofenac (VOLTAREN) 75 MG EC tablet Take 1 tablet (75 mg total) by mouth 2 (two) times daily.   gabapentin (NEURONTIN) 300 MG capsule Take 1 capsule (300 mg total) by mouth 2 (two) times daily.   Multiple Vitamin (MULTIVITAMIN WITH MINERALS) TABS tablet Take 1 tablet by mouth daily.   Oxymetazoline HCl (NASAL SPRAY)  0.05 % SOLN Place into the nose.   valsartan (DIOVAN) 320 MG tablet TAKE 1 TABLET BY MOUTH DAILY   famotidine (PEPCID) 20 MG tablet Take 1 tablet (20 mg total) by mouth 2 (two) times daily. (Patient not taking: Reported on 12/03/2022)   rosuvastatin (CRESTOR) 10 MG tablet Take 1 tablet (10 mg total) by mouth daily. (Patient not taking: Reported on 12/03/2022)   [DISCONTINUED] Semaglutide-Weight Management (WEGOVY) 0.25 MG/0.5ML SOAJ Inject 0.25 mg into the skin once a week.   No facility-administered encounter medications on file as  of 12/03/2022.     REVIEW OF SYSTEMS  : All other systems reviewed and negative except where noted in the History of Present Illness.   PHYSICAL EXAM: BP 104/70 (BP Location: Left Arm, Patient Position: Sitting, Cuff Size: Large)   Pulse 64   Ht 5' 8.5" (1.74 m) Comment: height measured without shoes  Wt 283 lb (128.4 kg)   BMI 42.40 kg/m  General: Well developed white male in no acute distress Head: Normocephalic and atraumatic Eyes:  Sclerae anicteric, conjunctiva pink. Ears: Normal auditory acuity Lungs: Clear throughout to auscultation; no W/R/R. Heart: Regular rate and rhythm; no M/R/G. Abdomen: Soft, non-distended.  BS present.  Non-tender. Rectal:  Will be done at the time of colonoscopy. Musculoskeletal: Symmetrical with no gross deformities  Skin: No lesions on visible extremities Extremities: No edema  Neurological: Alert oriented x 4, grossly non-focal Psychological:  Alert and cooperative. Normal mood and affect  ASSESSMENT AND PLAN: *Personal history of adenomatous polyp:  Last colonoscopy 05/2015 with one adenomatous polyp removed.  Will schedule colonoscopy with Dr. Fuller Plan. *Rectal bleeding:  Possibly hemorrhoidal as he had internal hemorrhoids on previous colonoscopy.  Will try hydrocortisone suppositories for now but may need to consider hemorrhoid banding if bleeding persistent and colonoscopy ok otherwise.  Prescription sent to pharmacy.  **The risks, benefits, and alternatives to colonoscopy were discussed with the patient and he consents to proceed.   CC:  Ann Held, *

## 2022-12-03 NOTE — Patient Instructions (Addendum)
_______________________________________________________  If your blood pressure at your visit was 140/90 or greater, please contact your primary care physician to follow up on this.  _______________________________________________________  If you are age 60 or older, your body mass index should be between 23-30. Your Body mass index is 42.4 kg/m. If this is out of the aforementioned range listed, please consider follow up with your Primary Care Provider.  If you are age 5 or younger, your body mass index should be between 19-25. Your Body mass index is 42.4 kg/m. If this is out of the aformentioned range listed, please consider follow up with your Primary Care Provider.   ________________________________________________________  The [facility] providers would like to encourage you to use [facility] to communicate with providers for non-urgent requests or questions.  Due to long hold times on the telephone, sending your provider a message by [facility] may be a faster and more efficient way to get a response.  Please allow 48 business hours for a response.  Please remember that this is for non-urgent requests.  _______________________________________________________  We have sent the following medications to your pharmacy for you to pick up at your convenience: Hydrocortisone suppository   You have been scheduled for a colonoscopy. Please follow written instructions given to you at your visit today.  Please pick up your prep supplies at the pharmacy within the next 1-3 days. If you use inhalers (even only as needed), please bring them with you on the day of your procedure.

## 2022-12-10 ENCOUNTER — Encounter: Payer: BC Managed Care – PPO | Admitting: Family Medicine

## 2022-12-19 ENCOUNTER — Encounter: Payer: BC Managed Care – PPO | Admitting: Gastroenterology

## 2023-01-13 ENCOUNTER — Ambulatory Visit: Payer: BC Managed Care – PPO | Admitting: Family Medicine

## 2023-01-13 ENCOUNTER — Encounter: Payer: Self-pay | Admitting: Family Medicine

## 2023-01-13 VITALS — BP 138/84 | HR 60 | Temp 98.7°F | Ht 70.0 in | Wt 284.0 lb

## 2023-01-13 DIAGNOSIS — K645 Perianal venous thrombosis: Secondary | ICD-10-CM | POA: Diagnosis not present

## 2023-01-13 NOTE — Progress Notes (Signed)
Chief Complaint  Patient presents with   lump near rectum    Shane Haney is a 62 y.o. male here for a skin complaint.  Duration: 1 day Location: rectal area Pruritic? No Painful? No Drainage? No New soaps/lotions/topicals/detergents? No Other associated symptoms: no fevers, redness, bruising, swelling Therapies tried thus far: none  Past Medical History:  Diagnosis Date   Diverticulosis    Dysrhythmia    "irregular at one time" (07/21/2013)   Gastroenteritis 08/20/2017   H/O echocardiogram 2010   seen by Dr. Stanford Breed- told to return as needed    Hyperlipidemia    Hypertension    Dr, Stanford Breed told pt. in 2010, ? extra beats.   Neuromuscular disorder    spinal cord compression, carpal tunnel - R hand  . Left 3 fingers remains with some tingling.   Osteoarthritis    "knees and wrists" (07/21/2013)   Sleep apnea 10/02/2021   Snores     BP 138/84   Pulse 60   Temp 98.7 F (37.1 C) (Oral)   Ht 5\' 10"  (1.778 m)   Wt 284 lb (128.8 kg)   SpO2 97%   BMI 40.75 kg/m  Gen: awake, alert, appearing stated age Lungs: No accessory muscle use Skin: Over left anal region, there is a thrombosed hemorrhoid measuring approximately 1.4 x 0.7 cm.  Mild tenderness to palpation.  No fissures or active signs of bleeding. Psych: Age appropriate judgment and insight  Thrombosed external hemorrhoid  Okay to use over-the-counter topical steroids, he has suppositories at home that are medicated.  He only needs to use these if he starts having symptoms.  I do not think removing this thrombus today would be very beneficial given his relative lack of symptoms.  Stay hydrated.  He will let us know if anything changes. F/u prn. The patient voiced understanding and agreement to the plan.  Cherry Log, DO 01/13/23 10:04 AM

## 2023-01-13 NOTE — Patient Instructions (Signed)
Stay hydrated.  If you are having any symptoms from the hemorrhoid, Ok to use topical steroids and suppositories.   Make sure your bowels are flowing well so you don't strain.  Let us know if you need anything.

## 2023-02-04 ENCOUNTER — Other Ambulatory Visit: Payer: Self-pay | Admitting: *Deleted

## 2023-02-04 DIAGNOSIS — R1013 Epigastric pain: Secondary | ICD-10-CM

## 2023-02-04 MED ORDER — FAMOTIDINE 20 MG PO TABS: 20.0000 mg | ORAL_TABLET | Freq: Two times a day (BID) | ORAL | 2 refills | Status: DC

## 2023-02-07 ENCOUNTER — Other Ambulatory Visit: Payer: Self-pay | Admitting: Family Medicine

## 2023-02-13 ENCOUNTER — Telehealth: Payer: Self-pay

## 2023-02-13 NOTE — Transitions of Care (Post Inpatient/ED Visit) (Signed)
   02/13/2023  Name: Shane Haney MRN: [id number] DOB: [birth year]  Today's TOC FU Call Status: Today's TOC FU Call Status:: Successful TOC FU Call Competed TOC FU Call Complete Date: 02/13/23  Transition Care Management Follow-up Telephone Call Date of Discharge: 02/12/23 Discharge Facility: Other ([facility]) Name of Other ([facility]) Discharge Facility: [place] [facility] Type of Discharge: Inpatient Admission Primary Inpatient Discharge Diagnosis:: osteoarthritis left hip How have you been since you were released from the hospital?: Better Any questions or concerns?: No  Items Reviewed: Did you receive and understand the discharge instructions provided?: Yes Medications obtained,verified, and reconciled?: Yes (Medications Reviewed) Any new allergies since your discharge?: No Dietary orders reviewed?: Yes Do you have support at home?: Yes People in Home: child(ren), adult  Medications Reviewed Today: Medications Reviewed Today     Reviewed by Karena Addison, LPN (Licensed Practical Nurse) on 02/13/23 at [phone]  Med List Status: <None>   Medication Order Taking? Sig Documenting Provider Last Dose Status Informant  diclofenac (VOLTAREN) 75 MG EC tablet [id number] Yes Take 1 tablet (75 mg total) by mouth 2 (two) times daily. Donato Schultz, DO Taking Active   famotidine (PEPCID) 20 MG tablet [id number] Yes Take 1 tablet (20 mg total) by mouth 2 (two) times daily. Donato Schultz, DO Taking Active   gabapentin (NEURONTIN) 300 MG capsule [id number] Yes Take 1 capsule (300 mg total) by mouth 2 (two) times daily. Donato Schultz, DO Taking Active   hydrocortisone (ANUSOL-HC) 25 MG suppository [id number] Yes Place 1 suppository (25 mg total) rectally at bedtime. Zehr, Princella Pellegrini, PA-C Taking Active   Multiple Vitamin (MULTIVITAMIN WITH MINERALS) TABS tablet [id number] Yes Take 1 tablet by mouth daily. [provider] Taking Active Self  Oxymetazoline HCl (NASAL  SPRAY) 0.05 % SOLN [id number] Yes Place into the nose. [provider] Taking Active   rosuvastatin (CRESTOR) 10 MG tablet [id number] Yes TAKE 1 TABLET(10 MG) BY MOUTH DAILY Zola Button, Grayling Congress, DO Taking Active   valsartan (DIOVAN) 320 MG tablet [id number] Yes TAKE 1 TABLET BY MOUTH DAILY Donato Schultz, DO Taking Active             Home Care and Equipment/Supplies: Were Home Health Services Ordered?: Yes Name of Home Health Agency:: [facility] Has Agency set up a time to come to your home?: Yes First Home Health Visit Date: 02/13/23 Any new equipment or medical supplies ordered?: Yes Name of Medical supply agency?: [facility] Were you able to get the equipment/medical supplies?: Yes Do you have any questions related to the use of the equipment/supplies?: No  Functional Questionnaire: Do you need assistance with bathing/showering or dressing?: Yes Do you need assistance with meal preparation?: No Do you need assistance with eating?: No Do you have difficulty maintaining continence: No Do you need assistance with getting out of bed/getting out of a chair/moving?: Yes Do you have difficulty managing or taking your medications?: No  Follow up appointments reviewed: PCP Follow-up appointment confirmed?: NA Specialist Hospital Follow-up appointment confirmed?: No Reason Specialist Follow-Up Not Confirmed: Patient has Specialist Provider Number and will Call for Appointment Do you need transportation to your follow-up appointment?: No Do you understand care options if your condition(s) worsen?: Yes-patient verbalized understanding    SIGNATURE Karena Addison, LPN [facility] Nurse Health Advisor Direct Dial [phone]

## 2023-02-20 ENCOUNTER — Ambulatory Visit: Payer: BC Managed Care – PPO | Admitting: Physician Assistant

## 2023-03-21 ENCOUNTER — Telehealth: Payer: Self-pay | Admitting: Family Medicine

## 2023-03-21 NOTE — Telephone Encounter (Signed)
Shane Haney from [facility] has called stating she does still need a medical clearance to be sent over to their office.  P: [phone] F: [phone]

## 2023-03-21 NOTE — Telephone Encounter (Signed)
Ok pt scheduled for 6/18.

## 2023-03-31 ENCOUNTER — Encounter: Payer: Self-pay | Admitting: Family Medicine

## 2023-03-31 ENCOUNTER — Ambulatory Visit: Payer: BC Managed Care – PPO | Admitting: Family Medicine

## 2023-03-31 VITALS — BP 130/80 | HR 50 | Temp 98.1°F | Resp 18 | Ht 70.0 in | Wt 277.6 lb

## 2023-03-31 DIAGNOSIS — E785 Hyperlipidemia, unspecified: Secondary | ICD-10-CM | POA: Diagnosis not present

## 2023-03-31 DIAGNOSIS — T148XXA Other injury of unspecified body region, initial encounter: Secondary | ICD-10-CM

## 2023-03-31 DIAGNOSIS — I10 Essential (primary) hypertension: Secondary | ICD-10-CM

## 2023-03-31 DIAGNOSIS — Z01818 Encounter for other preprocedural examination: Secondary | ICD-10-CM

## 2023-03-31 DIAGNOSIS — L089 Local infection of the skin and subcutaneous tissue, unspecified: Secondary | ICD-10-CM | POA: Insufficient documentation

## 2023-03-31 MED ORDER — VALSARTAN 320 MG PO TABS: 320.0000 mg | ORAL_TABLET | Freq: Every day | ORAL | 1 refills | Status: DC

## 2023-03-31 MED ORDER — DOXYCYCLINE HYCLATE 100 MG PO TABS
100.0000 mg | ORAL_TABLET | Freq: Two times a day (BID) | ORAL | 0 refills | Status: DC
Start: 2023-03-31 — End: 2023-05-12

## 2023-03-31 NOTE — Assessment & Plan Note (Signed)
L hip replacement scar --- oozing Doxycycline 100 mg bid x 10 days

## 2023-03-31 NOTE — Progress Notes (Signed)
Established Patient Office Visit  Subjective   Patient ID: Shane Haney, male    DOB: 09-26-1961  Age: 62 y.o. MRN: [id number]  Chief Complaint  Patient presents with   surgical clearance    Right total knee replacement,     HPI Discussed the use of AI scribe software for clinical note transcription with the patient, who gave verbal consent to proceed.  History of Present Illness   The patient, with a history of left hip and knee arthritis, presents for a post-operative follow-up after a left hip replacement six weeks ago. He reports significant improvement in mobility and is now 'pain free.' He is 'moving around more' and 'better than I have in fifteen years.' He is scheduled for a knee replacement on July 17th.  He also has a history of back pain, which he initially thought was causing the hip pain. However, he now believes it was the reverse. He had a discectomy performed by a neurosurgeon, which provided relief for about a month before the symptoms returned.  The patient also reports a persistent issue with a part of the surgical incision on the hip that is still draining. He was previously put on a five-day course of Keflex for this issue, but it has not resolved.  He has been active, working out every day, and has lost a significant amount of weight. He did physical therapy initially after the surgery but has since transitioned to doing the exercises on his own at the local [employer].      Patient Active Problem List   Diagnosis Date Noted   Pre-operative clearance 03/31/2023   Wound infection 03/31/2023   Hx of colonic polyps 12/03/2022   Rectal bleeding 12/03/2022   Need for influenza vaccination 11/08/2022   Blood in stool 11/08/2022   Dyspepsia 11/08/2022   Left hip pain 11/08/2022   Wart of hand 05/09/2022   Pain in finger of right hand 05/09/2022   Sleep apnea 10/02/2021   Loud snoring 10/02/2021   Excessive daytime sleepiness 10/02/2021   Morbid obesity with body  mass index (BMI) of 45.0 to 49.9 in adult [facility]) 10/02/2021   Chronic intermittent hypoxia with obstructive sleep apnea 10/02/2021   Severe obstructive sleep apnea-hypopnea syndrome 10/02/2021   Nasal obstruction 10/02/2021   Allergies 06/22/2021   Snoring 05/29/2021   Morbid obesity (HCC) 05/29/2021   Conjunctivitis 08/20/2017   Gastroenteritis 08/20/2017   Onychomycosis 04/08/2017   Acute bacterial sinusitis 12/05/2015   Preventative health care 12/05/2014   S/P left TKA 04/11/2014   Obesity (BMI 30-39.9) 06/05/2013   Spinal stenosis in cervical region 06/05/2013   CTS (carpal tunnel syndrome) 06/05/2013   Myalgia and myositis 06/05/2013   Left knee pain 04/19/2013   DEGENERATIVE JOINT DISEASE 10/02/2009   Hyperlipidemia 01/24/2009   SINUSITIS- ACUTE-NOS 10/28/2007   DIVERTICULOSIS, COLON 08/28/2007   Essential hypertension 08/26/2007   HEMORRHOIDS, INTERNAL 08/26/2007   PARESTHESIA, HANDS 08/26/2007   Past Medical History:  Diagnosis Date   Diverticulosis    Dysrhythmia    "irregular at one time" (07/21/2013)   Gastroenteritis 08/20/2017   H/O echocardiogram 2010   seen by Dr. Jens Som- told to return as needed    Hyperlipidemia    Hypertension    Dr, Jens Som told pt. in 2010, ? extra beats.   Neuromuscular disorder (HCC)    spinal cord compression, carpal tunnel - R hand  . Left 3 fingers remains with some tingling.   Osteoarthritis    "knees and wrists" (07/21/2013)  Sleep apnea 10/02/2021   Snores    Past Surgical History:  Procedure Laterality Date   ANTERIOR CERVICAL DECOMP/DISCECTOMY FUSION N/A 07/08/2013   Procedure: ANTERIOR CERVICAL DECOMPRESSION/DISCECTOMY FUSION 2 LEVELS C3-C5;  Surgeon: Venita Lick, MD;  Location: [facility] OR;  Service: Orthopedics;  Laterality: N/A;   ANTERIOR CRUCIATE LIGAMENT REPAIR Left 10/14/1997   CARPAL TUNNEL RELEASE Left 05/14/2013   CARPAL TUNNEL RELEASE Right 07/08/2013   Procedure: LIMITED OPEN RIGHT CARPAL TUNNEL RELEASE;   Surgeon: Dominica Severin, MD;  Location: [facility] OR;  Service: Orthopedics;  Laterality: Right;   INCISION AND DRAINAGE OF WOUND N/A 07/21/2013   Procedure: IRRIGATION AND DEBRIDEMENT OF CERVICAL WOUND;  Surgeon: Venita Lick, MD;  Location: [facility] OR;  Service: Orthopedics;  Laterality: N/A;   KNEE ARTHROSCOPY Right 10/14/2005   MICRODISCECTOMY LUMBAR  2023   OPEN REDUCTION INTERNAL FIXATION (ORIF) FOOT LISFRANC FRACTURE Right 10/15/1991   R- /w ORIF   TONSILLECTOMY     TOTAL KNEE ARTHROPLASTY Left 04/11/2014   Procedure: LEFT TOTAL KNEE ARTHROPLASTY WITH HARDWARE REMOVAL;  Surgeon: Shelda Pal, MD;  Location: [facility] ORS;  Service: Orthopedics;  Laterality: Left;   Social History   Tobacco Use   Smoking status: Never   Smokeless tobacco: Never  Vaping Use   Vaping Use: Never used  Substance Use Topics   Alcohol use: Yes    Alcohol/week: 4.0 standard drinks of alcohol    Types: 2 Cans of beer, 2 Shots of liquor per week    Comment: 1 per day   Drug use: No   Social History   Socioeconomic History   Marital status: Married    Spouse name: Billi   Number of children: 2   Years of education: Not on file   Highest education level: Bachelor's degree (e.g., BA, AB, BS)  Occupational History   Occupation: Runner, broadcasting/film/video--- [employer]    Employer: [employer]  Tobacco Use   Smoking status: Never   Smokeless tobacco: Never  Vaping Use   Vaping Use: Never used  Substance and Sexual Activity   Alcohol use: Yes    Alcohol/week: 4.0 standard drinks of alcohol    Types: 2 Cans of beer, 2 Shots of liquor per week    Comment: 1 per day   Drug use: No   Sexual activity: Yes    Partners: Female  Other Topics Concern   Not on file  Social History Narrative   Lives with wife and 2 children   Ambidextrous, eats and writes r handed. Throws and kicks w left hand/foot   Caffeine: 2 cups of coffee a day, diet mtn dew before workout      Social Determinants of Health   Financial Resource Strain:  Not on file  Food Insecurity: Not on file  Transportation Needs: Not on file  Physical Activity: Not on file  Stress: Not on file  Social Connections: Not on file  Intimate Partner Violence: Not on file   Family Status  Relation Name Status   Mother  Deceased   Father  Deceased   Brother  Deceased   Brother  Alive   Brother  Alive   Brother  Alive   MGM  Deceased   MGF  Deceased   PGM  Deceased   PGF  Deceased   Daughter  Alive   Son  Alive   Neg Hx  (Not Specified)   Family History  Problem Relation Age of Onset   Hypertension Mother    Aneurysm Mother  Brain   Stroke Father    Hypertension Father    Alcohol abuse Father    HIV Brother        passed away from AIDS   Osteoarthritis Brother    Osteoarthritis Brother    Osteoarthritis Brother    Colon cancer Neg Hx    Colon polyps Neg Hx    No Known Allergies    Review of Systems  Constitutional:  Negative for fever and malaise/fatigue.  HENT:  Negative for congestion.   Eyes:  Negative for blurred vision.  Respiratory:  Negative for shortness of breath.   Cardiovascular:  Negative for chest pain, palpitations and leg swelling.  Gastrointestinal:  Negative for abdominal pain, blood in stool and nausea.  Genitourinary:  Negative for dysuria and frequency.  Musculoskeletal:  Positive for joint pain. Negative for falls.  Skin:  Negative for rash.  Neurological:  Negative for dizziness, loss of consciousness and headaches.  Endo/Heme/Allergies:  Negative for environmental allergies.  Psychiatric/Behavioral:  Negative for depression. The patient is not nervous/anxious.       Objective:     BP 130/80 (BP Location: Left Arm, Patient Position: Sitting, Cuff Size: Large)   Pulse (!) 50   Temp 98.1 F (36.7 C) (Oral)   Resp 18   Ht 5\' 10"  (1.778 m)   Wt 277 lb 9.6 oz (125.9 kg)   SpO2 98%   BMI 39.83 kg/m  BP Readings from Last 3 Encounters:  03/31/23 130/80  01/13/23 138/84  12/03/22 104/70   Wt  Readings from Last 3 Encounters:  03/31/23 277 lb 9.6 oz (125.9 kg)  01/13/23 284 lb (128.8 kg)  12/03/22 283 lb (128.4 kg)   SpO2 Readings from Last 3 Encounters:  03/31/23 98%  01/13/23 97%  11/08/22 96%      Physical Exam Vitals and nursing note reviewed.  Constitutional:      Appearance: He is well-developed.  HENT:     Head: Normocephalic and atraumatic.  Eyes:     Pupils: Pupils are equal, round, and reactive to light.  Neck:     Thyroid: No thyromegaly.  Cardiovascular:     Rate and Rhythm: Normal rate and regular rhythm.     Heart sounds: No murmur heard. Pulmonary:     Effort: Pulmonary effort is normal. No respiratory distress.     Breath sounds: Normal breath sounds. No wheezing or rales.  Chest:     Chest wall: No tenderness.  Abdominal:     General: Abdomen is flat.     Palpations: Abdomen is soft.  Musculoskeletal:        General: No tenderness.     Cervical back: Normal range of motion and neck supple.     Right hip: Normal range of motion. Normal strength.     Left hip: Normal range of motion. Normal strength.     Right foot: Bony tenderness present. No swelling.     Left foot: Bony tenderness present. No swelling.  Skin:    General: Skin is warm and dry.     Findings: Erythema present.     Comments: L hip replacement scar---  + some errythema and oozing serous fluid   Neurological:     Mental Status: He is alert and oriented to person, place, and time.  Psychiatric:        Behavior: Behavior normal.        Thought Content: Thought content normal.        Judgment: Judgment normal.  No results found for any visits on 03/31/23.  Last CBC Lab Results  Component Value Date   WBC 7.0 11/08/2022   HGB 14.5 11/08/2022   HCT 41.9 11/08/2022   MCV 94.2 11/08/2022   MCH 32.6 11/08/2022   RDW 12.6 11/08/2022   PLT 248 11/08/2022   Last metabolic panel Lab Results  Component Value Date   GLUCOSE 96 11/08/2022   NA 140 11/08/2022   K 4.0  11/08/2022   CL 106 11/08/2022   CO2 23 11/08/2022   BUN 13 11/08/2022   CREATININE 0.96 11/08/2022   GFRNONAA >90 04/12/2014   CALCIUM 9.5 11/08/2022   PHOS 4.0 08/26/2007   PROT 6.7 11/08/2022   ALBUMIN 4.3 06/22/2021   BILITOT 1.1 11/08/2022   ALKPHOS 64 06/22/2021   AST 19 11/08/2022   ALT 23 11/08/2022   Last lipids Lab Results  Component Value Date   CHOL 226 (H) 11/08/2022   HDL 39 (L) 11/08/2022   LDLCALC 162 (H) 11/08/2022   LDLDIRECT 159.5 12/09/2011   TRIG 123 11/08/2022   CHOLHDL 5.8 (H) 11/08/2022   Last hemoglobin A1c Lab Results  Component Value Date   HGBA1C 5.5 06/04/2013   Last thyroid functions Lab Results  Component Value Date   TSH 1.67 11/08/2022   Last vitamin D No results found for: "25OHVITD2", "25OHVITD3", "VD25OH" Last vitamin B12 and Folate Lab Results  Component Value Date   VITAMINB12 351 06/04/2013      The 10-year ASCVD risk score (Arnett DK, et al., 2019) is: 13.7%    Assessment & Plan:   Problem List Items Addressed This Visit       Unprioritized   Wound infection    L hip replacement scar --- oozing Doxycycline 100 mg bid x 10 days       Relevant Medications   doxycycline (VIBRA-TABS) 100 MG tablet   Pre-operative clearance - Primary    Pt cleared for R knee replacement  EKG--- no change from previous EKG  Sinus brady, Rbbb      Relevant Orders   EKG 12-Lead (Completed)   Hyperlipidemia    Encourage heart healthy diet such as MIND or DASH diet, increase exercise, avoid trans fats, simple carbohydrates and processed foods, consider a krill or fish or flaxseed oil cap daily.        Relevant Medications   valsartan (DIOVAN) 320 MG tablet   Essential hypertension    Well controlled, no changes to meds. Encouraged heart healthy diet such as the DASH diet and exercise as tolerated.        Relevant Medications   valsartan (DIOVAN) 320 MG tablet  Assessment and Plan    Postoperative Hip Replacement:  Significant improvement in mobility and pain. Persistent drainage from surgical site despite a course of Keflex. -Start Doxycycline for persistent drainage. -Inform surgeon of new antibiotic course during upcoming visit.  Upcoming Knee Replacement: Scheduled for July 17th with Dr. Thamas Jaegers. -Continue with preoperative preparations.  Chronic Back Pain: Improved since hip replacement. -No new interventions discussed at this time.  General Health Maintenance: -Continue with physical activity and weight loss efforts.        No follow-ups on file.    Donato Schultz, DO

## 2023-03-31 NOTE — Assessment & Plan Note (Signed)
Pt cleared for R knee replacement  EKG--- no change from previous EKG  Sinus brady, Rbbb

## 2023-03-31 NOTE — Assessment & Plan Note (Signed)
Well controlled, no changes to meds. Encouraged heart healthy diet such as the DASH diet and exercise as tolerated.  °

## 2023-03-31 NOTE — Assessment & Plan Note (Signed)
Encourage heart healthy diet such as MIND or DASH diet, increase exercise, avoid trans fats, simple carbohydrates and processed foods, consider a krill or fish or flaxseed oil cap daily.  °

## 2023-04-01 ENCOUNTER — Ambulatory Visit: Payer: BC Managed Care – PPO | Admitting: Family Medicine

## 2023-04-04 ENCOUNTER — Telehealth: Payer: Self-pay | Admitting: Family Medicine

## 2023-04-04 NOTE — Telephone Encounter (Signed)
[facility]) called stating pt had an EKG done with [place] for surgical clearance but they do not have it available to look at. They would like to have it faxed to the following number:  F: [phone]  [facility] also advised that pt is coming in for Pre-cert on 0.98.11 and they need it sent to them by then or they will have to do another EKG on the pt.

## 2023-04-04 NOTE — Telephone Encounter (Signed)
Pre cert and EKG faxed

## 2023-05-04 ENCOUNTER — Other Ambulatory Visit: Payer: Self-pay | Admitting: Family Medicine

## 2023-05-04 DIAGNOSIS — R1013 Epigastric pain: Secondary | ICD-10-CM

## 2023-05-05 ENCOUNTER — Other Ambulatory Visit: Payer: Self-pay | Admitting: Family Medicine

## 2023-05-09 ENCOUNTER — Ambulatory Visit: Payer: BC Managed Care – PPO | Admitting: Family Medicine

## 2023-05-12 ENCOUNTER — Ambulatory Visit: Payer: BC Managed Care – PPO | Admitting: Family Medicine

## 2023-05-12 ENCOUNTER — Encounter: Payer: Self-pay | Admitting: Family Medicine

## 2023-05-12 VITALS — BP 100/70 | HR 59 | Temp 98.6°F | Resp 18 | Ht 70.0 in | Wt 272.6 lb

## 2023-05-12 DIAGNOSIS — R2242 Localized swelling, mass and lump, left lower limb: Secondary | ICD-10-CM

## 2023-05-12 DIAGNOSIS — G8929 Other chronic pain: Secondary | ICD-10-CM

## 2023-05-12 DIAGNOSIS — E785 Hyperlipidemia, unspecified: Secondary | ICD-10-CM

## 2023-05-12 DIAGNOSIS — M5442 Lumbago with sciatica, left side: Secondary | ICD-10-CM | POA: Diagnosis not present

## 2023-05-12 DIAGNOSIS — I10 Essential (primary) hypertension: Secondary | ICD-10-CM | POA: Diagnosis not present

## 2023-05-12 LAB — LIPID PANEL
Cholesterol: 193 mg/dL (ref 0–200)
HDL: 48.2 mg/dL
LDL Cholesterol: 133 mg/dL — ABNORMAL HIGH (ref 0–99)
NonHDL: 144.87
Total CHOL/HDL Ratio: 4
Triglycerides: 57 mg/dL (ref 0.0–149.0)
VLDL: 11.4 mg/dL (ref 0.0–40.0)

## 2023-05-12 LAB — COMPREHENSIVE METABOLIC PANEL WITH GFR
ALT: 22 U/L (ref 0–53)
AST: 21 U/L (ref 0–37)
Albumin: 4.1 g/dL (ref 3.5–5.2)
Alkaline Phosphatase: 53 U/L (ref 39–117)
BUN: 21 mg/dL (ref 6–23)
CO2: 25 meq/L (ref 19–32)
Calcium: 8.9 mg/dL (ref 8.4–10.5)
Chloride: 106 meq/L (ref 96–112)
Creatinine, Ser: 0.94 mg/dL (ref 0.40–1.50)
GFR: 88.33 mL/min
Glucose, Bld: 112 mg/dL — ABNORMAL HIGH (ref 70–99)
Potassium: 4.5 meq/L (ref 3.5–5.1)
Sodium: 138 meq/L (ref 135–145)
Total Bilirubin: 0.7 mg/dL (ref 0.2–1.2)
Total Protein: 5.8 g/dL — ABNORMAL LOW (ref 6.0–8.3)

## 2023-05-12 MED ORDER — DICLOFENAC SODIUM 75 MG PO TBEC
75.0000 mg | DELAYED_RELEASE_TABLET | Freq: Two times a day (BID) | ORAL | 0 refills | Status: DC
Start: 2023-05-12 — End: 2023-05-27

## 2023-05-12 MED ORDER — GABAPENTIN 300 MG PO CAPS: 300.0000 mg | ORAL_CAPSULE | Freq: Two times a day (BID) | ORAL | 1 refills | Status: AC

## 2023-05-12 NOTE — Assessment & Plan Note (Signed)
Encourage heart healthy diet such as MIND or DASH diet, increase exercise, avoid trans fats, simple carbohydrates and processed foods, consider a krill or fish or flaxseed oil cap daily.   Pt is not taking statin

## 2023-05-12 NOTE — Assessment & Plan Note (Signed)
Well controlled, no changes to meds. Encouraged heart healthy diet such as the DASH diet and exercise as tolerated.  °

## 2023-05-12 NOTE — Progress Notes (Signed)
Established Patient Office Visit  Subjective   Patient ID: Shane Haney, male    DOB: 01-Feb-1961  Age: 62 y.o. MRN: [id number]  Chief Complaint  Patient presents with   Hypertension   Hyperlipidemia   Follow-up    HPI Discussed the use of AI scribe software for clinical note transcription with the patient, who gave verbal consent to proceed.  History of Present Illness   The patient, a Runner, broadcasting/film/video and umpire, presents with chronic right knee pain and a newly noticed swelling on his foot. He has a history of left knee replacement seven years ago and has been managing his right knee pain with diclofenac sodium and gabapentin. He has decided to postpone his right knee replacement surgery until his Christmas break to allow for a longer recovery period. He reports that his right knee has never been as bad as his left one was prior to replacement. He has been able to umpire again due to feeling good after his hip and right knee treatment. He also reports a history of multiple surgeries on his left knee due to injuries from college football, including an ACL tear. The right knee has had two scopes for minor torn cartilage and is now arthritic. He has noticed a swelling on his foot about a month ago, which has grown slightly larger and more mushy, but does not cause any pain.    ' Patient Active Problem List   Diagnosis Date Noted   Pre-operative clearance 03/31/2023   Wound infection 03/31/2023   Hx of colonic polyps 12/03/2022   Rectal bleeding 12/03/2022   Need for influenza vaccination 11/08/2022   Blood in stool 11/08/2022   Dyspepsia 11/08/2022   Left hip pain 11/08/2022   Wart of hand 05/09/2022   Pain in finger of right hand 05/09/2022   Sleep apnea 10/02/2021   Loud snoring 10/02/2021   Excessive daytime sleepiness 10/02/2021   Morbid obesity with body mass index (BMI) of 45.0 to 49.9 in adult [facility]) 10/02/2021   Chronic intermittent hypoxia with obstructive sleep apnea  10/02/2021   Severe obstructive sleep apnea-hypopnea syndrome 10/02/2021   Nasal obstruction 10/02/2021   Allergies 06/22/2021   Snoring 05/29/2021   Morbid obesity (HCC) 05/29/2021   Conjunctivitis 08/20/2017   Gastroenteritis 08/20/2017   Onychomycosis 04/08/2017   Acute bacterial sinusitis 12/05/2015   Preventative health care 12/05/2014   S/P left TKA 04/11/2014   Obesity (BMI 30-39.9) 06/05/2013   Spinal stenosis in cervical region 06/05/2013   CTS (carpal tunnel syndrome) 06/05/2013   Myalgia and myositis 06/05/2013   Left knee pain 04/19/2013   DEGENERATIVE JOINT DISEASE 10/02/2009   Hyperlipidemia 01/24/2009   SINUSITIS- ACUTE-NOS 10/28/2007   DIVERTICULOSIS, COLON 08/28/2007   Essential hypertension 08/26/2007   HEMORRHOIDS, INTERNAL 08/26/2007   PARESTHESIA, HANDS 08/26/2007   Past Medical History:  Diagnosis Date   Diverticulosis    Dysrhythmia    "irregular at one time" (07/21/2013)   Gastroenteritis 08/20/2017   H/O echocardiogram 2010   seen by Dr. Jens Som- told to return as needed    Hyperlipidemia    Hypertension    Dr, Jens Som told pt. in 2010, ? extra beats.   Neuromuscular disorder (HCC)    spinal cord compression, carpal tunnel - R hand  . Left 3 fingers remains with some tingling.   Osteoarthritis    "knees and wrists" (07/21/2013)   Sleep apnea 10/02/2021   Snores    Past Surgical History:  Procedure Laterality Date   ANTERIOR CERVICAL  DECOMP/DISCECTOMY FUSION N/A 07/08/2013   Procedure: ANTERIOR CERVICAL DECOMPRESSION/DISCECTOMY FUSION 2 LEVELS C3-C5;  Surgeon: Venita Lick, MD;  Location: [facility] OR;  Service: Orthopedics;  Laterality: N/A;   ANTERIOR CRUCIATE LIGAMENT REPAIR Left 10/14/1997   CARPAL TUNNEL RELEASE Left 05/14/2013   CARPAL TUNNEL RELEASE Right 07/08/2013   Procedure: LIMITED OPEN RIGHT CARPAL TUNNEL RELEASE;  Surgeon: Dominica Severin, MD;  Location: [facility] OR;  Service: Orthopedics;  Laterality: Right;   INCISION AND DRAINAGE OF WOUND  N/A 07/21/2013   Procedure: IRRIGATION AND DEBRIDEMENT OF CERVICAL WOUND;  Surgeon: Venita Lick, MD;  Location: [facility] OR;  Service: Orthopedics;  Laterality: N/A;   KNEE ARTHROSCOPY Right 10/14/2005   MICRODISCECTOMY LUMBAR  2023   OPEN REDUCTION INTERNAL FIXATION (ORIF) FOOT LISFRANC FRACTURE Right 10/15/1991   R- /w ORIF   TONSILLECTOMY     TOTAL KNEE ARTHROPLASTY Left 04/11/2014   Procedure: LEFT TOTAL KNEE ARTHROPLASTY WITH HARDWARE REMOVAL;  Surgeon: Shelda Pal, MD;  Location: [facility] ORS;  Service: Orthopedics;  Laterality: Left;   Social History   Tobacco Use   Smoking status: Never   Smokeless tobacco: Never  Vaping Use   Vaping status: Never Used  Substance Use Topics   Alcohol use: Yes    Alcohol/week: 4.0 standard drinks of alcohol    Types: 2 Cans of beer, 2 Shots of liquor per week    Comment: 1 per day   Drug use: No   Social History   Socioeconomic History   Marital status: Married    Spouse name: Billi   Number of children: 2   Years of education: Not on file   Highest education level: Bachelor's degree (e.g., BA, AB, BS)  Occupational History   Occupation: Runner, broadcasting/film/video--- [employer]    Employer: [employer]  Tobacco Use   Smoking status: Never   Smokeless tobacco: Never  Vaping Use   Vaping status: Never Used  Substance and Sexual Activity   Alcohol use: Yes    Alcohol/week: 4.0 standard drinks of alcohol    Types: 2 Cans of beer, 2 Shots of liquor per week    Comment: 1 per day   Drug use: No   Sexual activity: Yes    Partners: Female  Other Topics Concern   Not on file  Social History Narrative   Lives with wife and 2 children   Ambidextrous, eats and writes r handed. Throws and kicks w left hand/foot   Caffeine: 2 cups of coffee a day, diet mtn dew before workout      Social Determinants of Health   Financial Resource Strain: Not on file  Food Insecurity: Low Risk  (02/12/2023)   [facility]   Food vital sign     Within the past 12 months, you worried that your food would run out before you got money to buy more: Never true    Within the past 12 months, the food you bought just didn't last and you didn't have money to get more. : Never true  Transportation Needs: Not on file (02/12/2023)  Physical Activity: Not on file  Stress: Not on file  Social Connections: Unknown (02/26/2022)   [facility] from [facility]   Social Network    Social Network: Not on file  Intimate Partner Violence: Unknown (01/18/2022)   Received from [facility]   HITS    Physically Hurt: Not on file    Insult or Talk Down To: Not on file    Threaten  Physical Harm: Not on file    Scream or Curse: Not on file   Family Status  Relation Name Status   Mother  Deceased   Father  Deceased   Brother  Deceased   Brother  Alive   Brother  Alive   Brother  Alive   MGM  Deceased   MGF  Deceased   PGM  Deceased   PGF  Deceased   Daughter  Alive   Son  Alive   Neg Hx  (Not Specified)  No partnership data on file   Family History  Problem Relation Age of Onset   Hypertension Mother    Aneurysm Mother        Brain   Stroke Father    Hypertension Father    Alcohol abuse Father    HIV Brother        passed away from AIDS   Osteoarthritis Brother    Osteoarthritis Brother    Osteoarthritis Brother    Colon cancer Neg Hx    Colon polyps Neg Hx    No Known Allergies    Review of Systems  Constitutional:  Negative for fever and malaise/fatigue.  HENT:  Negative for congestion.   Eyes:  Negative for blurred vision.  Respiratory:  Negative for cough and shortness of breath.   Cardiovascular:  Negative for chest pain, palpitations and leg swelling.  Gastrointestinal:  Negative for abdominal pain, blood in stool, nausea and vomiting.  Genitourinary:  Negative for dysuria and frequency.  Musculoskeletal:  Negative for back pain and falls.  Skin:  Negative for rash.  Neurological:  Negative for dizziness, loss of  consciousness and headaches.  Endo/Heme/Allergies:  Negative for environmental allergies.  Psychiatric/Behavioral:  Negative for depression. The patient is not nervous/anxious.       Objective:     BP 100/70 (BP Location: Left Arm, Patient Position: Sitting, Cuff Size: Large)   Pulse (!) 59   Temp 98.6 F (37 C) (Oral)   Resp 18   Ht 5\' 10"  (1.778 m)   Wt 272 lb 9.6 oz (123.7 kg)   SpO2 97%   BMI 39.11 kg/m  BP Readings from Last 3 Encounters:  05/12/23 100/70  03/31/23 130/80  01/13/23 138/84   Wt Readings from Last 3 Encounters:  05/12/23 272 lb 9.6 oz (123.7 kg)  03/31/23 277 lb 9.6 oz (125.9 kg)  01/13/23 284 lb (128.8 kg)   SpO2 Readings from Last 3 Encounters:  05/12/23 97%  03/31/23 98%  01/13/23 97%      Physical Exam Vitals and nursing note reviewed.  Constitutional:      General: He is not in acute distress.    Appearance: Normal appearance. He is well-developed.  HENT:     Head: Normocephalic and atraumatic.  Eyes:     General: No scleral icterus.       Right eye: No discharge.        Left eye: No discharge.  Cardiovascular:     Rate and Rhythm: Normal rate and regular rhythm.     Heart sounds: No murmur heard. Pulmonary:     Effort: Pulmonary effort is normal. No respiratory distress.     Breath sounds: Normal breath sounds.  Musculoskeletal:        General: Swelling present. Normal range of motion.     Cervical back: Normal range of motion and neck supple.     Right lower leg: No edema.     Left lower leg: No edema.  Left ankle: Swelling present.       Legs:     Comments: + rubbery mass L lat mall---  non tender , ?lipoma   Skin:    General: Skin is warm and dry.  Neurological:     Mental Status: He is alert and oriented to person, place, and time.  Psychiatric:        Mood and Affect: Mood normal.        Behavior: Behavior normal.        Thought Content: Thought content normal.        Judgment: Judgment normal.      No  results found for any visits on 05/12/23.  Last CBC Lab Results  Component Value Date   WBC 7.0 11/08/2022   HGB 14.5 11/08/2022   HCT 41.9 11/08/2022   MCV 94.2 11/08/2022   MCH 32.6 11/08/2022   RDW 12.6 11/08/2022   PLT 248 11/08/2022   Last metabolic panel Lab Results  Component Value Date   GLUCOSE 96 11/08/2022   NA 140 11/08/2022   K 4.0 11/08/2022   CL 106 11/08/2022   CO2 23 11/08/2022   BUN 13 11/08/2022   CREATININE 0.96 11/08/2022   GFR 91.85 06/22/2021   CALCIUM 9.5 11/08/2022   PHOS 4.0 08/26/2007   PROT 6.7 11/08/2022   ALBUMIN 4.3 06/22/2021   BILITOT 1.1 11/08/2022   ALKPHOS 64 06/22/2021   AST 19 11/08/2022   ALT 23 11/08/2022   Last lipids Lab Results  Component Value Date   CHOL 226 (H) 11/08/2022   HDL 39 (L) 11/08/2022   LDLCALC 162 (H) 11/08/2022   LDLDIRECT 159.5 12/09/2011   TRIG 123 11/08/2022   CHOLHDL 5.8 (H) 11/08/2022   Last hemoglobin A1c Lab Results  Component Value Date   HGBA1C 5.5 06/04/2013   Last thyroid functions Lab Results  Component Value Date   TSH 1.67 11/08/2022   Last vitamin D No results found for: "25OHVITD2", "25OHVITD3", "VD25OH" Last vitamin B12 and Folate Lab Results  Component Value Date   VITAMINB12 351 06/04/2013      The 10-year ASCVD risk score (Arnett DK, et al., 2019) is: 8.8%    Assessment & Plan:   Problem List Items Addressed This Visit       Unprioritized   Hyperlipidemia    Encourage heart healthy diet such as MIND or DASH diet, increase exercise, avoid trans fats, simple carbohydrates and processed foods, consider a krill or fish or flaxseed oil cap daily.   Pt is not taking statin      Relevant Orders   Comprehensive metabolic panel   Lipid panel   Lipoprotein A (LPA)   Essential hypertension - Primary    Well controlled, no changes to meds. Encouraged heart healthy diet such as the DASH diet and exercise as tolerated.        Relevant Orders   Comprehensive metabolic  panel   Lipid panel   Lipoprotein A (LPA)   Other Visit Diagnoses     Chronic midline low back pain with left-sided sciatica       Relevant Medications   gabapentin (NEURONTIN) 300 MG capsule   diclofenac (VOLTAREN) 75 MG EC tablet   Mass of left ankle       Relevant Orders   Ambulatory referral to Sports Medicine       Return in about 6 months (around 11/12/2023), or if symptoms worsen or fail to improve, for annual exam, fasting.    Myrene Buddy  R Zola Button, DO

## 2023-05-12 NOTE — Patient Instructions (Signed)

## 2023-05-26 ENCOUNTER — Other Ambulatory Visit: Payer: Self-pay | Admitting: Family Medicine

## 2023-05-26 DIAGNOSIS — G8929 Other chronic pain: Secondary | ICD-10-CM

## 2023-05-27 ENCOUNTER — Other Ambulatory Visit: Payer: Self-pay

## 2023-05-27 ENCOUNTER — Ambulatory Visit: Payer: BC Managed Care – PPO | Admitting: Sports Medicine

## 2023-05-27 VITALS — BP 132/82 | Ht 70.0 in | Wt 255.0 lb

## 2023-05-27 DIAGNOSIS — M25472 Effusion, left ankle: Secondary | ICD-10-CM | POA: Diagnosis not present

## 2023-05-27 DIAGNOSIS — M67472 Ganglion, left ankle and foot: Secondary | ICD-10-CM | POA: Diagnosis not present

## 2023-05-27 NOTE — Assessment & Plan Note (Addendum)
Since this is nonpainful I suggested we try compression Keep up ankle motion over the next couple months If it becomes painful we could do a steroid injection or try drainage of the cyst However because is multiloculated we would need to do probably 3 injections to get to the different components of the cysts  He will try a body helix compression sleeve Ankle motion exercises as directed If no response after 2 months should return for more definitive treatment  Lateral wedges were added to both shoes to reduce his supination He is doubling insoles on the right foot to make up for the leg length difference

## 2023-05-27 NOTE — Progress Notes (Signed)
Chief complaint left ankle mass  This 62 year old man is a new patient to me He is a physical Press photographer at [employer] He is very active with exercising every day He has had a left knee and hip replacement He knows that this change his gait somewhat He is awaiting a right knee replacement and that is his biggest limitation with walking  Over the last 2 months though he has developed some swelling and finally a mass on the lateral aspect of his left ankle It is not tender or red It does not seem to limit the activities he is doing but he was concerned  Physical exam Obese white male who is in no acute distress and is muscular BP 132/82   Ht 5\' 10"  (1.778 m)   Wt 255 lb (115.7 kg)   BMI 36.59 kg/m   Ankle: There is visible swelling just anterior to the left ankle Range of motion is full in all directions. Strength is 5/5 in all directions. Stable lateral and medial ligaments; squeeze test and kleiger test unremarkable; Talar dome nontender; No pain at base of 5th MT; No tenderness over cuboid; No tenderness over N spot or navicular prominence No tenderness on posterior aspects of lateral and medial malleolus No sign of peroneal tendon subluxations; Negative tarsal tunnel tinel's Able to walk 4 steps. The palpable mass is freely movable and extends around his lateral malleolus along the anterior border  Walking gait reveals a significant Trendelenburg to the right side where he has a lot a genu varus He winds up supinating on the outside of both feet with a side-to-side wobble His right leg measures 2 cm shorter than the left  Ultrasound of the left ankle The joint itself does not show spurring or abnormality However tracing the peroneus tertius tendon downward there is an extremely large area of hypoechoic swelling On transfers view this appears like 3 loculated areas of ganglion cysts On longitudinal scan the largest of these cysts is just below the  anterior tip of the lateral malleolus Other structures were not remarkable  Impression: Ganglion cyst of left ankle probably related to the peroneus tertius tendon  Ultrasound and interpretation by Sibyl Parr. Darrick Penna, MD

## 2023-06-08 ENCOUNTER — Other Ambulatory Visit: Payer: Self-pay | Admitting: Family Medicine

## 2023-07-23 ENCOUNTER — Ambulatory Visit: Payer: BC Managed Care – PPO | Admitting: Sports Medicine

## 2023-07-30 ENCOUNTER — Ambulatory Visit: Payer: BC Managed Care – PPO | Admitting: Sports Medicine

## 2023-07-31 ENCOUNTER — Ambulatory Visit: Payer: BC Managed Care – PPO | Admitting: Adult Health

## 2023-09-01 ENCOUNTER — Telehealth: Payer: Self-pay | Admitting: Adult Health

## 2023-09-01 NOTE — Telephone Encounter (Signed)
..   Pt understands that although there may be some limitations with this type of visit, we will take all precautions to reduce any security or privacy concerns.  Pt understands that this will be treated like an in office visit and we will file with pt's insurance, and there may be a patient responsible charge related to this service. ? ?

## 2023-09-02 ENCOUNTER — Ambulatory Visit: Payer: BC Managed Care – PPO | Admitting: Adult Health

## 2023-09-16 ENCOUNTER — Ambulatory Visit: Payer: BC Managed Care – PPO | Admitting: Sports Medicine

## 2023-09-17 ENCOUNTER — Ambulatory Visit: Payer: BC Managed Care – PPO | Admitting: Sports Medicine

## 2023-09-18 ENCOUNTER — Ambulatory Visit: Payer: BC Managed Care – PPO | Admitting: Sports Medicine

## 2023-10-16 ENCOUNTER — Telehealth (INDEPENDENT_AMBULATORY_CARE_PROVIDER_SITE_OTHER): Payer: 59 | Admitting: Adult Health

## 2023-10-16 ENCOUNTER — Encounter: Payer: Self-pay | Admitting: Adult Health

## 2023-10-16 VITALS — Wt 252.0 lb

## 2023-10-16 DIAGNOSIS — G4733 Obstructive sleep apnea (adult) (pediatric): Secondary | ICD-10-CM | POA: Diagnosis not present

## 2023-10-16 NOTE — Progress Notes (Signed)
 [facility] [address] [place]. [place] [address] [phone]       OFFICE FOLLOW UP NOTE  Mr. KADRIAN PARTCH Date of Birth:  1961-05-04 Medical Record Number:  [id number]    Primary neurologist: Dr. Chalice Reason for visit: CPAP follow-up  Virtual Visit via Video Note  I connected with JACHAI OKAZAKI on 10/16/23 at  2:15 PM EST by a video enabled telemedicine application and verified that I am speaking with the correct person using two identifiers.  Location: Patient: in car (parked) Provider: in office   I discussed the limitations of evaluation and management by telemedicine and the availability of in person appointments. The patient expressed understanding and agreed to proceed.    SUBJECTIVE:   Follow-up visit:   Prior visit: 07/30/2022  Brief HPI:   DAEGEN BERROCAL is a 63 y.o. male who is followed for OSA on CPAP. Completed split-night study 09/30/2021 which showed total AHI of 103.3/hr. per Dr. Chalice  he did not respond completely to CPAP, but was not switched to BiPAP in the study. Although BiPAP cannot solve his problem when he enters REM sleep.  Recommended initiation of AutoPAP and referred to ENT to improve nasal airflow. Received CPAP machine 10/04/2021. Eval by ENT 10/12/2021 with concern of nasal obstruction and recommended bilateral inferior turbinate submucosal resection which was performed on 11/02/2021.   At prior visit, CPAP compliance report showed excellent usage and optimal residual AHI.  Continue to tolerate CPAP well.  ESS 2/24 (prior to therapy 11/24).  Patient difficulty obtaining supplies through [facility] therefore was transitioned to [facility].    Interval history:  Patient is being seen for CPAP compliance visit.  Compliance report as below showing excellent usage and optimal residual AHI.  He reports great improvement in benefit with CPAP use, he is unable to sleep without it.  ESS 0/24.  He has  actually lost a great amount of weight since prior visit, currently at 252 lbs (prior visit 308 lbs and sleep study weight 323 lbs)!! He continues to strive for further weight loss, goal weight would be around 225lbs.  He would be interested in repeating a sleep study once at goal weight. Routinely followed by [facility] and up to date on supplies.        ROS:   14 system review of systems performed and negative with exception of those listed in HPI  PMH:  Past Medical History:  Diagnosis Date   Diverticulosis    Dysrhythmia    irregular at one time (07/21/2013)   Gastroenteritis 08/20/2017   H/O echocardiogram 2010   seen by Dr. Pietro- told to return as needed    Hyperlipidemia    Hypertension    Dr, Pietro told pt. in 2010, ? extra beats.   Neuromuscular disorder (HCC)    spinal cord compression, carpal tunnel - R hand  . Left 3 fingers remains with some tingling.   Osteoarthritis    knees and wrists (07/21/2013)   Sleep apnea 10/02/2021   Snores     PSH:  Past Surgical History:  Procedure Laterality Date   ANTERIOR CERVICAL DECOMP/DISCECTOMY FUSION N/A 07/08/2013   Procedure: ANTERIOR CERVICAL DECOMPRESSION/DISCECTOMY FUSION 2 LEVELS C3-C5;  Surgeon: Donaciano Sprang, MD;  Location: [facility] OR;  Service: Orthopedics;  Laterality: N/A;   ANTERIOR CRUCIATE LIGAMENT REPAIR Left 10/14/1997   CARPAL TUNNEL RELEASE Left 05/14/2013   CARPAL TUNNEL RELEASE Right 07/08/2013   Procedure: LIMITED OPEN RIGHT CARPAL TUNNEL RELEASE;  Surgeon:  Elsie Mussel, MD;  Location: [facility] OR;  Service: Orthopedics;  Laterality: Right;   INCISION AND DRAINAGE OF WOUND N/A 07/21/2013   Procedure: IRRIGATION AND DEBRIDEMENT OF CERVICAL WOUND;  Surgeon: Donaciano Sprang, MD;  Location: [facility] OR;  Service: Orthopedics;  Laterality: N/A;   KNEE ARTHROSCOPY Right 10/14/2005   MICRODISCECTOMY LUMBAR  2023   OPEN REDUCTION INTERNAL FIXATION (ORIF) FOOT LISFRANC FRACTURE Right 10/15/1991   R- /w ORIF    TONSILLECTOMY     TOTAL KNEE ARTHROPLASTY Left 04/11/2014   Procedure: LEFT TOTAL KNEE ARTHROPLASTY WITH HARDWARE REMOVAL;  Surgeon: Donnice JONETTA Car, MD;  Location: [facility] ORS;  Service: Orthopedics;  Laterality: Left;    Social History:  Social History   Socioeconomic History   Marital status: Married    Spouse name: Billi   Number of children: 2   Years of education: Not on file   Highest education level: Bachelor's degree (e.g., BA, AB, BS)  Occupational History   Occupation: runner, broadcasting/film/video--- [employer]    Employer: [employer]  Tobacco Use   Smoking status: Never   Smokeless tobacco: Never  Vaping Use   Vaping status: Never Used  Substance and Sexual Activity   Alcohol use: Yes    Alcohol/week: 4.0 standard drinks of alcohol    Types: 2 Cans of beer, 2 Shots of liquor per week    Comment: 1 per day   Drug use: No   Sexual activity: Yes    Partners: Female  Other Topics Concern   Not on file  Social History Narrative   Lives with wife and 2 children   Ambidextrous, eats and writes r handed. Throws and kicks w left hand/foot   Caffeine: 2 cups of coffee a day, diet mtn dew before workout      Social Drivers of Health   Financial Resource Strain: Not on file  Food Insecurity: Low Risk  (02/12/2023)   [facility]   Hunger Vital Sign    Worried About Running Out of Food in the Last Year: Never true    Ran Out of Food in the Last Year: Never true  Transportation Needs: Not on file (02/12/2023)  Physical Activity: Not on file  Stress: Not on file  Social Connections: Unknown (02/26/2022)   [facility]   Social Network    Social Network: Not on file  Intimate Partner Violence: Unknown (01/18/2022)   Received from [facility]   HITS    Physically Hurt: Not on file    Insult or Talk Down To: Not on file    Threaten Physical Harm: Not on file    Scream or Curse: Not on file    Family History:   Family History  Problem Relation Age of Onset   Hypertension Mother    Aneurysm Mother        Brain   Stroke Father    Hypertension Father    Alcohol abuse Father    HIV Brother        passed away from AIDS   Osteoarthritis Brother    Osteoarthritis Brother    Osteoarthritis Brother    Colon cancer Neg Hx    Colon polyps Neg Hx     Medications:   Current Outpatient Medications on File Prior to Visit  Medication Sig Dispense Refill   diclofenac  (VOLTAREN ) 75 MG EC tablet TAKE 1 TABLET(75 MG) BY MOUTH TWICE DAILY 60 tablet 2   gabapentin  (NEURONTIN ) 300  MG capsule Take 1 capsule (300 mg total) by mouth 2 (two) times daily. 180 capsule 1   hydrocortisone  (ANUSOL -HC) 25 MG suppository Place 1 suppository (25 mg total) rectally at bedtime. 7 suppository 3   Multiple Vitamin (MULTIVITAMIN WITH MINERALS) TABS tablet Take 1 tablet by mouth daily.     Oxymetazoline HCl (NASAL SPRAY) 0.05 % SOLN Place into the nose.     rosuvastatin  (CRESTOR ) 10 MG tablet Take 1 tablet (10 mg total) by mouth daily. 90 tablet 1   valsartan  (DIOVAN ) 320 MG tablet Take 1 tablet (320 mg total) by mouth daily. 90 tablet 1   No current facility-administered medications on file prior to visit.    Allergies:  No Known Allergies    OBJECTIVE:  Physical Exam Today's Vitals   10/16/23 1413  Weight: 252 lb (114.3 kg)   Body mass index is 36.16 kg/m. (Prior BMI 44.19)   General: very pleasant middle-age Caucasian male, seated, in no evident distress  Neurologic Exam Mental Status: Awake and fully alert. Oriented to place and time. Recent and remote memory intact. Attention span, concentration and fund of knowledge appropriate. Mood and affect appropriate.         ASSESSMENT/PLAN: SANAD FEARNOW is a 63 y.o. year old male with diagnosis of severe sleep apnea with total AHI 103.3/h per split-night study 09/30/2021. Diagnosed with inferior turbinate hypertrophy s/p bilateral inferior turbinate  submucosal resection 11/02/2021.      Severe OSA: Compliance report shows excellent usage and optimal residual AHI.  Continue current pressure settings at 5-20 with EPR level 2. Congratulated on weight loss and highly encouraged he continue to do what he has been doing. Can consider repeat sleep study once at goal weight (about 25lbs to go) which would be able 100lb weight loss since initial sleep study!  Discussed importance of continued nightly usage and ensuring greater than 4 hours per night for optimal benefit and for insurance requirements.  Continue to follow with DME [facility] for any needed supplies or CPAP related concerns.    Follow up in 1 year via MyChart VV or call earlier if needed   CC:  PCP: Antonio Cyndee Jamee JONELLE, DO    I spent 20 minutes of face-to-face and non-face-to-face time with patient via MyChart video visit.  This included previsit chart review, lab review, study review, order entry, electronic health record documentation, patient education regarding above diagnoses and treatment plan and answered all the questions to patient's satisfaction  Harlene Bogaert, [facility]  [facility] [address] [place], [place] [address]  Phone [phone] Fax [phone] Note: This document was prepared with digital dictation and possible smart phrase technology. Any transcriptional errors that result from this process are unintentional.

## 2023-10-22 ENCOUNTER — Telehealth: Payer: Self-pay

## 2023-11-28 ENCOUNTER — Telehealth: Payer: Self-pay | Admitting: Adult Health

## 2023-11-28 NOTE — Telephone Encounter (Signed)
Pt has called to report that for the last 2-3 months the CPAP has not been acting right.  Pt states he contacted [facility] and they told him to call to see if he would qualify for a new one, their records only go back to 2022, please call pt.

## 2023-12-01 NOTE — Telephone Encounter (Signed)
Sent message to [facility] regarding this.

## 2023-12-02 NOTE — Telephone Encounter (Signed)
Message sent to [employer]     Called pt to discuss, pt states the issue has been resolved. Pt has new cpap machine.

## 2023-12-02 NOTE — Telephone Encounter (Signed)
Per [facility] records and messages, Per notes on this patients account, his pap unit was switched out on 11-28-23 @ 222pm.   See Note:   Patient walked in for a t/s on his cpap machine. Patients machine was switched out.  Old SN# [id number] DN# [phone]  New SN# [id number] DN# [phone]  A handwritten delivery ticket was signed.

## 2024-03-03 ENCOUNTER — Other Ambulatory Visit: Payer: Self-pay | Admitting: Family Medicine

## 2024-03-03 DIAGNOSIS — I10 Essential (primary) hypertension: Secondary | ICD-10-CM

## 2024-03-03 MED ORDER — VALSARTAN 320 MG PO TABS: 320.0000 mg | ORAL_TABLET | Freq: Every day | ORAL | 0 refills | Status: DC

## 2024-03-03 NOTE — Telephone Encounter (Signed)
 Copied from CRM [id number]. Topic: Clinical - Medication Refill >> Mar 03, 2024  1:38 PM Chuck Crater wrote: Medication: valsartan  (DIOVAN ) 320 MG tablet   Has the patient contacted their pharmacy? Yes (Agent: If no, request that the patient contact the pharmacy for the refill. If patient does not wish to contact the pharmacy document the reason why and proceed with request.) (Agent: If yes, when and what did the pharmacy advise?) advised they have not been able to reach doctor  This is the patient's preferred pharmacy:  [id number] [place] - [address] AT [facility] [address] [place] [address] Phone: [phone] Fax: [phone]  Is this the correct pharmacy for this prescription? Yes If no, delete pharmacy and type the correct one.   Has the prescription been filled recently? No 3 month supply  Is the patient out of the medication? Yes  Has the patient been seen for an appointment in the last year OR does the patient have an upcoming appointment? Yes  Can we respond through MyChart? Yes  Agent: Please be advised that Rx refills may take up to 3 business days. We ask that you follow-up with your pharmacy.

## 2024-03-31 ENCOUNTER — Telehealth: Payer: Self-pay | Admitting: Family Medicine

## 2024-03-31 DIAGNOSIS — I10 Essential (primary) hypertension: Secondary | ICD-10-CM

## 2024-04-05 NOTE — Telephone Encounter (Signed)
 Copied from CRM [id number]. Topic: Clinical - Medication Refill >> Apr 05, 2024  9:05 AM [name] wrote: Medication:  valsartan  (DIOVAN ) 320 MG tablet   Has the patient contacted their pharmacy? Yes (Agent: If no, request that the patient contact the pharmacy for the refill. If patient does not wish to contact the pharmacy document the reason why and proceed with request.) (Agent: If yes, when and what did the pharmacy advise?)  This is the patient's preferred pharmacy:  [facility] #[id number] [place] - [address] AT [facility] [address] [place] [address] Phone: [phone] Fax: [phone]  Is this the correct pharmacy for this prescription? Yes If no, delete pharmacy and type the correct one.   Has the prescription been filled recently? No  Is the patient out of the medication? Yes  Has the patient been seen for an appointment in the last year OR does the patient have an upcoming appointment? Yes  Can we respond through MyChart? Yes  Agent: Please be advised that Rx refills may take up to 3 business days. We ask that you follow-up with your pharmacy.

## 2024-04-08 MED ORDER — VALSARTAN 320 MG PO TABS
320.0000 mg | ORAL_TABLET | Freq: Every day | ORAL | 0 refills | Status: DC
Start: 2024-04-08 — End: 2024-05-03

## 2024-04-08 NOTE — Telephone Encounter (Signed)
 Rx sent.

## 2024-04-08 NOTE — Telephone Encounter (Unsigned)
 Copied from CRM [id number]. Topic: Appointments - Scheduling Inquiry for Clinic >> Apr 08, 2024 11:46 AM Adelita E wrote: Reason for CRM: Patient called in questioning if he will need to come in for an OV and see PCP in order to get refills of his valsartan  (DIOVAN ) 320 MG tablet, stated he has not had to do this in the past. Callback number [phone].

## 2024-04-08 NOTE — Addendum Note (Signed)
 Addended by: ELOUISE [name] HERO on: 04/08/2024 01:58 PM   Modules accepted: Orders

## 2024-04-20 ENCOUNTER — Ambulatory Visit (INDEPENDENT_AMBULATORY_CARE_PROVIDER_SITE_OTHER): Payer: Self-pay | Admitting: Family Medicine

## 2024-04-20 ENCOUNTER — Encounter: Payer: Self-pay | Admitting: Family Medicine

## 2024-04-20 VITALS — BP 104/80 | HR 72 | Temp 98.4°F | Resp 18 | Ht 70.0 in | Wt 272.4 lb

## 2024-04-20 DIAGNOSIS — B354 Tinea corporis: Secondary | ICD-10-CM

## 2024-04-20 DIAGNOSIS — J0140 Acute pansinusitis, unspecified: Secondary | ICD-10-CM

## 2024-04-20 MED ORDER — CLOTRIMAZOLE-BETAMETHASONE 1-0.05 % EX CREA: 1.0000 | TOPICAL_CREAM | Freq: Every day | CUTANEOUS | 0 refills | Status: DC

## 2024-04-20 MED ORDER — FLUTICASONE PROPIONATE 50 MCG/ACT NA SUSP: 2.0000 | Freq: Every day | NASAL | 6 refills | Status: DC

## 2024-04-20 MED ORDER — AMOXICILLIN-POT CLAVULANATE 875-125 MG PO TABS: 1.0000 | ORAL_TABLET | Freq: Two times a day (BID) | ORAL | 0 refills | Status: DC

## 2024-04-20 NOTE — Progress Notes (Signed)
 +  Established Patient Office Visit  Subjective   Patient ID: Shane Haney, male    DOB: 1961/09/26  Age: 63 y.o. MRN: [id number]  Chief Complaint  Patient presents with   Rash    X2 weeks, rash on his face around his mouth. Thought it might be from cpap machine.     HPI Discussed the use of AI scribe software for clinical note transcription with the patient, who gave verbal consent to proceed.  History of Present Illness Shane Haney is a 63 year old male who presents with a rash and itching under his CPAP mask.  He developed a rash and itching under his CPAP mask a couple of weeks ago while in [place] . The area is breaking out and itching, and his sister-in-law, a nurse, suggested it might resemble a yeast infection. He has been applying Neosporin cream, Neosporin ointment, and cortisone cream without significant improvement. He has had the CPAP for two years without prior issues. Recently, he noticed increased moisture in the mask, possibly due to drooling during sleep. He has not used the CPAP for about a week, hoping the rash would clear, but it persists, though last night was slightly better.  He also experiences symptoms that began during his trip to [place] , including drainage, an upset stomach, achiness, and a sore throat. He uses saline spray and has noticed colored nasal discharge when blowing his nose. He occasionally coughs at night but has no fever. He is currently on amoxicillin  250 mg twice a day, prescribed after a knee surgery infection, and hopes it might help with the sinus symptoms.  He has a history of right knee replacement in December, followed by an infection in the incision two weeks post-surgery, requiring hospitalization for three days. He was initially on a PICC line and has been on amoxicillin  since then as a precautionary measure by the infectious disease specialist.  He works out in a pool at [employer] and suspects the chlorine might be  contributing to his symptoms, as he is sensitive to chemicals. Despite showering after swimming, he still notices the chlorine smell.   Patient Active Problem List   Diagnosis Date Noted   Ganglion of left ankle 05/27/2023   Pre-operative clearance 03/31/2023   Wound infection 03/31/2023   Hx of colonic polyps 12/03/2022   Rectal bleeding 12/03/2022   Need for influenza vaccination 11/08/2022   Blood in stool 11/08/2022   Dyspepsia 11/08/2022   Left hip pain 11/08/2022   Wart of hand 05/09/2022   Pain in finger of right hand 05/09/2022   Sleep apnea 10/02/2021   Loud snoring 10/02/2021   Excessive daytime sleepiness 10/02/2021   Morbid obesity with body mass index (BMI) of 45.0 to 49.9 in adult [facility]) 10/02/2021   Chronic intermittent hypoxia with obstructive sleep apnea 10/02/2021   Severe obstructive sleep apnea-hypopnea syndrome 10/02/2021   Nasal obstruction 10/02/2021   Allergies 06/22/2021   Snoring 05/29/2021   Morbid obesity (HCC) 05/29/2021   Conjunctivitis 08/20/2017   Gastroenteritis 08/20/2017   Onychomycosis 04/08/2017   Acute bacterial sinusitis 12/05/2015   Preventative health care 12/05/2014   S/P left TKA 04/11/2014   Obesity (BMI 30-39.9) 06/05/2013   Spinal stenosis in cervical region 06/05/2013   CTS (carpal tunnel syndrome) 06/05/2013   Myalgia and myositis 06/05/2013   Left knee pain 04/19/2013   Osteoarthritis 10/02/2009   Hyperlipidemia 01/24/2009   SINUSITIS- ACUTE-NOS 10/28/2007   Diverticulosis of colon 08/28/2007   Essential hypertension 08/26/2007  Internal hemorrhoids 08/26/2007   PARESTHESIA, HANDS 08/26/2007   Past Medical History:  Diagnosis Date   Diverticulosis    Dysrhythmia    irregular at one time (07/21/2013)   Gastroenteritis 08/20/2017   H/O echocardiogram 2010   seen by Dr. Pietro- told to return as needed    Hyperlipidemia    Hypertension    Dr, Pietro told pt. in 2010, ? extra beats.   Neuromuscular disorder (HCC)     spinal cord compression, carpal tunnel - R hand  . Left 3 fingers remains with some tingling.   Osteoarthritis    knees and wrists (07/21/2013)   Sleep apnea 10/02/2021   Snores    Past Surgical History:  Procedure Laterality Date   ANTERIOR CERVICAL DECOMP/DISCECTOMY FUSION N/A 07/08/2013   Procedure: ANTERIOR CERVICAL DECOMPRESSION/DISCECTOMY FUSION 2 LEVELS C3-C5;  Surgeon: Donaciano Sprang, MD;  Location: [facility] OR;  Service: Orthopedics;  Laterality: N/A;   ANTERIOR CRUCIATE LIGAMENT REPAIR Left 10/14/1997   CARPAL TUNNEL RELEASE Left 05/14/2013   CARPAL TUNNEL RELEASE Right 07/08/2013   Procedure: LIMITED OPEN RIGHT CARPAL TUNNEL RELEASE;  Surgeon: Elsie Mussel, MD;  Location: [facility] OR;  Service: Orthopedics;  Laterality: Right;   INCISION AND DRAINAGE OF WOUND N/A 07/21/2013   Procedure: IRRIGATION AND DEBRIDEMENT OF CERVICAL WOUND;  Surgeon: Donaciano Sprang, MD;  Location: [facility] OR;  Service: Orthopedics;  Laterality: N/A;   KNEE ARTHROSCOPY Right 10/14/2005   MICRODISCECTOMY LUMBAR  2023   OPEN REDUCTION INTERNAL FIXATION (ORIF) FOOT LISFRANC FRACTURE Right 10/15/1991   R- /w ORIF   TONSILLECTOMY     TOTAL KNEE ARTHROPLASTY Left 04/11/2014   Procedure: LEFT TOTAL KNEE ARTHROPLASTY WITH HARDWARE REMOVAL;  Surgeon: Donnice JONETTA Car, MD;  Location: [facility] ORS;  Service: Orthopedics;  Laterality: Left;   Social History   Tobacco Use   Smoking status: Never   Smokeless tobacco: Never  Vaping Use   Vaping status: Never Used  Substance Use Topics   Alcohol use: Yes    Alcohol/week: 4.0 standard drinks of alcohol    Types: 2 Cans of beer, 2 Shots of liquor per week    Comment: 1 per day   Drug use: No   Social History   Socioeconomic History   Marital status: Married    Spouse name: Billi   Number of children: 2   Years of education: Not on file   Highest education level: Bachelor's degree (e.g., BA, AB, BS)  Occupational History   Occupation: Runner, broadcasting/film/video--- [employer]    Employer: [employer]  Tobacco Use   Smoking status: Never   Smokeless tobacco: Never  Vaping Use   Vaping status: Never Used  Substance and Sexual Activity   Alcohol use: Yes    Alcohol/week: 4.0 standard drinks of alcohol    Types: 2 Cans of beer, 2 Shots of liquor per week    Comment: 1 per day   Drug use: No   Sexual activity: Yes    Partners: Female  Other Topics Concern   Not on file  Social History Narrative   Lives with wife and 2 children   Ambidextrous, eats and writes r handed. Throws and kicks w left hand/foot   Caffeine: 2 cups of coffee a day, diet mtn dew before workout      Social Drivers of Health   Financial Resource Strain: Not on file  Food Insecurity: Low Risk  (10/27/2023)   Received from [facility]   Hunger Vital Sign    Within  the past 12 months, you worried that your food would run out before you got money to buy more: Never true    Within the past 12 months, the food you bought just didn't last and you didn't have money to get more. : Never true  Transportation Needs: No Transportation Needs (10/27/2023)   Received from [facility]    In the past 12 months, has lack of reliable transportation kept you from medical appointments, meetings, work or from getting things needed for daily living? : No  Physical Activity: Not on file  Stress: Not on file  Social Connections: Unknown (02/26/2022)   Received from [facility]   Social Network    Social Network: Not on file  Intimate Partner Violence: Unknown (01/18/2022)   Received from [facility]   HITS    Physically Hurt: Not on file    Insult or Talk Down To: Not on file    Threaten Physical Harm: Not on file    Scream or Curse: Not on file   Family Status  Relation Name Status   Mother  Deceased   Father  Deceased   Brother  Deceased   Brother  Alive   Brother  Alive   Brother  Alive   MGM  Deceased   MGF  Deceased   PGM  Deceased   PGF  Deceased   Daughter  Alive   Son   Alive   Neg Hx  (Not Specified)  No partnership data on file   Family History  Problem Relation Age of Onset   Hypertension Mother    Aneurysm Mother        Brain   Stroke Father    Hypertension Father    Alcohol abuse Father    HIV Brother        passed away from AIDS   Osteoarthritis Brother    Osteoarthritis Brother    Osteoarthritis Brother    Colon cancer Neg Hx    Colon polyps Neg Hx    No Known Allergies    Review of Systems  Constitutional:  Positive for malaise/fatigue. Negative for chills and fever.  HENT:  Positive for congestion, sinus pain and sore throat.   Eyes:  Negative for blurred vision.  Respiratory:  Positive for cough and sputum production. Negative for shortness of breath and wheezing.   Cardiovascular:  Negative for chest pain, palpitations and leg swelling.  Gastrointestinal:  Negative for vomiting.  Musculoskeletal:  Negative for back pain.  Skin:  Positive for itching and rash.  Neurological:  Negative for loss of consciousness and headaches.      Objective:     BP 104/80 (BP Location: Right Arm, Patient Position: Sitting, Cuff Size: Large)   Pulse 72   Temp 98.4 F (36.9 C) (Oral)   Resp 18   Ht 5' 10 (1.778 m)   Wt 272 lb 6.4 oz (123.6 kg)   SpO2 98%   BMI 39.09 kg/m  BP Readings from Last 3 Encounters:  04/20/24 104/80  05/27/23 132/82  05/12/23 100/70   Wt Readings from Last 3 Encounters:  04/20/24 272 lb 6.4 oz (123.6 kg)  10/16/23 252 lb (114.3 kg)  05/27/23 255 lb (115.7 kg)   SpO2 Readings from Last 3 Encounters:  04/20/24 98%  05/12/23 97%  03/31/23 98%      Physical Exam Vitals and nursing note reviewed.  Constitutional:      General: He is not in acute distress.  Appearance: Normal appearance. He is well-developed.  HENT:     Head: Normocephalic and atraumatic.     Nose: Congestion present.     Right Sinus: Maxillary sinus tenderness present.     Left Sinus: Maxillary sinus tenderness present.  Eyes:      General: No scleral icterus.       Right eye: No discharge.        Left eye: No discharge.  Cardiovascular:     Rate and Rhythm: Normal rate and regular rhythm.     Heart sounds: No murmur heard. Pulmonary:     Effort: Pulmonary effort is normal. No respiratory distress.     Breath sounds: Normal breath sounds.  Musculoskeletal:        General: Normal range of motion.     Cervical back: Normal range of motion and neck supple.     Right lower leg: No edema.     Left lower leg: No edema.  Lymphadenopathy:     Cervical: No cervical adenopathy.  Skin:    General: Skin is warm and dry.     Findings: Erythema and rash present.         Comments: Perioral rash---  raised , erythema   Neurological:     Mental Status: He is alert and oriented to person, place, and time.  Psychiatric:        Mood and Affect: Mood normal.        Behavior: Behavior normal.        Thought Content: Thought content normal.        Judgment: Judgment normal.      No results found for any visits on 04/20/24.  Last CBC Lab Results  Component Value Date   WBC 7.0 11/08/2022   HGB 14.5 11/08/2022   HCT 41.9 11/08/2022   MCV 94.2 11/08/2022   MCH 32.6 11/08/2022   RDW 12.6 11/08/2022   PLT 248 11/08/2022   Last metabolic panel Lab Results  Component Value Date   GLUCOSE 112 (H) 05/12/2023   NA 138 05/12/2023   K 4.5 05/12/2023   CL 106 05/12/2023   CO2 25 05/12/2023   BUN 21 05/12/2023   CREATININE 0.94 05/12/2023   GFR 88.33 05/12/2023   CALCIUM  8.9 05/12/2023   PHOS 4.0 08/26/2007   PROT 5.8 (L) 05/12/2023   ALBUMIN 4.1 05/12/2023   BILITOT 0.7 05/12/2023   ALKPHOS 53 05/12/2023   AST 21 05/12/2023   ALT 22 05/12/2023   Last lipids Lab Results  Component Value Date   CHOL 193 05/12/2023   HDL 48.20 05/12/2023   LDLCALC 133 (H) 05/12/2023   LDLDIRECT 159.5 12/09/2011   TRIG 57.0 05/12/2023   CHOLHDL 4 05/12/2023   Last hemoglobin A1c Lab Results  Component Value Date    HGBA1C 5.5 06/04/2013   Last thyroid  functions Lab Results  Component Value Date   TSH 1.67 11/08/2022   Last vitamin D  No results found for: MARIEN BOLLS, VD25OH Last vitamin B12 and Folate Lab Results  Component Value Date   VITAMINB12 351 06/04/2013      The 10-year ASCVD risk score (Arnett DK, et al., 2019) is: 7.6%    Assessment & Plan:   Problem List Items Addressed This Visit       Unprioritized   SINUSITIS- ACUTE-NOS   Relevant Medications   amoxicillin -clavulanate (AUGMENTIN ) 875-125 MG tablet   fluticasone  (FLONASE ) 50 MCG/ACT nasal spray   Other Visit Diagnoses       Tinea  corporis    -  Primary   Relevant Medications   clotrimazole -betamethasone  (LOTRISONE ) cream     Assessment and Plan Assessment & Plan Facial Dermatitis   He presents with a facial rash and pruritus around the CPAP mask area, persisting despite discontinuing CPAP use for a week. A yeast infection is suspected, likely exacerbated by moisture from drooling during sleep. Neosporin and cortisone cream have been ineffective. Prescribe a combination cream with steroid and antifungal properties for application around the mouth. Advise to continue avoiding CPAP use until the rash resolves. Suggest exploring different CPAP mask options or ointments to prevent future irritation.  Sinusitis   He exhibits symptoms consistent with a sinus infection, including nasal drainage, sore throat, and occasional cough, but is afebrile. Symptoms began during a trip to [place] . Currently on low-dose amoxicillin  for a previous knee infection, which may not be optimal for treating sinusitis. Chlorine exposure from pool workouts may contribute to symptoms. Prescribe Augmentin  (amoxicillin  clavulanic acid) for 7-10 days to treat sinusitis. Advise to resume plain amoxicillin  after completing the Augmentin  course. Prescribe Flonase  for nasal congestion.  Knee Infection Prophylaxis   He is on long-term  amoxicillin  following knee replacement surgery in December, complicated by an incision infection requiring hospitalization. An infectious disease specialist recommended ongoing amoxicillin  prophylaxis. Continue amoxicillin  prophylaxis after completing the Augmentin  course for sinusitis.    Return if symptoms worsen or fail to improve.    Jayme Cham R Lowne Chase, DO

## 2024-04-20 NOTE — Patient Instructions (Signed)
 Rash, Adult A rash is a breakout of spots or blotches on the skin. It can affect the way the skin looks and feels. Many things can cause a rash. Common causes include: Viral infections. These include colds, measles, and hand, foot, and mouth disease. Bacterial infections. These include scarlet fever and impetigo. Fungal infections. These include athlete's foot, ringworm, and yeast rashes. Skin irritation. This may be from heat rash, exposure to moisture or friction for a long time (intertrigo), or exposure to soap or skin care products (eczema). Allergic reactions. These may be caused by foods, medicines, or things like poison ivy. Some rashes may go away after a few days. Others may last for a few weeks. The goal of treatment is to stop the itching and keep the rash from spreading. Follow these instructions at home: Medicine Take or apply over-the-counter and prescription medicines only as told by your health care provider. These may include: Corticosteroids. These can help treat red or swollen skin. They may be given as creams or as medicines to take by mouth (orally). Anti-itch lotions. Allergy medicines. Pain medicine. Antifungal medicine if the rash is from a fungal infection. Antibiotics if you have an infection.  Skin care Apply cool, wet cloths (compresses) to the affected areas. Do not scratch or rub your skin. Avoid covering the rash. Keep it exposed to air as often as you can. Managing itching and discomfort Avoid hot showers and baths. These can make itching worse. A cold shower may help. Try taking a bath with: Epsom salts. You can get these at your local pharmacy or grocery store. Follow the instructions on the package. Baking soda. Pour a small amount into the bath as told by your provider. Colloidal oatmeal. You can get this at your local pharmacy or grocery store. Follow the instructions on the package. Try putting baking soda paste on your skin. Stir water into baking  soda until it becomes like a paste. Try using calamine lotion or cortisone cream to help with itchiness. Keep cool. Stay out of the sun. Sweating and being hot can make itching worse. General instructions  Rest as needed. Drink enough fluid to keep your pee (urine) pale yellow. Wear loose-fitting clothes. Avoid scented soaps, detergents, and perfumes. Use gentle soaps, detergents, perfumes, and cosmetics. Avoid the things that cause your rash (triggers). Keep a journal to help keep track of your triggers. Write down: What you eat. What cosmetics you use. What you drink. What you wear. This includes jewelry. Contact a health care provider if: You sweat at night more than normal. You pee (urinate) more or less than normal, or your pee is a darker color than normal. Your eyes become sensitive to light. Your skin or the white parts of your eyes turn yellow (jaundice). Your skin tingles or is numb. You get painful blisters in your nose or mouth. Your rash does not go away after a few days, or it gets worse. You are more tired or thirsty than normal. You have new or worse symptoms. These may include: Pain in your abdomen. Fever. Diarrhea or vomiting. Weakness or weight loss. Get help right away if: You get confused. You have a severe headache, a stiff neck, or severe joint pain or stiffness. You become very sleepy or not responsive. You have a seizure. This information is not intended to replace advice given to you by your health care provider. Make sure you discuss any questions you have with your health care provider. Document Revised: 07/19/2022 Document Reviewed:  07/19/2022 Elsevier Patient Education  2024 [employer].

## 2024-04-28 ENCOUNTER — Telehealth: Payer: Self-pay

## 2024-04-28 DIAGNOSIS — B354 Tinea corporis: Secondary | ICD-10-CM

## 2024-04-28 MED ORDER — CLOTRIMAZOLE-BETAMETHASONE 1-0.05 % EX CREA: 1.0000 | TOPICAL_CREAM | Freq: Every day | CUTANEOUS | 0 refills | Status: DC

## 2024-04-28 NOTE — Telephone Encounter (Signed)
 Copied from CRM [id number]. Topic: Clinical - Medical Advice >> Apr 28, 2024  8:31 AM Thersia BROCKS wrote: Reason for CRM: Patient called in regarding his prescription  clotrimazole -betamethasone  (LOTRISONE ) cream, stated it is working alil but he feels he may need another refill , wanted to know if he could get a refill or if he needs to come in to be seen

## 2024-04-28 NOTE — Telephone Encounter (Signed)
 Rx sent.

## 2024-05-01 ENCOUNTER — Other Ambulatory Visit: Payer: Self-pay | Admitting: Family Medicine

## 2024-05-01 DIAGNOSIS — I10 Essential (primary) hypertension: Secondary | ICD-10-CM

## 2024-05-20 ENCOUNTER — Telehealth (INDEPENDENT_AMBULATORY_CARE_PROVIDER_SITE_OTHER): Admitting: Family Medicine

## 2024-05-20 DIAGNOSIS — R21 Rash and other nonspecific skin eruption: Secondary | ICD-10-CM

## 2024-05-20 DIAGNOSIS — B354 Tinea corporis: Secondary | ICD-10-CM | POA: Diagnosis not present

## 2024-05-20 MED ORDER — CLOTRIMAZOLE-BETAMETHASONE 1-0.05 % EX CREA: 1.0000 | TOPICAL_CREAM | Freq: Every day | CUTANEOUS | 0 refills | Status: DC

## 2024-05-20 NOTE — Progress Notes (Signed)
 MyChart Video Visit    Virtual Visit via Video Note   This patient is at least at moderate risk for  without adequate follow up. This format is felt to be most appropriate for this patient at this time. Physical exam was limited by quality of the video and audio technology used for the visit. [name] was able to get the patient set up on a video visit.  Patient location: car Patient and provider in visit Provider location: Office  I discussed the limitations of evaluation and management by telemedicine and the availability of in person appointments. The patient expressed understanding and agreed to proceed.  Visit Date: 05/20/2024  Today's healthcare provider: Jamee JONELLE Antonio Cyndee, DO     Subjective:    Patient ID: Shane Haney, male    DOB: December 21, 1960, 63 y.o.   MRN: [id number]  Chief Complaint  Patient presents with   Rash    HPI Patient is in today for rash.  Discussed the use of AI scribe software for clinical note transcription with the patient, who gave verbal consent to proceed.  History of Present Illness Shane Haney is a 63 year old male who presents with persistent skin itching and rash.  He has been experiencing ongoing skin itching and rash, describing it as 'breaking out and being itchy and stuff.' The symptoms initially improved with a combination of steroid and antifungal cream, but have since recurred. The itching is particularly severe after swimming in a chlorinated pool and after shaving, especially around the CPAP mask area on his face.  He recently saw his infectious disease doctor for a follow-up on his knee infection, for which he is on amoxicillin . The doctor provided mupirocin ointment 2% for the skin issue. He has not seen a dermatologist before.  He is concerned that the itching might be related to chlorine exposure, as he has been swimming regularly for a long time without previous issues. He also wonders if his dogs might have  fleas, contributing to the problem. He mentions developing little bumps on his arm when the issue first started.  He has been using a CPAP mask for two years and questions if he might have developed an allergy to it. He also notes significant sun exposure this year, which he wonders might be contributing to the skin issues in combination with chlorine exposure.  He is considering using a strong lotion for very dry skin, which is unscented and for sensitive skin, to help with the dryness.  No previous issues with chlorine sensitivity. Reports itching and rash, particularly after swimming and shaving.    Past Medical History:  Diagnosis Date   Diverticulosis    Dysrhythmia    irregular at one time (07/21/2013)   Gastroenteritis 08/20/2017   H/O echocardiogram 2010   seen by Dr. Pietro- told to return as needed    Hyperlipidemia    Hypertension    Dr, Pietro told pt. in 2010, ? extra beats.   Neuromuscular disorder (HCC)    spinal cord compression, carpal tunnel - R hand  . Left 3 fingers remains with some tingling.   Osteoarthritis    knees and wrists (07/21/2013)   Sleep apnea 10/02/2021   Snores     Past Surgical History:  Procedure Laterality Date   ANTERIOR CERVICAL DECOMP/DISCECTOMY FUSION N/A 07/08/2013   Procedure: ANTERIOR CERVICAL DECOMPRESSION/DISCECTOMY FUSION 2 LEVELS C3-C5;  Surgeon: Donaciano Sprang, MD;  Location: [facility] OR;  Service: Orthopedics;  Laterality: N/A;  ANTERIOR CRUCIATE LIGAMENT REPAIR Left 10/14/1997   CARPAL TUNNEL RELEASE Left 05/14/2013   CARPAL TUNNEL RELEASE Right 07/08/2013   Procedure: LIMITED OPEN RIGHT CARPAL TUNNEL RELEASE;  Surgeon: Elsie Mussel, MD;  Location: [facility] OR;  Service: Orthopedics;  Laterality: Right;   INCISION AND DRAINAGE OF WOUND N/A 07/21/2013   Procedure: IRRIGATION AND DEBRIDEMENT OF CERVICAL WOUND;  Surgeon: Donaciano Sprang, MD;  Location: [facility] OR;  Service: Orthopedics;  Laterality: N/A;   KNEE ARTHROSCOPY Right 10/14/2005    MICRODISCECTOMY LUMBAR  2023   OPEN REDUCTION INTERNAL FIXATION (ORIF) FOOT LISFRANC FRACTURE Right 10/15/1991   R- /w ORIF   TONSILLECTOMY     TOTAL KNEE ARTHROPLASTY Left 04/11/2014   Procedure: LEFT TOTAL KNEE ARTHROPLASTY WITH HARDWARE REMOVAL;  Surgeon: Donnice JONETTA Car, MD;  Location: [facility] ORS;  Service: Orthopedics;  Laterality: Left;    Family History  Problem Relation Age of Onset   Hypertension Mother    Aneurysm Mother        Brain   Stroke Father    Hypertension Father    Alcohol abuse Father    HIV Brother        passed away from AIDS   Osteoarthritis Brother    Osteoarthritis Brother    Osteoarthritis Brother    Colon cancer Neg Hx    Colon polyps Neg Hx     Social History   Socioeconomic History   Marital status: Married    Spouse name: Billi   Number of children: 2   Years of education: Not on file   Highest education level: Bachelor's degree (e.g., BA, AB, BS)  Occupational History   Occupation: Runner, broadcasting/film/video--- [employer]    Employer: [employer]  Tobacco Use   Smoking status: Never   Smokeless tobacco: Never  Vaping Use   Vaping status: Never Used  Substance and Sexual Activity   Alcohol use: Yes    Alcohol/week: 4.0 standard drinks of alcohol    Types: 2 Cans of beer, 2 Shots of liquor per week    Comment: 1 per day   Drug use: No   Sexual activity: Yes    Partners: Female  Other Topics Concern   Not on file  Social History Narrative   Lives with wife and 2 children   Ambidextrous, eats and writes r handed. Throws and kicks w left hand/foot   Caffeine: 2 cups of coffee a day, diet mtn dew before workout      Social Drivers of Health   Financial Resource Strain: Not on file  Food Insecurity: Low Risk  (10/27/2023)   Received from [facility]   Hunger Vital Sign    Within the past 12 months, you worried that your food would run out before you got money to buy more: Never true    Within the past 12 months, the food you bought just didn't  last and you didn't have money to get more. : Never true  Transportation Needs: No Transportation Needs (10/27/2023)   Received from [facility]    In the past 12 months, has lack of reliable transportation kept you from medical appointments, meetings, work or from getting things needed for daily living? : No  Physical Activity: Not on file  Stress: Not on file  Social Connections: Unknown (02/26/2022)   Received from [facility]   Social Network    Social Network: Not on file  Intimate Partner Violence: Unknown (01/18/2022)   [facility] from [facility]  HITS    Physically Hurt: Not on file    Insult or Talk Down To: Not on file    Threaten Physical Harm: Not on file    Scream or Curse: Not on file    Outpatient Medications Prior to Visit  Medication Sig Dispense Refill   diclofenac  (VOLTAREN ) 75 MG EC tablet TAKE 1 TABLET(75 MG) BY MOUTH TWICE DAILY 60 tablet 2   fluticasone  (FLONASE ) 50 MCG/ACT nasal spray Place 2 sprays into both nostrils daily. 16 g 6   gabapentin  (NEURONTIN ) 300 MG capsule Take 1 capsule (300 mg total) by mouth 2 (two) times daily. 180 capsule 1   Multiple Vitamin (MULTIVITAMIN WITH MINERALS) TABS tablet Take 1 tablet by mouth daily.     valsartan  (DIOVAN ) 320 MG tablet Take 1 tablet (320 mg total) by mouth daily. 90 tablet 0   amoxicillin -clavulanate (AUGMENTIN ) 875-125 MG tablet Take 1 tablet by mouth 2 (two) times daily. 20 tablet 0   clotrimazole -betamethasone  (LOTRISONE ) cream Apply 1 Application topically daily. 30 g 0   No facility-administered medications prior to visit.    No Known Allergies  Review of Systems  Constitutional:  Negative for fever and malaise/fatigue.  HENT:  Negative for congestion.   Eyes:  Negative for blurred vision.  Respiratory:  Negative for shortness of breath.   Cardiovascular:  Negative for chest pain, palpitations and leg swelling.  Gastrointestinal:  Negative for abdominal pain, blood in stool and  nausea.  Genitourinary:  Negative for dysuria and frequency.  Musculoskeletal:  Negative for falls.  Skin:  Negative for rash.  Neurological:  Negative for dizziness, loss of consciousness and headaches.  Endo/Heme/Allergies:  Negative for environmental allergies.  Psychiatric/Behavioral:  Negative for depression. The patient is not nervous/anxious.        Objective:    Physical Exam Vitals and nursing note reviewed.  Constitutional:      Appearance: He is well-developed.  Eyes:     General:        Right eye: No discharge.  Neurological:     Mental Status: He is oriented to person, place, and time.  Psychiatric:        Mood and Affect: Mood normal.        Behavior: Behavior normal.        Thought Content: Thought content normal.        Judgment: Judgment normal.     There were no vitals taken for this visit. Wt Readings from Last 3 Encounters:  04/20/24 272 lb 6.4 oz (123.6 kg)  10/16/23 252 lb (114.3 kg)  05/27/23 255 lb (115.7 kg)       Assessment & Plan:  Rash -     Ambulatory referral to Dermatology  Tinea corporis -     Clotrimazole -Betamethasone ; Apply 1 Application topically daily.  Dispense: 30 g; Refill: 0   Assessment and Plan Assessment & Plan Chronic pruritus and recurrent skin rash   Chronic pruritus and recurrent skin rash may be exacerbated by chlorine exposure from swimming. Possible causes include an allergic reaction to chlorine, flea bites, or irritation from a CPAP mask. Symptoms worsen after shaving and swimming. Previous treatment with a steroid and antifungal cream provided temporary relief. A new mupirocin ointment has been prescribed by an infectious disease specialist. Use mupirocin ointment as prescribed. Refill and use the combination steroid and antifungal cream. Refer to a dermatologist for further evaluation. Consider using unscented lotion for sensitive skin if dryness occurs.  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 Follow-up with an infectious disease specialist is ongoing. Continue amoxicillin  as prescribed and follow up with the specialist as scheduled.    I discussed the assessment and treatment plan with the patient. The patient was provided an opportunity to ask questions and all were answered. The patient agreed with the plan and demonstrated an understanding of the instructions.   The patient was advised to call back or seek an in-person evaluation if the symptoms worsen or if the condition fails to improve as anticipated.  Jamee JONELLE Antonio Cyndee, DO [facility] [phone] (phone) [phone] (fax)  [facility]

## 2024-06-07 ENCOUNTER — Telehealth: Payer: Self-pay | Admitting: Family Medicine

## 2024-06-07 ENCOUNTER — Other Ambulatory Visit: Payer: Self-pay | Admitting: Family Medicine

## 2024-06-07 DIAGNOSIS — R21 Rash and other nonspecific skin eruption: Secondary | ICD-10-CM

## 2024-06-07 DIAGNOSIS — B354 Tinea corporis: Secondary | ICD-10-CM

## 2024-06-07 MED ORDER — CLOTRIMAZOLE-BETAMETHASONE 1-0.05 % EX CREA: 1.0000 | TOPICAL_CREAM | Freq: Every day | CUTANEOUS | 0 refills | Status: AC

## 2024-06-07 NOTE — Telephone Encounter (Signed)
 Copied from CRM [id number]. Topic: Clinical - Medication Question >> Jun 07, 2024  8:06 AM [name] wrote: Reason for CRM: Patient is still having issue with rash on face and would like to know if Dr Antonio Meth could prescribe  clotrimazole -betamethasone  (LOTRISONE ) cream again as well as and would like a new dermatologist referral last referral isn't available until January, please give patient a call 1st and if isn;t available leave message in Shane Haney

## 2024-07-21 ENCOUNTER — Telehealth: Payer: Self-pay | Admitting: Adult Health

## 2024-07-21 DIAGNOSIS — G4733 Obstructive sleep apnea (adult) (pediatric): Secondary | ICD-10-CM

## 2024-07-21 DIAGNOSIS — R634 Abnormal weight loss: Secondary | ICD-10-CM

## 2024-07-21 NOTE — Telephone Encounter (Signed)
 Pt is asking for a call to discuss options other than CPAP as a result of on again off again rash where mask fits.

## 2024-07-21 NOTE — Telephone Encounter (Signed)
 Previously discussed repeat sleep study once at goal weight.  If he is at goal weight and would like to repeat sleep study, please let me know and I can place an order prior to f/u visit. Will just need updated weight. Thank you.

## 2024-07-21 NOTE — Telephone Encounter (Signed)
 I called pt.  He has not been in touch with DME yet.  But he has noted since mid June rash around mask, (break out, or slits ).  Has been on ABX amoxicillin  for knee replacement this till 09/2024.  Has been to dermatology.  Has been on cpap for years.  I told him that would call DME first get there recommendations.  He does have order for supplies.  But if needs anything additional like mask refit, may need order.  He appreciated advice and will call back as needed.  He also stated that he has lost 65lbs over time and asked about another sleep study.   He has an appt in January we can see about moving up if needed.

## 2024-07-30 ENCOUNTER — Telehealth: Payer: Self-pay

## 2024-07-30 ENCOUNTER — Other Ambulatory Visit: Payer: Self-pay | Admitting: Family Medicine

## 2024-07-30 DIAGNOSIS — I10 Essential (primary) hypertension: Secondary | ICD-10-CM

## 2024-07-30 MED ORDER — VALSARTAN 320 MG PO TABS: 320.0000 mg | ORAL_TABLET | Freq: Every day | ORAL | 0 refills | Status: DC

## 2024-07-30 NOTE — Telephone Encounter (Signed)
 Copied from CRM #[id number]. Topic: Clinical - Medication Question >> Jul 30, 2024  9:50 AM Revonda D wrote: Reason for CRM: Pt just submitted a medication refill request for the valsartan  (DIOVAN ) 320 MG tablet and stated that he is currently out of the medication. Pt is requesting a short supply until the request is approved or he would like for the request to be approved today if possible. Pt would like a callback with an update.

## 2024-07-30 NOTE — Telephone Encounter (Signed)
 Copied from CRM #[id number]. Topic: Clinical - Medication Refill >> Jul 30, 2024  9:46 AM Revonda D wrote: Medication: valsartan  (DIOVAN ) 320 MG tablet  Has the patient contacted their pharmacy? Yes (Agent: If no, request that the patient contact the pharmacy for the refill. If patient does not wish to contact the pharmacy document the reason why and proceed with request.) (Agent: If yes, when and what did the pharmacy advise?)  This is the patient's preferred pharmacy:  [id number], [place] - [address] AT [facility] [address] [place] [address] Phone: [phone] Fax: [phone]  Is this the correct pharmacy for this prescription? Yes If no, delete pharmacy and type the correct one.   Has the prescription been filled recently? No  Is the patient out of the medication? Yes  Has the patient been seen for an appointment in the last year OR does the patient have an upcoming appointment? Yes  Can we respond through MyChart? Yes  Agent: Please be advised that Rx refills may take up to 3 business days. We ask that you follow-up with your pharmacy.

## 2024-07-30 NOTE — Telephone Encounter (Signed)
 Refill sent.

## 2024-07-31 ENCOUNTER — Other Ambulatory Visit: Payer: Self-pay | Admitting: Family Medicine

## 2024-07-31 DIAGNOSIS — I10 Essential (primary) hypertension: Secondary | ICD-10-CM

## 2024-08-09 NOTE — Addendum Note (Signed)
 Addended by: WHITFIELD RAISIN L on: 08/09/2024 01:44 PM   Modules accepted: Orders

## 2024-08-20 ENCOUNTER — Ambulatory Visit: Admitting: Neurology

## 2024-08-20 DIAGNOSIS — G4733 Obstructive sleep apnea (adult) (pediatric): Secondary | ICD-10-CM

## 2024-08-20 DIAGNOSIS — R634 Abnormal weight loss: Secondary | ICD-10-CM

## 2024-09-22 ENCOUNTER — Other Ambulatory Visit: Payer: Self-pay | Admitting: Family Medicine

## 2024-09-22 DIAGNOSIS — G8929 Other chronic pain: Secondary | ICD-10-CM

## 2024-09-22 NOTE — Telephone Encounter (Signed)
 Copied from CRM #[id number]. Topic: Clinical - Medication Refill >> Sep 22, 2024  3:30 PM Eva FALCON wrote: Medication:  diclofenac  (VOLTAREN ) 75 MG EC tablet    Has the patient contacted their pharmacy? No, states bottle says no refills. (Agent: If no, request that the patient contact the pharmacy for the refill. If patient does not wish to contact the pharmacy document the reason why and proceed with request.) (Agent: If yes, when and what did the pharmacy advise?)  This is the patient's preferred pharmacy:  [facility] #[id number] [facility], [place] - [address] AT [facility] [address] [place] [address] Phone: [phone] Fax: [phone]  Is this the correct pharmacy for this prescription? Yes If no, delete pharmacy and type the correct one.   Has the prescription been filled recently? Yes  Is the patient out of the medication? No  Has the patient been seen for an appointment in the last year OR does the patient have an upcoming appointment? Yes  Can we respond through MyChart? Yes  Agent: Please be advised that Rx refills may take up to 3 business days. We ask that you follow-up with your pharmacy.

## 2024-09-23 MED ORDER — DICLOFENAC SODIUM 75 MG PO TBEC: 75.0000 mg | DELAYED_RELEASE_TABLET | Freq: Two times a day (BID) | ORAL | 2 refills | Status: AC

## 2024-09-23 NOTE — Progress Notes (Signed)
 [name] 63 year old male [birth year]     HOME SLEEP TEST REPORT ( by Elene  mail -out device )      STUDY DATE:  officially 08-20-2024, but data were loaded on 09-23-2024  Data received : 09-23-2024    ORDERING CLINICIAN:  Harlene Bogaert, NP at [facility] REFERRING CLINICIAN: Jamee Shanks- Chase   CLINICAL INFORMATION/HISTORY: OSA on CPAP, last visit on 10-16-2023, Video visit.  ASSESSMENT/PLAN: Shane Haney is a 63 y.o. year old male with diagnosis of severe sleep apnea with total AHI 103.3/h per split-night study 09/30/2021. 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          Severe OSA: Compliance report shows excellent usage and optimal residual AHI.  Continue current pressure settings at 5-20 with EPR level 2. Congratulated on weight loss and highly encouraged he continue to do what he has been doing. Can consider repeat sleep study once at goal weight (about 25lbs to go) which would be able 100lb weight loss since initial sleep study!  Discussed importance of continued nightly usage and ensuring greater than 4 hours per night for optimal benefit and for insurance requirements.  Continue to follow with DME    Epworth sleepiness score: 0/24. FFS at XYZ  / 63 points   BMI: 36 kg/m  Neck Circumference: NA  Sleep Summary:   Begin of Recording Time (hours, min): 09-14-2024 , 22.01 hours         Total Sleep Time (hours, min):   6 h 27   Sleep efficiency %;  78%                                     Respiratory Indices * (Apnea Hypopnea Index) by AASM criteria of scoring;  total AHI was 43.2/h                    Positional  respiratory activity  / snoring : Loud interval snoring.  Highly fragmented sleep. Prone sleep.   Oxygen Saturation  during Sleep    Oxygen Saturation (%) Mean: 91.7 % , between  70% ( nadir)  and 100 %                  O2 Saturation (minutes)  <89%:   32 minutes        Pulse Rate during Sleep :   Pulse Mean (bpm):  59 bpm , between 50 and 75 bpm in NSR,                 IMPRESSION:  This HST confirms the ongoing presence of severe sleep apnea, with an Apnea Hypopnea Index of  43.2/h. Continuous CPAP therapy is recommended and needed. The patient is PAP- therapy dependent.    RECOMMENDATION:  new auto-CPAP S 11 ResMed device with a setting from 10-20 cm water pressure, ramp time of 10 minutes and  no EPR.  The mask of patient's choice can be continued.    Any Patient endorsing a high level of sleepiness should be cautioned not to drive, work at heights, or operate dangerous machinery or heavy equipment when tired or sleepy.  Review of good sleep hygiene measures took place in the initial consultation but should be revisited ( Your guide to better sleep  a publication by the NIH is a good source  of information).   The referring provider will be notified of the test results.    I certify that I have reviewed the raw data recording prior to the issuance of this report in accordance with the standards of the American Academy of Sleep Medicine (AASM).    INTERPRETING PHYSICIAN:  Dedra Gores, MD, [facility] [place] [facility] [place] [facility] Board certified by [employer] of Sleep Medicine and Diplomate of the [employer] of Sleep Medicine. Board certified In Neurology through the ABPN, Fellow of the [employer] of Neu[employer].

## 2024-09-25 NOTE — Procedures (Signed)
 [name] 63 year old male [birth year]     HOME SLEEP TEST REPORT ( by Elene  mail -out device )      STUDY DATE:  officially 08-20-2024, but data were loaded on 09-23-2024  Data received : 09-23-2024    ORDERING CLINICIAN:  Harlene Bogaert, NP at [facility] REFERRING CLINICIAN: Jamee Shanks- [place]   CLINICAL INFORMATION/HISTORY: OSA on CPAP, last visit on 10-16-2023, Video visit.  ASSESSMENT/PLAN: Shane Haney is a 63 y.o. year old male with diagnosis of severe sleep apnea with total AHI 103.3/h per split-night study 09/30/2021. 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          Severe OSA: Compliance report shows excellent usage and optimal residual AHI.  Continue current pressure settings at 5-20 with EPR level 2. Congratulated on weight loss and highly encouraged he continue to do what he has been doing. Can consider repeat sleep study once at goal weight (about 25lbs to go) which would be able 100lb weight loss since initial sleep study!  Discussed importance of continued nightly usage and ensuring greater than 4 hours per night for optimal benefit and for insurance requirements.  Continue to follow with DME    Epworth sleepiness score: 0/24. FFS at XYZ  / 63 points   BMI: 36 kg/m  Neck Circumference: NA  Sleep Summary:   Begin of Recording Time (hours, min): 09-14-2024 , 22.01 hours         Total Sleep Time (hours, min):   6 h 27   Sleep efficiency %;  78%                                     Respiratory Indices * (Apnea Hypopnea Index) by AASM criteria of scoring;  total AHI was 43.2/h                    Positional  respiratory activity  / snoring : Loud interval snoring.  Highly fragmented sleep. Prone sleep.   Oxygen Saturation  during Sleep    Oxygen Saturation (%) Mean: 91.7 % , between  70% ( nadir)  and 100 %                  O2 Saturation (minutes)  <89%:   32 minutes        Pulse Rate during Sleep :   Pulse Mean (bpm):  59 bpm , between 50 and 75 bpm in NSR,                 IMPRESSION:  This HST confirms the ongoing presence of severe sleep apnea, with an Apnea Hypopnea Index of  43.2/h. Continuous CPAP therapy is recommended and needed. The patient is PAP- therapy dependent.    RECOMMENDATION:  new auto-CPAP S 11 ResMed device with a setting from 10-20 cm water pressure, ramp time of 10 minutes and  no EPR.  The mask of patient's choice can be continued.    Any Patient endorsing a high level of sleepiness should be cautioned not to drive, work at heights, or operate dangerous machinery or heavy equipment when tired or sleepy.  Review of good sleep hygiene measures took place in the initial consultation but should be revisited ( Your guide to better sleep  a publication by the NIH is a good source  of information).   The referring provider will be notified of the test results.    I certify that I have reviewed the raw data recording prior to the issuance of this report in accordance with the standards of the American Academy of Sleep Medicine (AASM).    INTERPRETING PHYSICIAN:  Dedra Gores, MD, [facility] [place] [facility] [place] [facility] Board certified by [employer] of Sleep Medicine and Diplomate of the [employer] of Sleep Medicine. Board certified In Neurology through the ABPN, Fellow of the [employer] of Neu[employer].

## 2024-09-27 ENCOUNTER — Ambulatory Visit: Payer: Self-pay | Admitting: Adult Health

## 2024-10-19 ENCOUNTER — Telehealth: Payer: Self-pay | Admitting: Adult Health

## 2024-10-19 NOTE — Telephone Encounter (Signed)
 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 I went to reschedule him and he asked if appt was even needed, states he's doing well and continuing to use his CPAP and doesn't feel he needs an appt, but wonders if appt is needed for insurance purposes. Patient would like a c/b, I was not sure the stipulations of this

## 2024-10-19 NOTE — Telephone Encounter (Signed)
 Please advise patient that it is required that he keep his appointments for insurance to pay for the machine. Can you find an available appointment that works with the patient if he is unable to keep his original appointment? mat

## 2024-10-19 NOTE — Telephone Encounter (Signed)
 Phone rep called pt, left a vm asking pt returns call to r/s his CPAP f/u, office # left on vm

## 2024-10-20 NOTE — Telephone Encounter (Signed)
 Patient rescheduled appointment on 12/02/24 at 4:00pm

## 2024-10-21 ENCOUNTER — Telehealth: Payer: 59 | Admitting: Adult Health

## 2024-10-26 ENCOUNTER — Other Ambulatory Visit: Payer: Self-pay | Admitting: Family Medicine

## 2024-10-26 DIAGNOSIS — I10 Essential (primary) hypertension: Secondary | ICD-10-CM

## 2024-10-27 ENCOUNTER — Telehealth: Payer: Self-pay

## 2024-10-27 NOTE — Telephone Encounter (Signed)
 Cpap orders were faxed to [facility] on 10/27/2024

## 2024-11-09 ENCOUNTER — Ambulatory Visit: Admitting: Physician Assistant

## 2024-11-16 ENCOUNTER — Other Ambulatory Visit: Payer: Self-pay | Admitting: Family Medicine

## 2024-11-16 DIAGNOSIS — J0140 Acute pansinusitis, unspecified: Secondary | ICD-10-CM

## 2024-12-02 ENCOUNTER — Telehealth: Admitting: Adult Health

## 2024-12-02 ENCOUNTER — Ambulatory Visit: Admitting: Physician Assistant
